# Patient Record
Sex: Male | Born: 1975
Health system: Southern US, Community
[De-identification: ages and names within clinical notes are randomized; demographics above are authoritative.]

## PROBLEM LIST (undated history)

## (undated) ENCOUNTER — Emergency Department (HOSPITAL_COMMUNITY): Payer: BC Managed Care – PPO

## (undated) DIAGNOSIS — G8929 Other chronic pain: Secondary | ICD-10-CM

## (undated) DIAGNOSIS — F419 Anxiety disorder, unspecified: Secondary | ICD-10-CM

## (undated) DIAGNOSIS — F329 Major depressive disorder, single episode, unspecified: Secondary | ICD-10-CM

## (undated) DIAGNOSIS — R519 Headache, unspecified: Secondary | ICD-10-CM

## (undated) DIAGNOSIS — E559 Vitamin D deficiency, unspecified: Secondary | ICD-10-CM

## (undated) DIAGNOSIS — M419 Scoliosis, unspecified: Secondary | ICD-10-CM

## (undated) DIAGNOSIS — M549 Dorsalgia, unspecified: Secondary | ICD-10-CM

## (undated) DIAGNOSIS — G4700 Insomnia, unspecified: Secondary | ICD-10-CM

## (undated) DIAGNOSIS — F32A Depression, unspecified: Secondary | ICD-10-CM

## (undated) DIAGNOSIS — F1910 Other psychoactive substance abuse, uncomplicated: Secondary | ICD-10-CM

## (undated) DIAGNOSIS — R51 Headache: Secondary | ICD-10-CM

## (undated) DIAGNOSIS — U071 COVID-19: Secondary | ICD-10-CM

## (undated) HISTORY — PX: OTHER SURGICAL HISTORY: SHX169

## (undated) HISTORY — DX: Other chronic pain: G89.29

## (undated) HISTORY — DX: Major depressive disorder, single episode, unspecified: F32.9

## (undated) HISTORY — DX: Vitamin D deficiency, unspecified: E55.9

## (undated) HISTORY — DX: Dorsalgia, unspecified: M54.9

## (undated) HISTORY — DX: Insomnia, unspecified: G47.00

## (undated) HISTORY — DX: Scoliosis, unspecified: M41.9

## (undated) HISTORY — DX: COVID-19: U07.1

## (undated) HISTORY — DX: Depression, unspecified: F32.A

## (undated) HISTORY — DX: Anxiety disorder, unspecified: F41.9

## (undated) HISTORY — DX: Headache, unspecified: R51.9

## (undated) HISTORY — DX: Other psychoactive substance abuse, uncomplicated: F19.10

## (undated) HISTORY — DX: Headache: R51

---

## 1998-10-05 ENCOUNTER — Encounter: Admission: RE | Admit: 1998-10-05 | Discharge: 1998-10-28 | Payer: Self-pay | Admitting: Family Medicine

## 2003-06-29 ENCOUNTER — Emergency Department (HOSPITAL_COMMUNITY): Admission: EM | Admit: 2003-06-29 | Discharge: 2003-06-29 | Payer: Self-pay | Admitting: Emergency Medicine

## 2003-06-30 ENCOUNTER — Ambulatory Visit (HOSPITAL_BASED_OUTPATIENT_CLINIC_OR_DEPARTMENT_OTHER): Admission: RE | Admit: 2003-06-30 | Discharge: 2003-06-30 | Payer: Self-pay | Admitting: Orthopedic Surgery

## 2005-07-28 ENCOUNTER — Emergency Department: Payer: Self-pay | Admitting: Emergency Medicine

## 2005-07-29 ENCOUNTER — Emergency Department: Payer: Self-pay | Admitting: Emergency Medicine

## 2006-02-10 ENCOUNTER — Emergency Department: Payer: Self-pay | Admitting: Emergency Medicine

## 2006-12-01 ENCOUNTER — Emergency Department: Payer: Self-pay | Admitting: Emergency Medicine

## 2007-02-22 ENCOUNTER — Emergency Department: Payer: Self-pay | Admitting: Emergency Medicine

## 2007-02-22 ENCOUNTER — Other Ambulatory Visit: Payer: Self-pay

## 2007-06-09 ENCOUNTER — Emergency Department: Payer: Self-pay | Admitting: Emergency Medicine

## 2007-06-14 ENCOUNTER — Emergency Department: Payer: Self-pay | Admitting: Emergency Medicine

## 2007-06-15 ENCOUNTER — Emergency Department (HOSPITAL_COMMUNITY): Admission: EM | Admit: 2007-06-15 | Discharge: 2007-06-15 | Payer: Self-pay | Admitting: Emergency Medicine

## 2007-06-16 ENCOUNTER — Inpatient Hospital Stay (HOSPITAL_COMMUNITY): Admission: AD | Admit: 2007-06-16 | Discharge: 2007-06-19 | Payer: Self-pay | Admitting: Psychiatry

## 2007-06-16 ENCOUNTER — Ambulatory Visit: Payer: Self-pay | Admitting: *Deleted

## 2010-07-18 NOTE — H&P (Signed)
Parker Wilson, Parker Wilson                  ACCOUNT NO.:  [id number]   MEDICAL RECORD NO.:  [id number]          PATIENT TYPE:  IPS   LOCATION:  0405                          FACILITY:  [facility]   PHYSICIAN:  Geoffery Lyons, M.D.      DATE OF BIRTH:  [birth year]   DATE OF ADMISSION:  06/16/2007  DATE OF DISCHARGE:                       PSYCHIATRIC ADMISSION ASSESSMENT   IDENTIFYING INFORMATION:  This is a 35 year old single white male.  This  is an involuntary admission.   HISTORY OF PRESENT ILLNESS:  This patient was referred by [facility] after police found him wandering in the streets.  He is currently sedated and his history is taken from the record.  This  is the first [facility] admission for this 35 year old  with a history of heavy benzodiazepine use.  He was apparently taking  Xanax in amounts that are unclear and then stopped his medications cold  [place] about a week ago, then became unable to sleep, was confused,  having a visual hallucinations and then was found wandering the streets.  He reported to the emergency room physician that he had stopped taking  Xanax 6 mg three times daily and his BuSpar 15 mg b.i.d. on the prior  Sunday, June 08, 2007.  He taper himself down but also reported that he  had been unable to sleep for at least three days and having both visual  and auditory hallucinations, had reported a history of seizures with  withdrawal in the past and apparently two seizures within the last  couple of weeks.  Urine drug screen was positive for  tetrahydrocannabinol, negative for all other substances.  Alcohol level  less than 5.  Other substance abuse is not clear.  Medical history  relatively benign.  Petitioned by his stepfather who said that he had  become paranoid with outbursts of violent behavior.  Here on the unit,  he demonstrated some confusion, inability to maintain boundaries,  wandering into other patients rooms.  Speech  irrelevant, talking about  wearing a bookbag around the unit, thought medications was a rope that  was trying to stab him, but has not been aggressive with staff.   PAST PSYCHIATRIC HISTORY:  No prior [facility] admissions.  No other history is known at this time.   SOCIAL HISTORY:  Unemployed, apparently lost his job two weeks ago.   FAMILY HISTORY:  Not available.   MEDICAL HISTORY:  Primary care Elisea Khader is not known.  Medical problems  are history of seizure.   PREVIOUS MEDICATIONS:  He was known to be on BuSpar and Xanax.  Other  medications not known.   DRUG ALLERGIES:  No known drug allergies.   Physical exam was done in the emergency room and is noted in the record.  This is a slight built, thin white male who currently is sleeping,  arousable and goes immediately back to sleep.  He is 5 feet 7 inches  tall, 110 pounds, temperature 97.4, pulse 75, respirations 20, blood  pressure 109/58.   Diagnostic studies were done in the emergency room.  CBC:  A wbc of 7.6,  hemoglobin 14, hematocrit 40.5 and platelets 195,000.  Chemistry:  Sodium 140, potassium 3.9, chloride 104, carbon dioxide 25, BUN 17,  creatinine 1, random glucose 106.  Hepatic function:  SGOT 58, SGPT 27,  alkaline phosphatase is 56 and total bilirubin 0.9.  Magnesium level  normal at 2.2.  Acetaminophen level less than 10.  Alcohol level less  than 5.  Urine drug screen positive for tetrahydrocannabinol, negative  for all other substances.  TSH 1.885.   MENTAL STATUS EXAM:  This is a sedated gentleman who did require Zyprexa  last evening to settle down.  He is currently on a Librium protocol,  rouses with tactile stimulation and to voice, immediately falls back to  sleep unable to do full mental status exam at this time.   AXIS I:  1. Rule out delirium secondary to a benzodiazepine withdrawal.  2. Rule out benzodiazepine dependence.  3. Polysubstance abuse.  AXIS II:  Deferred.  AXIS III:   Bradycardia not otherwise specified.  AXIS IV:  Deferred.  AXIS V:  Current 35, past year not known.   PLAN:  Voluntarily admit the patient with a goal of a safe detox and to  improve his contact with reality and orientation.  We are going to  recheck a CBC and a CMET on June 20, 2007.  We are forcing fluids at  800 mL each shift and he has been a bit bradycardic of the unit, will do  his vital signs t.i.d.  We were also checking a CIWA score on him every  six hours.  He is on a Librium protocol and received 25 mg q.i.d. today  in tapering dose fashion, along with thiamine 100 mg daily.  We are  going to contact his family and see if we can get some additional input  from them as to their concerns.  He is currently on our intensive care  unit on one-to-one observation.   ESTIMATED LENGTH OF STAY:  Five days.      Margaret A. Scott, N.P.      Geoffery Lyons, M.D.  Electronically Signed    MAS/MEDQ  D:  06/17/2007  T:  06/17/2007  Job:  [id number]

## 2010-07-21 NOTE — Discharge Summary (Signed)
Parker Wilson, Parker Wilson                  ACCOUNT NO.:  [id number]   MEDICAL RECORD NO.:  [id number]          PATIENT TYPE:  IPS   LOCATION:  0505                          FACILITY:  [facility]   PHYSICIAN:  Geoffery Lyons, M.D.      DATE OF BIRTH:  Jan 10, 1976   DATE OF ADMISSION:  06/16/2007  DATE OF DISCHARGE:  06/19/2007                               DISCHARGE SUMMARY   CHIEF COMPLAINT/PRESENT ILLNESS:  This was the first admission to [facility] for this 35 year old single white male, referred by  [facility] after the police found him wandering the streets.  He  has a history of heavy benzodiazepine use.  He was taking Xanax in  amounts that are unclear, then stopped his medication cold [place] about  a week prior to this admission, became unable to sleep, confused, having  visual hallucinations and was found wandering the streets.  He had  stopped taking Xanax 6 mg three times a day and BuSpar 15 twice a day.  He tapered himself down, but has not been able to sleep at least 3 days,  having both visual and auditory hallucinations.  History of seizures  with withdrawal.   ALCOHOL AND DRUG HISTORY:  As already stated had been on Xanax and using  increased amounts.  UDS positive for marijuana.   MEDICAL HISTORY:  History of seizures, possibly withdrawal.  Physical  exam failed to show any acute findings.   LABORATORY WORK:  CBC - white blood cells 7.6, hemoglobin 14, sodium  140, potassium 3.9, BUN 17, creatinine 1, glucose 106, SGOT 58, SGPT 27,  bilirubin 0.9, TSH 1.85.   MENTAL STATUS EXAM:  Reveals a male that initially was quite sedated,  required Zyprexa the evening before to settle down, placed on a Librium  protocol.  Able to wake up, but falls back to sleep.   ADMISSION DIAGNOSES:  Axis I:  Benzodiazepine withdrawal, benzodiazepine  dependence, marijuana abuse.  Axis II: No diagnosis.  Axis III:  Seizure.  Axis IV: Moderate.  Axis V:  Global Assessment of  Functioning on admission 35, highest  Global Assessment of Functioning in the last year 60.   COURSE IN THE HOSPITAL:  He was admitted.  He was started in individual  and group psychotherapy.  He was initially detoxified with Librium.  He  required some Zyprexa, and he was placed on Tegretol.  As already  stated, a 35 year old male who endorsed that he has been dependent on  benzodiazepine.  He went off, felt he could handle it.  He started  hallucinating, got confused, with the feeling that he was going to be  hurt.  He was being seen by physician whose practice was closed.  He was  on benzodiazepine, Xanax 2 mg up to four a day.  He was using more, as  he claimed trying to get his physician to help him with the anxiety and  felt the Xanax was not effective.  He was kept on it and increased the  dose.  In the unit, he was placed on Librium  detox.  He started cleaning  up.  At the time on this evaluation, he is alert, cooperative,  spontaneous, feeling much better.  There was a family session with the  with girlfriend and mother.  He endorsed he was feeling better, clear-  headed.  He has been laid off of work.  He was looking forward to going  back to school.  They were supportive.  On June 19, 2007, he was in  full contact with reality.  There were no active suicidal or homicidal  ideas, no hallucinations, no delusions.  Willing and motivated to pursue  further outpatient treatment.  He was wanting to go home and finish the  detox there as he does not need to stay in the hospital.   DISCHARGE DIAGNOSES:  Axis I:  Benzodiazepine dependence, benzodiazepine  withdrawal, alcohol, marijuana abuse.  Anxiety disorder, not otherwise  specified.  Axis II:  No diagnosis.  Axis III:  Seizure, possibly withdrawal.  Axis IV: Moderate.  Axis V:  Upon discharge, Global Assessment of Functioning 50-55.   Discharged on Librium 25 one three times a day for 4 days, then 1 twice  a day for 4 days,  then 1 daily for 4 days, then discontinue.  Trazodone  100 at bedtime for sleep.  Followup in [facility], M.D.  Electronically Signed     IL/MEDQ  D:  07/17/2007  T:  07/17/2007  Job:  [id number]

## 2010-07-21 NOTE — Op Note (Signed)
[name], Parker Wilson                              ACCOUNT NO.:  [id number]   MEDICAL RECORD NO.:  [id number]                   PATIENT TYPE:  AMB   LOCATION:  DSC                                  FACILITY:  [facility]   PHYSICIAN:  Cindee Salt, M.D.                    DATE OF BIRTH:  06-28-1975   DATE OF PROCEDURE:  06/30/2003  DATE OF DISCHARGE:                                 OPERATIVE REPORT   PREOPERATIVE DIAGNOSIS:  Laceration dorsoradial aspect, left index finger.   POSTOPERATIVE DIAGNOSIS:  Laceration dorsoradial aspect, left index finger.   OPERATION PERFORMED:  Exploration and repair of extensor tendon, left index  finger.   SURGEON:  Cindee Salt, M.D.   ASSISTANTCarolyne Fiscal.   ANESTHESIA:  General.   INDICATIONS FOR PROCEDURE:  The patient is a 35 year old male who suffered a  laceration over the index finger.  He was seen at Urgent Care where they  felt that he had a digital nerve laceration.  The wound was closed.  He was  referred.   DESCRIPTION OF PROCEDURE:  The patient was brought to the operating room  where a general anesthetic was carried out without difficulty.  He was  prepped using DuraPrep in supine position, left arm free.  The sutures were  removed.  The laceration to the extensor tendon identified.  The laceration  was dorsal to the vermilion border.  The digital artery and nerve were  intact.  The extensor tendon was then repaired with figure-of-eight 4-0  Mersilene sutures.  The wound was irrigated and closed with interrupted 5-0  nylon suture.  Sterile compressive dressing and splint were applied.  The  patient tolerated the procedure well and was taken to the recovery room for  observation in satisfactory condition.  He is discharged to home to return  to the [facility] in one week on Vicodin.  He was given Ancef  in the operating room.                                               Cindee Salt, M.D.    Angelique Blonder  D:  06/30/2003  T:   06/30/2003  Job:  [id number]

## 2010-11-28 LAB — DIFFERENTIAL
Basophils Absolute: 0
Basophils Absolute: 0.1
Basophils Relative: 0
Basophils Relative: 1
Eosinophils Absolute: 0
Eosinophils Absolute: 0.4
Eosinophils Relative: 0
Eosinophils Relative: 6 — ABNORMAL HIGH
Lymphocytes Relative: 28
Lymphocytes Relative: 52 — ABNORMAL HIGH
Lymphs Abs: 2.1
Lymphs Abs: 3.2
Monocytes Absolute: 0.5
Monocytes Absolute: 0.4
Monocytes Relative: 7
Monocytes Relative: 7
Neutro Abs: 4.9
Neutro Abs: 2.1
Neutrophils Relative %: 65
Neutrophils Relative %: 34 — ABNORMAL LOW

## 2010-11-28 LAB — POCT I-STAT, CHEM 8
BUN: 17
Calcium, Ion: 1.14
Chloride: 104
Creatinine, Ser: 1
Glucose, Bld: 106 — ABNORMAL HIGH
HCT: 44
Hemoglobin: 15
Potassium: 3.9
Sodium: 140
TCO2: 25

## 2010-11-28 LAB — COMPREHENSIVE METABOLIC PANEL
ALT: 23
AST: 28
Albumin: 3.8
Alkaline Phosphatase: 51
BUN: 18
CO2: 30
Calcium: 9
Chloride: 104
Creatinine, Ser: 1.02
GFR calc Af Amer: >60
GFR calc non Af Amer: >60
Glucose, Bld: 90
Potassium: 4.5
Sodium: 139
Total Bilirubin: 0.5
Total Protein: 6.1

## 2010-11-28 LAB — HEPATIC FUNCTION PANEL
ALT: 27
AST: 58 — ABNORMAL HIGH
Albumin: 4.5
Alkaline Phosphatase: 56
Bilirubin, Direct: 0.2
Indirect Bilirubin: 0.7
Total Bilirubin: 0.9
Total Protein: 7.2

## 2010-11-28 LAB — MAGNESIUM: Magnesium: 2.2

## 2010-11-28 LAB — CBC
HCT: 40.5
HCT: 38.8 — ABNORMAL LOW
Hemoglobin: 14
Hemoglobin: 13
MCHC: 34.5
MCHC: 33.5
MCV: 94.3
MCV: 94.7
Platelets: 195
Platelets: 171
RBC: 4.3
RBC: 4.1 — ABNORMAL LOW
RDW: 12.9
RDW: 12.9
WBC: 7.6
WBC: 6.2

## 2010-11-28 LAB — RAPID URINE DRUG SCREEN, HOSP PERFORMED
Amphetamines: NOT DETECTED
Barbiturates: NOT DETECTED
Benzodiazepines: NOT DETECTED
Cocaine: NOT DETECTED
Opiates: NOT DETECTED
Tetrahydrocannabinol: POSITIVE — AB

## 2010-11-28 LAB — TSH: TSH: 1.885

## 2010-11-28 LAB — ACETAMINOPHEN LEVEL: Acetaminophen (Tylenol), Serum: <10 — ABNORMAL LOW

## 2010-11-28 LAB — ETHANOL: Alcohol, Ethyl (B): <5

## 2012-02-14 ENCOUNTER — Emergency Department: Payer: Self-pay | Admitting: Emergency Medicine

## 2012-02-26 ENCOUNTER — Ambulatory Visit (INDEPENDENT_AMBULATORY_CARE_PROVIDER_SITE_OTHER): Payer: 59 | Admitting: Adult Health

## 2012-02-26 ENCOUNTER — Encounter: Payer: Self-pay | Admitting: Adult Health

## 2012-02-26 VITALS — BP 112/70 | HR 83 | Temp 98.2°F | Resp 14 | Ht 69.0 in | Wt 128.0 lb

## 2012-02-26 DIAGNOSIS — Z0000 Encounter for general adult medical examination without abnormal findings: Secondary | ICD-10-CM

## 2012-02-26 DIAGNOSIS — F411 Generalized anxiety disorder: Secondary | ICD-10-CM

## 2012-02-26 DIAGNOSIS — R5383 Other fatigue: Secondary | ICD-10-CM | POA: Insufficient documentation

## 2012-02-26 DIAGNOSIS — R519 Headache, unspecified: Secondary | ICD-10-CM | POA: Insufficient documentation

## 2012-02-26 DIAGNOSIS — Z720 Tobacco use: Secondary | ICD-10-CM | POA: Insufficient documentation

## 2012-02-26 DIAGNOSIS — F419 Anxiety disorder, unspecified: Secondary | ICD-10-CM | POA: Insufficient documentation

## 2012-02-26 DIAGNOSIS — F17200 Nicotine dependence, unspecified, uncomplicated: Secondary | ICD-10-CM

## 2012-02-26 DIAGNOSIS — R51 Headache: Secondary | ICD-10-CM

## 2012-02-26 DIAGNOSIS — R5381 Other malaise: Secondary | ICD-10-CM

## 2012-02-26 MED ORDER — VARENICLINE TARTRATE 0.5 MG X 11 & 1 MG X 42 PO MISC: ORAL | Status: DC

## 2012-02-26 MED ORDER — ALPRAZOLAM 0.25 MG PO TABS: ORAL_TABLET | ORAL | Status: DC

## 2012-02-26 NOTE — Assessment & Plan Note (Signed)
Requesting assistance to quit smoking. His father and brother have quit smoking with the help of Chantix. Patient is requesting to try this. Ordered started pack of Chantix. Provided information pamphlet and coupon.

## 2012-02-26 NOTE — Assessment & Plan Note (Addendum)
Sleeping 8-12 hours and still feeling tired. Check TSH, B12, Vit D level, CBC, met C.

## 2012-02-26 NOTE — Progress Notes (Signed)
Subjective:    Patient ID: Parker Wilson, male    DOB: 1975-11-30, 36 y.o.   MRN: [id number]  HPI  Parker Wilson is a very pleasant 36 y/o male who is here today to establish care. He has not seen a doctor in > 5 years. Reports relatively good health with some problems with anxiety.   1)  He is having some "anxiety/panic attacks" again and these are occuring without a specific pattern - "I could be sitting down watching a football game or driving down the road". He reports the episodes make his heart race, palms sweaty and chest tight. These episodes subside on their own.  Patient reports having initial problems with anxiety back in 2001 while he was going through a divorce and working extra long hours. He was given xanax at that time but felt it was not really helping him. He has not taken any xanax in approximately 6-7 years. He reports that he feels his outlook is good.  2)  Patient reports having a HA almost every day. He reports that he takes ibuprofen up to 10 tablets daily.  3)  Patient also reports increased fatigue. He sometimes sleeps 9-12 hours and still feels he did not get enough sleep.  4)  Tobacco Abuse - requesting help to quit.   Patient currently does not take any medications except Ibuprofen as noted above.   Review of Systems  Constitutional: Positive for fatigue. Negative for chills, diaphoresis, activity change and appetite change.  HENT: Negative for hearing loss, congestion, rhinorrhea and postnasal drip.   Eyes: Negative for photophobia, discharge and visual disturbance.  Respiratory: Positive for chest tightness. Negative for apnea, cough, shortness of breath and wheezing.   Cardiovascular: Negative for chest pain and leg swelling.       Episodes of tachycardia with anxiety.  Gastrointestinal: Negative for nausea, vomiting, abdominal pain, diarrhea and constipation.  Genitourinary: Negative for dysuria, urgency, frequency and hematuria.  Musculoskeletal: Negative.   Negative for myalgias, back pain and joint swelling.  Neurological: Positive for headaches. Negative for dizziness, weakness, light-headedness and numbness.  Psychiatric/Behavioral: Negative for suicidal ideas, hallucinations, behavioral problems, confusion, sleep disturbance, self-injury and agitation. The patient is nervous/anxious. The patient is not hyperactive.      BP 112/70  Pulse 83  Temp 98.2 F (36.8 C) (Oral)  Resp 14  Ht 5\' 9"  (1.753 m)  Wt 128 lb (58.06 kg)  BMI 18.90 kg/m2  SpO2 99%     Objective:   Physical Exam  Constitutional: He is oriented to person, place, and time. He appears well-developed and well-nourished. No distress.       Pleasant, cooperative, well spoken  HENT:  Head: Normocephalic and atraumatic.  Right Ear: External ear normal.  Left Ear: External ear normal.  Nose: Nose normal.  Mouth/Throat: Oropharynx is clear and moist.  Eyes: Conjunctivae normal and EOM are normal. Pupils are equal, round, and reactive to light. No scleral icterus.  Neck: Normal range of motion. Neck supple. No JVD present. No tracheal deviation present. No thyromegaly present.  Cardiovascular: Normal rate, regular rhythm, normal heart sounds and intact distal pulses.  Exam reveals no gallop.   No murmur heard. Pulmonary/Chest: Effort normal and breath sounds normal. No respiratory distress. He has no wheezes. He has no rales.  Abdominal: Soft. Bowel sounds are normal. He exhibits no mass. There is no tenderness.  Musculoskeletal: Normal range of motion. He exhibits no edema and no tenderness.  Lymphadenopathy:    He has  no cervical adenopathy.  Neurological: He is alert and oriented to person, place, and time. No cranial nerve deficit. He exhibits normal muscle tone. Coordination normal.  Skin: Skin is warm and dry. No rash noted. No erythema.          Multiple tattoos   Psychiatric: He has a normal mood and affect. His behavior is normal. Judgment and thought content  normal.            Assessment & Plan:

## 2012-02-26 NOTE — Patient Instructions (Addendum)
  I have sent a prescription for Chantix to your pharmacy.  Please return for your labs at your earliest convenience.

## 2012-02-26 NOTE — Assessment & Plan Note (Signed)
Suspect rebound HA from excessive use of ibuprofen. Patient taking up to 10 tablets daily. Instructed patient to cut back on the ibuprofen. He can take acetaminophen also for HA. If HA persist, will need f/u to evaluate further.

## 2012-02-26 NOTE — Assessment & Plan Note (Addendum)
Recent onset new anxiety episodes without pattern. Patient has a hx of depression/anxiety (2001) during difficult time in his life (divorce). No current life events that may contribute to anxiety. R/O underlying clinical reason for these new episodes. Check TSH, CBC, Met C. If labs normal, consider starting paxil. Xanax 0.25 mg ordered prn short term use. Patient will return for labs. He reports not doing well with needles and does not want to have it done on Christmas Eve.

## 2012-03-21 ENCOUNTER — Telehealth: Payer: Self-pay | Admitting: Adult Health

## 2012-03-21 NOTE — Telephone Encounter (Signed)
Raquel patient was terminated from his employer and he no longer has any insurance. He said he will contact us if things change.

## 2012-03-21 NOTE — Telephone Encounter (Signed)
Parker Wilson, Please call patient and let him know that he still has labs that need to be drawn. I saw him before Christmas and he did not want to do labs that day and told me he would return. Just find out if he could come in for fasting labs within the next week. Thanks.

## 2012-12-11 ENCOUNTER — Emergency Department: Payer: Self-pay | Admitting: Emergency Medicine

## 2012-12-11 LAB — BASIC METABOLIC PANEL
Anion Gap: 3 — ABNORMAL LOW (ref 7–16)
BUN: 18 mg/dL (ref 7–18)
Calcium, Total: 8.5 mg/dL (ref 8.5–10.1)
Chloride: 105 mmol/L (ref 98–107)
Co2: 27 mmol/L (ref 21–32)
Creatinine: 1.02 mg/dL (ref 0.60–1.30)
EGFR (African American): >60
EGFR (Non-African Amer.): >60
Glucose: 95 mg/dL (ref 65–99)
Osmolality: 272 (ref 275–301)
Potassium: 4.3 mmol/L (ref 3.5–5.1)
Sodium: 135 mmol/L — ABNORMAL LOW (ref 136–145)

## 2012-12-11 LAB — CBC
HCT: 38.5 % — ABNORMAL LOW (ref 40.0–52.0)
HGB: 13.5 g/dL (ref 13.0–18.0)
MCH: 32.7 pg (ref 26.0–34.0)
MCHC: 35 g/dL (ref 32.0–36.0)
MCV: 94 fL (ref 80–100)
Platelet: 256 10*3/uL (ref 150–440)
RBC: 4.11 10*6/uL — ABNORMAL LOW (ref 4.40–5.90)
RDW: 13.1 % (ref 11.5–14.5)
WBC: 13.4 10*3/uL — ABNORMAL HIGH (ref 3.8–10.6)

## 2012-12-11 LAB — TROPONIN I: Troponin-I: <0.02 ng/mL

## 2014-09-13 ENCOUNTER — Encounter: Payer: Self-pay | Admitting: Emergency Medicine

## 2014-09-13 ENCOUNTER — Emergency Department
Admission: EM | Admit: 2014-09-13 | Discharge: 2014-09-13 | Disposition: A | Discharge status: Home / Self Care | Payer: Self-pay | Attending: Emergency Medicine | Admitting: Emergency Medicine

## 2014-09-13 DIAGNOSIS — Z79899 Other long term (current) drug therapy: Secondary | ICD-10-CM | POA: Insufficient documentation

## 2014-09-13 DIAGNOSIS — R5383 Other fatigue: Secondary | ICD-10-CM | POA: Insufficient documentation

## 2014-09-13 DIAGNOSIS — Z720 Tobacco use: Secondary | ICD-10-CM | POA: Insufficient documentation

## 2014-09-13 DIAGNOSIS — R42 Dizziness and giddiness: Secondary | ICD-10-CM | POA: Insufficient documentation

## 2014-09-13 LAB — URINALYSIS COMPLETE WITH MICROSCOPIC (ARMC ONLY)
Bacteria, UA: NONE SEEN
Bilirubin Urine: NEGATIVE
Glucose, UA: NEGATIVE mg/dL
Hgb urine dipstick: NEGATIVE
Leukocytes, UA: NEGATIVE
Nitrite: NEGATIVE
Protein, ur: NEGATIVE mg/dL
Specific Gravity, Urine: 1.029 (ref 1.005–1.030)
pH: 5 (ref 5.0–8.0)

## 2014-09-13 LAB — BASIC METABOLIC PANEL
Anion gap: 8 (ref 5–15)
BUN: 15 mg/dL (ref 6–20)
CO2: 28 mmol/L (ref 22–32)
Calcium: 9.1 mg/dL (ref 8.9–10.3)
Chloride: 102 mmol/L (ref 101–111)
Creatinine, Ser: 1.06 mg/dL (ref 0.61–1.24)
GFR calc Af Amer: >60 mL/min (ref >60)
GFR calc non Af Amer: >60 mL/min (ref >60)
Glucose, Bld: 104 mg/dL — ABNORMAL HIGH (ref 65–99)
Potassium: 4.1 mmol/L (ref 3.5–5.1)
Sodium: 138 mmol/L (ref 135–145)

## 2014-09-13 LAB — HEPATIC FUNCTION PANEL
ALT: 20 U/L (ref 17–63)
AST: 27 U/L (ref 15–41)
Albumin: 4.6 g/dL (ref 3.5–5.0)
Alkaline Phosphatase: 55 U/L (ref 38–126)
Bilirubin, Direct: <0.1 mg/dL — ABNORMAL LOW (ref 0.1–0.5)
Total Bilirubin: 0.5 mg/dL (ref 0.3–1.2)
Total Protein: 7.4 g/dL (ref 6.5–8.1)

## 2014-09-13 LAB — CBC
HCT: 41.7 % (ref 40.0–52.0)
Hemoglobin: 14 g/dL (ref 13.0–18.0)
MCH: 31.8 pg (ref 26.0–34.0)
MCHC: 33.5 g/dL (ref 32.0–36.0)
MCV: 95.1 fL (ref 80.0–100.0)
Platelets: 258 10*3/uL (ref 150–440)
RBC: 4.39 MIL/uL — ABNORMAL LOW (ref 4.40–5.90)
RDW: 13.4 % (ref 11.5–14.5)
WBC: 8.6 10*3/uL (ref 3.8–10.6)

## 2014-09-13 LAB — CK: Total CK: 178 U/L (ref 49–397)

## 2014-09-13 NOTE — ED Provider Notes (Signed)
[facility] Emergency Department Provider Note  ____________________________________________  Time seen: On arrival  I have reviewed the triage vital signs and the nursing notes.   HISTORY  Chief Complaint Fatigue    HPI Myrtis SerCraig E Viscardi is a 39 y.o. male who complains of fatigue which he blames on becoming overheated about 3 weeks agoat work. He notes that occasionally he gets dizzy and he is unsure what causes this. He denies chest pain no shortness of breath. He denies drug use. He does smoke. Occasional alcohol use but not every day. He notes that usually he gets fatigued and dizzy if he is out in the heat. He feels well currently     Past Medical History  Diagnosis Date  . Depression   . Chronic headaches   . Anxiety     Patient Active Problem List   Diagnosis Date Noted  . Tobacco abuse 02/26/2012  . Headache, chronic daily 02/26/2012  . Fatigue 02/26/2012  . Anxiety 02/26/2012    History reviewed. No pertinent past surgical history.  Current Outpatient Rx  Name  Route  Sig  Dispense  Refill  . ALPRAZolam (XANAX) 0.25 MG tablet      Take 1 tablet every 6 hours as needed for anxiety   15 tablet   0   . varenicline (CHANTIX STARTING MONTH PAK) 0.5 MG X 11 & 1 MG X 42 tablet      Take one 0.5 mg tablet by mouth once daily for 3 days, then increase to one 0.5 mg tablet twice daily for 4 days, then increase to one 1 mg tablet twice daily.   53 tablet   0     Allergies Review of patient's allergies indicates no known allergies.  Family History  Problem Relation Age of Onset  . Heart disease Father   . Hypertension Father   . Hypertension Brother   . Cancer Paternal Aunt     breast cancer  . Cancer Paternal Grandfather     Lung cancer    Social History History  Substance Use Topics  . Smoking status: Current Every Day Smoker -- 1.00 packs/day for 15 years  . Smokeless tobacco: Not on file  . Alcohol Use: 7.2 oz/week    12  Cans of beer per week    Review of Systems  Constitutional: Negative for fever. Eyes: Negative for visual changes. ENT: Negative for sore throat Cardiovascular: Negative for chest pain. Respiratory: Negative for shortness of breath. Gastrointestinal: Negative for abdominal pain, vomiting Genitourinary: Negative for dysuria. Musculoskeletal: Negative for back pain. Skin: Negative for rash. Neurological: Negative for headaches or focal weakness. occasional dizziness Psychiatric: No depression. Positive for anxiety  10-point ROS otherwise negative.  ____________________________________________   PHYSICAL EXAM:  VITAL SIGNS: ED Triage Vitals  Enc Vitals Group     BP 09/13/14 1538 135/91 mmHg     Pulse Rate 09/13/14 1538 86     Resp 09/13/14 1538 18     Temp 09/13/14 1538 98.6 F (37 C)     Temp Source 09/13/14 1538 Oral     SpO2 09/13/14 1538 99 %     Weight 09/13/14 1538 130 lb (58.968 kg)     Height 09/13/14 1538 5\' 9"  (1.753 m)     Head Cir --      Peak Flow --      Pain Score --      Pain Loc --      Pain Edu? --  Excl. in GC? --      Constitutional: Alert and oriented. Well appearing and in no distress. Eyes: Conjunctivae are normal.  ENT   Head: Normocephalic and atraumatic.   Mouth/Throat: Mucous membranes are moist. Cardiovascular: Normal rate, regular rhythm. Normal and symmetric distal pulses are present in all extremities. No murmurs, rubs, or gallops. Respiratory: Normal respiratory effort without tachypnea nor retractions. Breath sounds are clear and equal bilaterally.  Gastrointestinal: Soft and non-tender in all quadrants. No distention. There is no CVA tenderness. Genitourinary: deferred Musculoskeletal: Nontender with normal range of motion in all extremities. No lower extremity tenderness nor edema. Neurologic:  Normal speech and language. No gross focal neurologic deficits are appreciated. Skin:  Skin is warm, dry and intact. No rash  noted. Psychiatric: Mood and affect are normal. Patient exhibits appropriate insight and judgment.  ____________________________________________    LABS (pertinent positives/negatives)  Labs Reviewed  BASIC METABOLIC PANEL - Abnormal; Notable for the following:    Glucose, Bld 104 (*)    All other components within normal limits  CBC - Abnormal; Notable for the following:    RBC 4.39 (*)    All other components within normal limits  HEPATIC FUNCTION PANEL - Abnormal; Notable for the following:    Bilirubin, Direct <0.1 (*)    All other components within normal limits  CK  URINALYSIS COMPLETEWITH MICROSCOPIC ([facility] ONLY)    ____________________________________________   EKG  None  ____________________________________________    RADIOLOGY I have personally reviewed any xrays that were ordered on this patient: None  ____________________________________________   PROCEDURES  Procedure(s) performed: none  Critical Care performed: none  ____________________________________________   INITIAL IMPRESSION / ASSESSMENT AND PLAN / ED COURSE  Pertinent labs & imaging results that were available during my care of the patient were reviewed by me and considered in my medical decision making (see chart for details).  Patient well-appearing, benign exam and normal vital signs. Labs are within normal range. Unclear cause of patient's fatigue. I have requested that he follow up with his primary care physician for further workup  ____________________________________________   FINAL CLINICAL IMPRESSION(S) / ED DIAGNOSES  Final diagnoses:  Other fatigue  Dizziness     Jene Every, MD 09/13/14 1714

## 2014-09-13 NOTE — ED Notes (Signed)
States he works outside. And developed weakness ..fatique for about 3-4 weeks   Diarrhea for the past 4 days

## 2014-09-13 NOTE — ED Notes (Signed)
Pt states that he "became overheated" at work X 3 weeks ago, has been feeling fatigued ever since. Pt reports that he gets tired on the way home. Pt alert and oriented X4, active, cooperative, pt in NAD. RR even and unlabored, color WNL.

## 2014-09-13 NOTE — Discharge Instructions (Signed)

## 2014-09-13 NOTE — ED Notes (Signed)
Thinks he became over heated ..light headed

## 2014-11-05 ENCOUNTER — Emergency Department: Payer: Self-pay

## 2014-11-05 ENCOUNTER — Emergency Department
Admission: EM | Admit: 2014-11-05 | Discharge: 2014-11-05 | Disposition: A | Discharge status: Home / Self Care | Payer: Self-pay | Attending: Student | Admitting: Student

## 2014-11-05 DIAGNOSIS — Z79899 Other long term (current) drug therapy: Secondary | ICD-10-CM | POA: Insufficient documentation

## 2014-11-05 DIAGNOSIS — F410 Panic disorder [episodic paroxysmal anxiety] without agoraphobia: Secondary | ICD-10-CM | POA: Insufficient documentation

## 2014-11-05 DIAGNOSIS — F419 Anxiety disorder, unspecified: Secondary | ICD-10-CM

## 2014-11-05 DIAGNOSIS — F329 Major depressive disorder, single episode, unspecified: Secondary | ICD-10-CM | POA: Insufficient documentation

## 2014-11-05 DIAGNOSIS — Z720 Tobacco use: Secondary | ICD-10-CM | POA: Insufficient documentation

## 2014-11-05 LAB — BASIC METABOLIC PANEL
Anion gap: 8 (ref 5–15)
BUN: 16 mg/dL (ref 6–20)
CO2: 28 mmol/L (ref 22–32)
Calcium: 9 mg/dL (ref 8.9–10.3)
Chloride: 100 mmol/L — ABNORMAL LOW (ref 101–111)
Creatinine, Ser: 1.05 mg/dL (ref 0.61–1.24)
GFR calc Af Amer: >60 mL/min (ref >60)
GFR calc non Af Amer: >60 mL/min (ref >60)
Glucose, Bld: 105 mg/dL — ABNORMAL HIGH (ref 65–99)
Potassium: 3.7 mmol/L (ref 3.5–5.1)
Sodium: 136 mmol/L (ref 135–145)

## 2014-11-05 LAB — CBC WITH DIFFERENTIAL/PLATELET
Basophils Absolute: 0.1 10*3/uL (ref 0–0.1)
Basophils Relative: 1 %
Eosinophils Absolute: 0.2 10*3/uL (ref 0–0.7)
Eosinophils Relative: 2 %
HCT: 42.6 % (ref 40.0–52.0)
Hemoglobin: 14.2 g/dL (ref 13.0–18.0)
Lymphocytes Relative: 30 %
Lymphs Abs: 2.6 10*3/uL (ref 1.0–3.6)
MCH: 31.3 pg (ref 26.0–34.0)
MCHC: 33.4 g/dL (ref 32.0–36.0)
MCV: 93.6 fL (ref 80.0–100.0)
Monocytes Absolute: 0.6 10*3/uL (ref 0.2–1.0)
Monocytes Relative: 7 %
Neutro Abs: 5.1 10*3/uL (ref 1.4–6.5)
Neutrophils Relative %: 60 %
Platelets: 263 10*3/uL (ref 150–440)
RBC: 4.55 MIL/uL (ref 4.40–5.90)
RDW: 13.5 % (ref 11.5–14.5)
WBC: 8.6 10*3/uL (ref 3.8–10.6)

## 2014-11-05 LAB — TROPONIN I: Troponin I: <0.03 ng/mL (ref <0.031)

## 2014-11-05 MED ORDER — SODIUM CHLORIDE 0.9 % IV BOLUS (SEPSIS)
1000.0000 mL | Freq: Once | INTRAVENOUS | Status: AC
  Administered 2014-11-05: 1000 mL via INTRAVENOUS

## 2014-11-05 MED ORDER — LORAZEPAM 2 MG/ML IJ SOLN
INTRAMUSCULAR | Status: AC
  Administered 2014-11-05: 1 mg via INTRAVENOUS
  Filled 2014-11-05: qty 1

## 2014-11-05 MED ORDER — LORAZEPAM 2 MG/ML IJ SOLN
1.0000 mg | Freq: Once | INTRAMUSCULAR | Status: AC
  Administered 2014-11-05: 1 mg via INTRAVENOUS

## 2014-11-05 MED ORDER — NICOTINE 10 MG IN INHA: 1.0000 | RESPIRATORY_TRACT | Status: DC | PRN

## 2014-11-05 NOTE — ED Notes (Signed)
Pt states that medication is not helping and he still feels tightness in chest, shakiness and his mind is racing. MD aware. VS stable at this time

## 2014-11-05 NOTE — ED Notes (Signed)
Pt c/o chest tightness since last night, states he has a hx of panic attacks and this feels the same.the patient states he does have new stressors at present.the patient is tearful in triage.Parker Wilson

## 2014-11-05 NOTE — ED Provider Notes (Addendum)
[facility] Emergency Department Provider Note  ____________________________________________  Time seen: Approximately 10:26 AM  I have reviewed the triage vital signs and the nursing notes.   HISTORY  Chief Complaint Panic Attack    HPI Parker Wilson is a 39 y.o. male with history of anxiety and depression who presents for evaluation of panic attacks and severe anxiety. Patient reports that he has been under a lot of stress recently, specifically over the past 2 weeks. He has had panic attacks in the past but not severe as the one that began last night. He reports his panic attacks are associated with intermittent chest tightness, tingling in his hands and legs/feet. No SI, HI or audiovisual hallucinations. Took his Xanax without improvement of symptoms. Current severity of symptoms is 10 out of 10. No modifying factors other than his multiple life stressors. No recent illness including no cough, sneezing, runny nose, fevers. 3 weeks ago he had vomiting and diarrhea but none since.   Past Medical History  Diagnosis Date  . Depression   . Chronic headaches   . Anxiety     Patient Active Problem List   Diagnosis Date Noted  . Tobacco abuse 02/26/2012  . Headache, chronic daily 02/26/2012  . Fatigue 02/26/2012  . Anxiety 02/26/2012    History reviewed. No pertinent past surgical history.  Current Outpatient Rx  Name  Route  Sig  Dispense  Refill  . ibuprofen (ADVIL,MOTRIN) 200 MG tablet   Oral   Take 200 mg by mouth every 6 (six) hours as needed.         . ALPRAZolam (XANAX) 0.25 MG tablet      Take 1 tablet every 6 hours as needed for anxiety   15 tablet   0   . varenicline (CHANTIX STARTING MONTH PAK) 0.5 MG X 11 & 1 MG X 42 tablet      Take one 0.5 mg tablet by mouth once daily for 3 days, then increase to one 0.5 mg tablet twice daily for 4 days, then increase to one 1 mg tablet twice daily.   53 tablet   0     Allergies Review of  patient's allergies indicates no known allergies.  Family History  Problem Relation Age of Onset  . Heart disease Father   . Hypertension Father   . Hypertension Brother   . Cancer Paternal Aunt     breast cancer  . Cancer Paternal Grandfather     Lung cancer    Social History Social History  Substance Use Topics  . Smoking status: Current Every Day Smoker -- 1.00 packs/day for 15 years    Types: Cigarettes  . Smokeless tobacco: None  . Alcohol Use: 7.2 oz/week    12 Cans of beer per week    Review of Systems Constitutional: No fever/chills Eyes: No visual changes. ENT: No sore throat. Cardiovascular: +chest pain. Respiratory: +shortness of breath. Gastrointestinal: No abdominal pain.  No nausea, no vomiting.  No diarrhea.  No constipation. Genitourinary: Negative for dysuria. Musculoskeletal: Negative for back pain. Skin: Negative for rash. Neurological: Negative for headaches, focal weakness or numbness.  10-point ROS otherwise negative.  ____________________________________________   PHYSICAL EXAM:  VITAL SIGNS: ED Triage Vitals  Enc Vitals Group     BP 11/05/14 1021 151/124 mmHg     Pulse Rate 11/05/14 1021 85     Resp 11/05/14 1021 20     Temp 11/05/14 1021 98.2 F (36.8 C)  Temp Source 11/05/14 1021 Oral     SpO2 11/05/14 1021 100 %     Weight 11/05/14 1021 135 lb (61.236 kg)     Height 11/05/14 1021  (1.753 m)     Head Cir --      Peak Flow --      Pain Score 11/05/14 1021 0     Pain Loc --      Pain Edu? --      Excl. in GC? --     Constitutional: Alert and oriented. Crying silently, hesitant to speak, trembling. Eyes: Conjunctivae are normal. PERRL. EOMI. Head: Atraumatic. Nose: No congestion/rhinnorhea. Mouth/Throat: Mucous membranes are moist.  Oropharynx non-erythematous. Neck: No stridor.   Cardiovascular: Normal rate, regular rhythm. Grossly normal heart sounds.  Good peripheral circulation. Respiratory: Normal respiratory  effort.  No retractions. Lungs CTAB. Gastrointestinal: Soft and nontender. No distention. No abdominal bruits. No CVA tenderness. Genitourinary: deferred Musculoskeletal: No lower extremity tenderness nor edema.  No joint effusions. Neurologic:  Normal speech and language. No gross focal neurologic deficits are appreciated. No gait instability. Skin:  Skin is warm, dry and intact. No rash noted. Psychiatric: Mood is depressed and affect is constricted.  ____________________________________________   LABS (all labs ordered are listed, but only abnormal results are displayed)  Labs Reviewed  BASIC METABOLIC PANEL - Abnormal; Notable for the following:    Chloride 100 (*)    Glucose, Bld 105 (*)    All other components within normal limits  CBC WITH DIFFERENTIAL/PLATELET  TROPONIN I   ____________________________________________  EKG  ED ECG REPORT I, Gayla Doss, the attending physician, personally viewed and interpreted this ECG.   Date: 11/05/2014  EKG Time: 10:23  Rate: 79  Rhythm: normal sinus rhythm  Axis: Normal  Intervals:right bundle branch block, incomplete  ST&T Change: No acute ST segment elevation. Unchanged from 12/11/2012. ____________________________________________  RADIOLOGY  CXR FINDINGS: The lungs are clear. No pneumothorax or pleural effusion. Heart size is normal. No focal bony abnormality.  IMPRESSION: No acute disease.  ____________________________________________   PROCEDURES  Procedure(s) performed: None  Critical Care performed: No  ____________________________________________   INITIAL IMPRESSION / ASSESSMENT AND PLAN / ED COURSE  Pertinent labs & imaging results that were available during my care of the patient were reviewed by me and considered in my medical decision making (see chart for details).  Parker Wilson is a 39 y.o. male with history of anxiety and depression who presents for evaluation of panic attacks and severe  anxiety. No SI, HI, AV hallucinations. On exam he is tremulous, tearful, anxious-appearing but also with constricted affect and appears depressed. Vital stable, he is afebrile, he has a benign examination. EKG is reassuring, troponin negative, heart score is 2, doubt ACS as the cause of his chest pain. Chest pain not consistent with acute aortic dissection or PE and I suspect this is all related to his anxiety attacks. Does not meet criteria for involuntary commitment. Will give Ativan, consult behavioral health and psychiatry.  ----------------------------------------- 1:10 PM on 11/05/2014 -----------------------------------------  Patient reports that he no longer wants to stay in the emergency department to wait for psychiatry evaluation. Still no criteria for commitment and he is free to leave. We'll discharge with instructions to follow up with [facility], return precautions. He and mother at bedside are comfortable with discharge plan. ____________________________________________   FINAL CLINICAL IMPRESSION(S) / ED DIAGNOSES  Final diagnoses:  Anxiety  Panic attacks      Gayla Doss,  MD 11/05/14 1311  Gayla Doss, MD 11/05/14 [id number]

## 2016-02-22 DIAGNOSIS — J0140 Acute pansinusitis, unspecified: Secondary | ICD-10-CM | POA: Diagnosis not present

## 2016-02-22 DIAGNOSIS — J00 Acute nasopharyngitis [common cold]: Secondary | ICD-10-CM | POA: Diagnosis not present

## 2016-02-22 DIAGNOSIS — R05 Cough: Secondary | ICD-10-CM | POA: Diagnosis not present

## 2016-10-14 ENCOUNTER — Emergency Department
Admission: EM | Admit: 2016-10-14 | Discharge: 2016-10-14 | Disposition: A | Discharge status: Home / Self Care | Payer: BLUE CROSS/BLUE SHIELD | Attending: Emergency Medicine | Admitting: Emergency Medicine

## 2016-10-14 ENCOUNTER — Encounter: Payer: Self-pay | Admitting: Emergency Medicine

## 2016-10-14 DIAGNOSIS — F17210 Nicotine dependence, cigarettes, uncomplicated: Secondary | ICD-10-CM | POA: Diagnosis not present

## 2016-10-14 DIAGNOSIS — Y990 Civilian activity done for income or pay: Secondary | ICD-10-CM | POA: Insufficient documentation

## 2016-10-14 DIAGNOSIS — X509XXA Other and unspecified overexertion or strenuous movements or postures, initial encounter: Secondary | ICD-10-CM | POA: Insufficient documentation

## 2016-10-14 DIAGNOSIS — M5432 Sciatica, left side: Secondary | ICD-10-CM | POA: Diagnosis not present

## 2016-10-14 DIAGNOSIS — Y939 Activity, unspecified: Secondary | ICD-10-CM | POA: Diagnosis not present

## 2016-10-14 DIAGNOSIS — Y929 Unspecified place or not applicable: Secondary | ICD-10-CM | POA: Insufficient documentation

## 2016-10-14 DIAGNOSIS — Z79899 Other long term (current) drug therapy: Secondary | ICD-10-CM | POA: Insufficient documentation

## 2016-10-14 DIAGNOSIS — M5442 Lumbago with sciatica, left side: Secondary | ICD-10-CM | POA: Diagnosis not present

## 2016-10-14 DIAGNOSIS — M549 Dorsalgia, unspecified: Secondary | ICD-10-CM | POA: Diagnosis present

## 2016-10-14 MED ORDER — TRAMADOL HCL 50 MG PO TABS: 50.0000 mg | ORAL_TABLET | Freq: Four times a day (QID) | ORAL | 0 refills | Status: DC | PRN

## 2016-10-14 MED ORDER — PREDNISONE 10 MG (21) PO TBPK: ORAL_TABLET | ORAL | 0 refills | Status: DC

## 2016-10-14 MED ORDER — CYCLOBENZAPRINE HCL 10 MG PO TABS: 10.0000 mg | ORAL_TABLET | Freq: Three times a day (TID) | ORAL | 0 refills | Status: DC | PRN

## 2016-10-14 NOTE — ED Notes (Signed)
See triage note  States he developed lower back pain which is radiating into left leg  Ambulates with slight limp to room

## 2016-10-14 NOTE — ED Triage Notes (Signed)
Pt c/o chronic lower back pain that has exacerbated this week at work.  Pain worse when bending over even to put socks on.  ambulatory to check in desk.  No loss bowel or bladder.

## 2016-10-14 NOTE — ED Provider Notes (Signed)
[facility] Emergency Department Provider Note ____________________________________________  Time seen: Approximately 9:27 AM  I have reviewed the triage vital signs and the nursing notes.   HISTORY  Chief Complaint Back Pain    HPI Parker Wilson is a 41 y.o. male who presents to the emergency department for evaluation of back pain. Patient states he has a history of lower back pain. 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 Last week while at work, he noticed a twinge of pain but continued to work. Pain was mild the following 2 days, but on Friday the pain was much worse when he awakened and he was unable to get out of bed or go to work. He states that the pain is consistent with previous with the exception of the intensity. He has taken Tylenol and ibuprofen without any relief.  Past Medical History:  Diagnosis Date  . Anxiety   . Chronic headaches   . Depression     Patient Active Problem List   Diagnosis Date Noted  . Tobacco abuse 02/26/2012  . Headache, chronic daily 02/26/2012  . Fatigue 02/26/2012  . Anxiety 02/26/2012    History reviewed. No pertinent surgical history.  Prior to Admission medications   Medication Sig Start Date End Date Taking? Authorizing Provider  ALPRAZolam Prudy Feeler) 0.25 MG tablet Take 1 tablet every 6 hours as needed for anxiety 02/26/12   Rey, Richarda Overlie, NP  cyclobenzaprine (FLEXERIL) 10 MG tablet Take 1 tablet (10 mg total) by mouth 3 (three) times daily as needed for muscle spasms. 10/14/16   Mystic Labo B, FNP  ibuprofen (ADVIL,MOTRIN) 200 MG tablet Take 200 mg by mouth every 6 (six) hours as needed.    [provider]  predniSONE (STERAPRED UNI-PAK 21 TAB) 10 MG (21) TBPK tablet Take 6 tablets on day 1 Take 5 tablets on day 2 Take 4 tablets on day 3 Take 3 tablets on day 4 Take 2 tablets on day 5 Take 1 tablet on day 6 10/14/16   Viriginia Amendola B, FNP  traMADol (ULTRAM)  50 MG tablet Take 1 tablet (50 mg total) by mouth every 6 (six) hours as needed. 10/14/16   Ayinde Swim, Rulon Eisenmenger B, FNP  varenicline (CHANTIX STARTING MONTH PAK) 0.5 MG X 11 & 1 MG X 42 tablet Take one 0.5 mg tablet by mouth once daily for 3 days, then increase to one 0.5 mg tablet twice daily for 4 days, then increase to one 1 mg tablet twice daily. 02/26/12   Novella Olive, NP    Allergies Patient has no known allergies.  Family History  Problem Relation Age of Onset  . Heart disease Father   . Hypertension Father   . Hypertension Brother   . Cancer Paternal Aunt        breast cancer  . Cancer Paternal Grandfather        Lung cancer    Social History Social History  Substance Use Topics  . Smoking status: Current Every Day Smoker    Packs/day: 1.00    Years: 15.00    Types: Cigarettes  . Smokeless tobacco: Not on file  . Alcohol use 7.2 oz/week    12 Cans of beer per week    Review of Systems Constitutional: Well appearing. Cardiovascular: Negative for change in skin temperature or color. Respiratory: Negative for dyspnea. Musculoskeletal:   Negative for fecal incontinence,  Saddle anesthesia, or urinary retention  Negative for immunosuppression, IV  drug use, or fever  Negative for chronic steroid use   Negative for trauma in the presence of osteoporosis  Negative for age over 21 and trauma.  Negative for constitutional symptoms, or history of cancer.  Negative for pain worse at night.  Negative for focal neurologic deficit, progressive, or disabling symptoms Skin: Negative for rash, lesion, or wound.  Neurological: Positive for burning, tingling, numb, electric, radiating pain in the left lateral by toward the knee.  ____________________________________________   PHYSICAL EXAM:  VITAL SIGNS: ED Triage Vitals  Enc Vitals Group     BP 10/14/16 0910 128/77     Pulse Rate 10/14/16 0910 70     Resp 10/14/16 0910 16     Temp 10/14/16 0910 98.2 F (36.8 C)     Temp  Source 10/14/16 0910 Oral     SpO2 10/14/16 0910 100 %     Weight 10/14/16 0906 135 lb (61.2 kg)     Height 10/14/16 0906 5\' 9"  (1.753 m)     Head Circumference --      Peak Flow --      Pain Score 10/14/16 0905 5     Pain Loc --      Pain Edu? --      Excl. in GC? --     Constitutional: Alert and oriented. Well appearing and in no acute distress. Eyes: Conjunctivae are clear without discharge or drainage.  Head: Atraumatic. Neck: Full, active range of motion. Respiratory: Respirations even and unlabored. Musculoskeletal: Decreased ROM, specifically flexion of the lower back. Strength 5/5 of the lower extremities as tested. Neurologic: Reflexes of the lower extremities are 2+. Positive straight leg raise on the left side. Skin: Atraumatic.  Psychiatric: Behavior and affect are normal.  ____________________________________________   LABS (all labs ordered are listed, but only abnormal results are displayed)  Labs Reviewed - No data to display ____________________________________________  RADIOLOGY  Not indicated ____________________________________________   PROCEDURES  Procedure(s) performed: None  ____________________________________________   INITIAL IMPRESSION / ASSESSMENT AND PLAN / ED COURSE  Parker Wilson is a 41 y.o. male presenting to the emergency department for evaluation and treatment of nontraumatic back pain that started about 5 days ago. This is an acute on chronic pain condition. He will be treated with prednisone, Flexeril, and tramadol.  Patient was advised to follow up with orthopedics in one week if not improving or return to the ER for symptoms that change or worsen if unable to schedule an appointment.  Pertinent labs & imaging results that were available during my care of the patient were reviewed by me and considered in my medical decision making (see chart for details).  _________________________________________   FINAL CLINICAL  IMPRESSION(S) / ED DIAGNOSES  Final diagnoses:  Sciatica of left side    New Prescriptions   CYCLOBENZAPRINE (FLEXERIL) 10 MG TABLET    Take 1 tablet (10 mg total) by mouth 3 (three) times daily as needed for muscle spasms.   PREDNISONE (STERAPRED UNI-PAK 21 TAB) 10 MG (21) TBPK TABLET    Take 6 tablets on day 1 Take 5 tablets on day 2 Take 4 tablets on day 3 Take 3 tablets on day 4 Take 2 tablets on day 5 Take 1 tablet on day 6   TRAMADOL (ULTRAM) 50 MG TABLET    Take 1 tablet (50 mg total) by mouth every 6 (six) hours as needed.    If controlled substance prescribed during this visit, 12 month history viewed on the  [facility] prior to issuing an initial prescription for Schedule II or III opiod.    Chinita Pesterriplett, Shadiamond Koska B, FNP 10/14/16 1015    Minna AntisPaduchowski, Kevin, MD 10/14/16 1442

## 2016-10-14 NOTE — Discharge Instructions (Signed)
Follow-up with the orthopedic doctor for symptoms that are not improving over the week. Return to the emergency department for symptoms that change or worsen if you are unable to schedule an appointment.

## 2017-08-12 DIAGNOSIS — S60561A Insect bite (nonvenomous) of right hand, initial encounter: Secondary | ICD-10-CM | POA: Diagnosis not present

## 2017-08-12 DIAGNOSIS — W57XXXA Bitten or stung by nonvenomous insect and other nonvenomous arthropods, initial encounter: Secondary | ICD-10-CM | POA: Diagnosis not present

## 2017-08-12 DIAGNOSIS — L509 Urticaria, unspecified: Secondary | ICD-10-CM | POA: Diagnosis not present

## 2017-08-12 DIAGNOSIS — L089 Local infection of the skin and subcutaneous tissue, unspecified: Secondary | ICD-10-CM | POA: Diagnosis not present

## 2017-10-08 ENCOUNTER — Encounter: Payer: Self-pay | Admitting: Internal Medicine

## 2017-10-08 ENCOUNTER — Ambulatory Visit: Payer: BLUE CROSS/BLUE SHIELD | Admitting: Internal Medicine

## 2017-10-08 VITALS — BP 136/70 | HR 67 | Temp 98.4°F | Ht 70.0 in | Wt 123.8 lb

## 2017-10-08 DIAGNOSIS — Z13220 Encounter for screening for lipoid disorders: Secondary | ICD-10-CM

## 2017-10-08 DIAGNOSIS — M545 Low back pain, unspecified: Secondary | ICD-10-CM | POA: Insufficient documentation

## 2017-10-08 DIAGNOSIS — F419 Anxiety disorder, unspecified: Secondary | ICD-10-CM | POA: Diagnosis not present

## 2017-10-08 DIAGNOSIS — F5104 Psychophysiologic insomnia: Secondary | ICD-10-CM | POA: Insufficient documentation

## 2017-10-08 DIAGNOSIS — E559 Vitamin D deficiency, unspecified: Secondary | ICD-10-CM

## 2017-10-08 DIAGNOSIS — F339 Major depressive disorder, recurrent, unspecified: Secondary | ICD-10-CM | POA: Insufficient documentation

## 2017-10-08 DIAGNOSIS — Z113 Encounter for screening for infections with a predominantly sexual mode of transmission: Secondary | ICD-10-CM

## 2017-10-08 DIAGNOSIS — G4700 Insomnia, unspecified: Secondary | ICD-10-CM

## 2017-10-08 DIAGNOSIS — M2550 Pain in unspecified joint: Secondary | ICD-10-CM

## 2017-10-08 DIAGNOSIS — Z1329 Encounter for screening for other suspected endocrine disorder: Secondary | ICD-10-CM

## 2017-10-08 DIAGNOSIS — G8929 Other chronic pain: Secondary | ICD-10-CM

## 2017-10-08 DIAGNOSIS — Z720 Tobacco use: Secondary | ICD-10-CM

## 2017-10-08 DIAGNOSIS — Z0184 Encounter for antibody response examination: Secondary | ICD-10-CM

## 2017-10-08 DIAGNOSIS — F329 Major depressive disorder, single episode, unspecified: Secondary | ICD-10-CM

## 2017-10-08 DIAGNOSIS — Z1389 Encounter for screening for other disorder: Secondary | ICD-10-CM

## 2017-10-08 DIAGNOSIS — F32A Depression, unspecified: Secondary | ICD-10-CM

## 2017-10-08 MED ORDER — CLONAZEPAM 0.5 MG PO TABS: 0.5000 mg | ORAL_TABLET | Freq: Every evening | ORAL | 1 refills | Status: DC | PRN

## 2017-10-08 MED ORDER — CYCLOBENZAPRINE HCL 10 MG PO TABS: 5.0000 mg | ORAL_TABLET | Freq: Every evening | ORAL | 1 refills | Status: DC | PRN

## 2017-10-08 NOTE — Progress Notes (Signed)
Chief Complaint  Patient presents with  . Establish Care   NP  1. C/o chronic low back pain since injured at 42 y.o pain after lifting heavy concrete 1/10 tried steroids, flexeril helped in the past last flare was 8-9 month ago  2. Insomnia/anxiety/depression. Sleep is big problem only sleeping 4 hours at time and tried xanax, trazadone, restoril, ambien, melatonin in the past with former PCP Dr. Bernita Buffy   Review of Systems  Constitutional: Negative for weight loss.  HENT: Negative for hearing loss.   Eyes: Negative for blurred vision.  Respiratory: Negative for shortness of breath.   Cardiovascular: Negative for chest pain.  Gastrointestinal: Negative for abdominal pain.  Musculoskeletal: Positive for back pain.  Skin: Positive for rash.       Left neck   Neurological: Positive for headaches.  Psychiatric/Behavioral: Positive for depression. The patient is nervous/anxious and has insomnia.    Past Medical History:  Diagnosis Date  . Anxiety   . Chronic headaches   . Depression    No past surgical history on file. Family History  Problem Relation Age of Onset  . Heart disease Father   . Hypertension Father   . Hypertension Brother   . Cancer Paternal Aunt        breast cancer  . Cancer Paternal Grandfather        Lung cancer   Social History   Socioeconomic History  . Marital status: Divorced    Spouse name: Not on file  . Number of children: Not on file  . Years of education: Not on file  . Highest education level: Not on file  Occupational History  . Not on file  Social Needs  . Financial resource strain: Not on file  . Food insecurity:    Worry: Not on file    Inability: Not on file  . Transportation needs:    Medical: Not on file    Non-medical: Not on file  Tobacco Use  . Smoking status: Current Every Day Smoker    Packs/day: 1.00    Years: 15.00    Pack years: 15.00    Types: Cigarettes  Substance and Sexual Activity  . Alcohol use: Yes   Alcohol/week: 7.2 oz    Types: 12 Cans of beer per week  . Drug use: Yes    Types: Marijuana    Comment: Occasional use approximately every 2-3 weeks  . Sexual activity: Never  Lifestyle  . Physical activity:    Days per week: Not on file    Minutes per session: Not on file  . Stress: Not on file  Relationships  . Social connections:    Talks on phone: Not on file    Gets together: Not on file    Attends religious service: Not on file    Active member of club or organization: Not on file    Attends meetings of clubs or organizations: Not on file    Relationship status: Not on file  . Intimate partner violence:    Fear of current or ex partner: Not on file    Emotionally abused: Not on file    Physically abused: Not on file    Forced sexual activity: Not on file  Other Topics Concern  . Not on file  Social History Narrative  . Not on file   Current Meds  Medication Sig  . ibuprofen (ADVIL,MOTRIN) 200 MG tablet Take 200 mg by mouth every 6 (six) hours as needed.   No Known  Allergies No results found for this or any previous visit (from the past 2160 hour(s)). Objective  Body mass index is 17.76 kg/m. Wt Readings from Last 3 Encounters:  10/08/17 123 lb 12.8 oz (56.2 kg)  10/14/16 135 lb (61.2 kg)  11/05/14 135 lb (61.2 kg)   Temp Readings from Last 3 Encounters:  10/08/17 98.4 F (36.9 C) (Oral)  10/14/16 98.2 F (36.8 C) (Oral)  11/05/14 98.2 F (36.8 C) (Oral)   BP Readings from Last 3 Encounters:  10/08/17 136/70  10/14/16 128/77  11/05/14 (!) 142/78   Pulse Readings from Last 3 Encounters:  10/08/17 67  10/14/16 70  11/05/14 65    Physical Exam  Constitutional: He is oriented to person, place, and time. Vital signs are normal. He appears well-developed and well-nourished. He is cooperative.  HENT:  Head: Normocephalic and atraumatic.  Mouth/Throat: Oropharynx is clear and moist.  Eyes: Pupils are equal, round, and reactive to light. Conjunctivae  are normal.  Cardiovascular: Normal rate, regular rhythm and normal heart sounds.  Pulmonary/Chest: Effort normal and breath sounds normal.  Neurological: He is alert and oriented to person, place, and time. Gait normal.  Skin: Skin is warm, dry and intact.  ?petechiae left neck   Psychiatric: He has a normal mood and affect. His speech is normal and behavior is normal. Judgment and thought content normal. Cognition and memory are normal.  Nursing note and vitals reviewed.   Assessment   1. Chronic low back pain and other joint pain with FH RA mom 2. Insomnia/anxiety/depression  3. HM Plan   1. Prn flexeril and consider Xray low back with flares in future  2. Prn klonopin 0.5 mg qhs prn see hpi Other consider lunesta if this does not work  3. Sometimes get flu shot  sch Tdap  Check MMR and hep B status  Std and fasting lab check  rec smoking cessation   Provider: Dr. Olivia Mackie McLean-Scocuzza-Internal Medicine

## 2017-10-08 NOTE — Progress Notes (Signed)
Pre visit review using our clinic review tool, if applicable. No additional management support is needed unless otherwise documented below in the visit note. 

## 2017-10-08 NOTE — Patient Instructions (Signed)
sch fasting labs tomorrow please, and RN visit for Tdap, f/u in 3-4 months  Tdap/DTaP Vaccine (Diphtheria, Tetanus, and Pertussis): What You Need to Know 1. Why get vaccinated? Diphtheria, tetanus, and pertussis are serious diseases caused by bacteria. Diphtheria and pertussis are spread from person to person. Tetanus enters the body through cuts or wounds. DIPHTHERIA causes a thick covering in the back of the throat.  It can lead to breathing problems, paralysis, heart failure, and even death.  TETANUS (Lockjaw) causes painful tightening of the muscles, usually all over the body.  It can lead to "locking" of the jaw so the victim cannot open his mouth or swallow. Tetanus leads to death in up to 2 out of 10 cases.  PERTUSSIS (Whooping Cough) causes coughing spells so bad that it is hard for infants to eat, drink, or breathe. These spells can last for weeks.  It can lead to pneumonia, seizures (jerking and staring spells), brain damage, and death.  Diphtheria, tetanus, and pertussis vaccine (DTaP) can help prevent these diseases. Most children who are vaccinated with DTaP will be protected throughout childhood. Many more children would get these diseases if we stopped vaccinating. DTaP is a safer version of an older vaccine called DTP. DTP is no longer used in the [place]. 2. Who should get DTaP vaccine and when? Children should get 5 doses of DTaP vaccine, one dose at each of the following ages:  2 months  4 months  6 months  15-18 months  4-6 years  DTaP may be given at the same time as other vaccines. 3. Some children should not get DTaP vaccine or should wait  Children with minor illnesses, such as a cold, may be vaccinated. But children who are moderately or severely ill should usually wait until they recover before getting DTaP vaccine.  Any child who had a life-threatening allergic reaction after a dose of DTaP should not get another dose.  Any child who suffered a  brain or nervous system disease within 7 days after a dose of DTaP should not get another dose.  Talk with your doctor if your child: ? had a seizure or collapsed after a dose of DTaP, ? cried non-stop for 3 hours or more after a dose of DTaP, ? had a fever over 105F after a dose of DTaP. Ask your doctor for more information. Some of these children should not get another dose of pertussis vaccine, but may get a vaccine without pertussis, called DT. 4. Older children and adults DTaP is not licensed for adolescents, adults, or children 37 years of age and older. But older people still need protection. A vaccine called Tdap is similar to DTaP. A single dose of Tdap is recommended for people 11 through 40 years of age. Another vaccine, called Td, protects against tetanus and diphtheria, but not pertussis. It is recommended every 10 years. There are separate Vaccine Information Statements for these vaccines. 5. What are the risks from DTaP vaccine? Getting diphtheria, tetanus, or pertussis disease is much riskier than getting DTaP vaccine. However, a vaccine, like any medicine, is capable of causing serious problems, such as severe allergic reactions. The risk of DTaP vaccine causing serious harm, or death, is extremely small. Mild problems (common)  Fever (up to about 1 child in 4)  Redness or swelling where the shot was given (up to about 1 child in 4)  Soreness or tenderness where the shot was given (up to about 1 child in 4) These problems  occur more often after the 4th and 5th doses of the DTaP series than after earlier doses. Sometimes the 4th or 5th dose of DTaP vaccine is followed by swelling of the entire arm or leg in which the shot was given, lasting 1-7 days (up to about 1 child in 30). Other mild problems include:  Fussiness (up to about 1 child in 3)  Tiredness or poor appetite (up to about 1 child in 10)  Vomiting (up to about 1 child in 50) These problems generally occur 1-3  days after the shot. Moderate problems (uncommon)  Seizure (jerking or staring) (about 1 child out of 14,000)  Non-stop crying, for 3 hours or more (up to about 1 child out of 1,000)  High fever, over 105F (about 1 child out of 16,000) Severe problems (very rare)  Serious allergic reaction (less than 1 out of a million doses)  Several other severe problems have been reported after DTaP vaccine. These include: ? Long-term seizures, coma, or lowered consciousness ? Permanent brain damage. These are so rare it is hard to tell if they are caused by the vaccine. Controlling fever is especially important for children who have had seizures, for any reason. It is also important if another family member has had seizures. You can reduce fever and pain by giving your child an aspirin-free pain reliever when the shot is given, and for the next 24 hours, following the package instructions. 6. What if there is a serious reaction? What should I look for? Look for anything that concerns you, such as signs of a severe allergic reaction, very high fever, or behavior changes. Signs of a severe allergic reaction can include hives, swelling of the face and throat, difficulty breathing, a fast heartbeat, dizziness, and weakness. These would start a few minutes to a few hours after the vaccination. What should I do?  If you think it is a severe allergic reaction or other emergency that can't wait, call 9-1-1 or get the person to the nearest hospital. Otherwise, call your doctor.  Afterward, the reaction should be reported to the Vaccine Adverse Event Reporting System (VAERS). Your doctor might file this report, or you can do it yourself through the VAERS web site at www.vaers.LAgents.no, or by calling 1-719 309 5041. ? VAERS is only for reporting reactions. They do not give medical advice. 7. The National Vaccine Injury Compensation Program The [employer] Vaccine Injury Compensation Program (VICP) is a federal  program that was created to compensate people who may have been injured by certain vaccines. Persons who believe they may have been injured by a vaccine can learn about the program and about filing a claim by calling 1-6475246390 or visiting the VICP website at SpiritualWord.at. 8. How can I learn more?  Ask your doctor.  Call your local or state health department.  Contact the Centers for Disease Control and Prevention (CDC): ? Call 250-087-3478 (1-800-CDC-INFO) or ? Visit CDC's website at PicCapture.uy CDC DTaP Vaccine (Diphtheria, Tetanus, and Pertussis) VIS (07/19/05) This information is not intended to replace advice given to you by your health care provider. Make sure you discuss any questions you have with your health care provider. Document Released: 12/17/2005 Document Revised: 11/10/2015 Document Reviewed: 11/10/2015 Elsevier Interactive Patient Education  2017 [employer].

## 2017-10-09 ENCOUNTER — Other Ambulatory Visit (HOSPITAL_COMMUNITY)
Admission: RE | Admit: 2017-10-09 | Discharge: 2017-10-09 | Disposition: A | Discharge status: Home / Self Care | Payer: BLUE CROSS/BLUE SHIELD | Source: Ambulatory Visit | Attending: Internal Medicine | Admitting: Internal Medicine

## 2017-10-09 ENCOUNTER — Other Ambulatory Visit (INDEPENDENT_AMBULATORY_CARE_PROVIDER_SITE_OTHER): Payer: BLUE CROSS/BLUE SHIELD

## 2017-10-09 ENCOUNTER — Ambulatory Visit (INDEPENDENT_AMBULATORY_CARE_PROVIDER_SITE_OTHER): Payer: BLUE CROSS/BLUE SHIELD | Admitting: *Deleted

## 2017-10-09 DIAGNOSIS — Z13220 Encounter for screening for lipoid disorders: Secondary | ICD-10-CM | POA: Diagnosis not present

## 2017-10-09 DIAGNOSIS — Z23 Encounter for immunization: Secondary | ICD-10-CM

## 2017-10-09 DIAGNOSIS — Z1329 Encounter for screening for other suspected endocrine disorder: Secondary | ICD-10-CM

## 2017-10-09 DIAGNOSIS — Z113 Encounter for screening for infections with a predominantly sexual mode of transmission: Secondary | ICD-10-CM | POA: Diagnosis not present

## 2017-10-09 DIAGNOSIS — Z0184 Encounter for antibody response examination: Secondary | ICD-10-CM

## 2017-10-09 DIAGNOSIS — E559 Vitamin D deficiency, unspecified: Secondary | ICD-10-CM

## 2017-10-09 DIAGNOSIS — M2550 Pain in unspecified joint: Secondary | ICD-10-CM

## 2017-10-09 LAB — TSH: TSH: 0.75 u[IU]/mL (ref 0.35–4.50)

## 2017-10-09 LAB — CBC WITH DIFFERENTIAL/PLATELET
Basophils Absolute: 0.3 10*3/uL — ABNORMAL HIGH (ref 0.0–0.1)
Basophils Relative: 4.2 % — ABNORMAL HIGH (ref 0.0–3.0)
Eosinophils Absolute: 0.4 10*3/uL (ref 0.0–0.7)
Eosinophils Relative: 5.6 % — ABNORMAL HIGH (ref 0.0–5.0)
HCT: 39.2 % (ref 39.0–52.0)
Hemoglobin: 13.4 g/dL (ref 13.0–17.0)
Lymphocytes Relative: 37.2 % (ref 12.0–46.0)
Lymphs Abs: 2.8 10*3/uL (ref 0.7–4.0)
MCHC: 34.3 g/dL (ref 30.0–36.0)
MCV: 97.5 fl (ref 78.0–100.0)
Monocytes Absolute: 0.6 10*3/uL (ref 0.1–1.0)
Monocytes Relative: 8.3 % (ref 3.0–12.0)
Neutro Abs: 3.4 10*3/uL (ref 1.4–7.7)
Neutrophils Relative %: 44.7 % (ref 43.0–77.0)
Platelets: 265 10*3/uL (ref 150.0–400.0)
RBC: 4.02 Mil/uL — ABNORMAL LOW (ref 4.22–5.81)
RDW: 14.3 % (ref 11.5–15.5)
WBC: 7.6 10*3/uL (ref 4.0–10.5)

## 2017-10-09 LAB — LIPID PANEL
Cholesterol: 151 mg/dL (ref 0–200)
HDL: 61.7 mg/dL (ref >39.00)
LDL Cholesterol: 67 mg/dL (ref 0–99)
NonHDL: 89.09
Total CHOL/HDL Ratio: 2
Triglycerides: 108 mg/dL (ref 0.0–149.0)
VLDL: 21.6 mg/dL (ref 0.0–40.0)

## 2017-10-09 LAB — COMPREHENSIVE METABOLIC PANEL
ALT: 11 U/L (ref 0–53)
AST: 18 U/L (ref 0–37)
Albumin: 4.6 g/dL (ref 3.5–5.2)
Alkaline Phosphatase: 53 U/L (ref 39–117)
BUN: 12 mg/dL (ref 6–23)
CO2: 31 mEq/L (ref 19–32)
Calcium: 9.5 mg/dL (ref 8.4–10.5)
Chloride: 101 mEq/L (ref 96–112)
Creatinine, Ser: 0.93 mg/dL (ref 0.40–1.50)
GFR: 95.73 mL/min (ref >60.00)
Glucose, Bld: 101 mg/dL — ABNORMAL HIGH (ref 70–99)
Potassium: 4.3 mEq/L (ref 3.5–5.1)
Sodium: 138 mEq/L (ref 135–145)
Total Bilirubin: 0.5 mg/dL (ref 0.2–1.2)
Total Protein: 7 g/dL (ref 6.0–8.3)

## 2017-10-09 LAB — SEDIMENTATION RATE: Sed Rate: 6 mm/hr (ref 0–15)

## 2017-10-09 LAB — C-REACTIVE PROTEIN: CRP: <0.1 mg/dL — ABNORMAL LOW (ref 0.5–20.0)

## 2017-10-09 LAB — VITAMIN D 25 HYDROXY (VIT D DEFICIENCY, FRACTURES): VITD: 22.99 ng/mL — ABNORMAL LOW (ref 30.00–100.00)

## 2017-10-09 LAB — T4, FREE: Free T4: 0.94 ng/dL (ref 0.60–1.60)

## 2017-10-10 LAB — URINALYSIS, ROUTINE W REFLEX MICROSCOPIC
Bilirubin, UA: NEGATIVE
Glucose, UA: NEGATIVE
Ketones, UA: NEGATIVE
Leukocytes, UA: NEGATIVE
Nitrite, UA: NEGATIVE
Protein, UA: NEGATIVE
RBC, UA: NEGATIVE
Specific Gravity, UA: <=1.005 — AB (ref 1.005–1.030)
Urobilinogen, Ur: 0.2 mg/dL (ref 0.2–1.0)
pH, UA: 6.5 (ref 5.0–7.5)

## 2017-10-10 LAB — URINE CYTOLOGY ANCILLARY ONLY
Chlamydia: NEGATIVE
Neisseria Gonorrhea: NEGATIVE
Trichomonas: NEGATIVE

## 2017-10-13 LAB — HEPATITIS B SURFACE ANTIBODY, QUANTITATIVE: Hepatitis B-Post: <5 m[IU]/mL — ABNORMAL LOW (ref >=10)

## 2017-10-13 LAB — MEASLES/MUMPS/RUBELLA IMMUNITY
Mumps IgG: 56.9 AU/mL
Rubella: 1.33 index
Rubeola IgG: 70.2 AU/mL

## 2017-10-13 LAB — CYCLIC CITRUL PEPTIDE ANTIBODY, IGG: Cyclic Citrullin Peptide Ab: <16 UNITS

## 2017-10-13 LAB — HEPATITIS C ANTIBODY
Hepatitis C Ab: NONREACTIVE
SIGNAL TO CUT-OFF: 0.01 (ref <1.00)

## 2017-10-13 LAB — RHEUMATOID FACTOR: Rhuematoid fact SerPl-aCnc: <14 IU/mL (ref <14)

## 2017-10-13 LAB — HSV 2 ANTIBODY, IGG: HSV 2 Glycoprotein G Ab, IgG: <0.9 index

## 2017-10-13 LAB — ANA: Anti Nuclear Antibody(ANA): NEGATIVE

## 2017-10-13 LAB — HIV ANTIBODY (ROUTINE TESTING W REFLEX): HIV 1&2 Ab, 4th Generation: NONREACTIVE

## 2017-10-13 LAB — HEPATITIS B SURFACE ANTIGEN: Hepatitis B Surface Ag: NONREACTIVE

## 2017-10-13 LAB — RPR: RPR Ser Ql: NONREACTIVE

## 2017-10-13 LAB — HSV 1 ANTIBODY, IGG: HSV 1 Glycoprotein G Ab, IgG: 43 index — ABNORMAL HIGH

## 2017-10-29 ENCOUNTER — Telehealth: Payer: Self-pay | Admitting: Internal Medicine

## 2017-10-29 NOTE — Telephone Encounter (Signed)
Pt given lab results per notes of Dr. Judie GrieveMcLean-Scocuzza on 10/13/17. Pt verbalized understanding. Attempted to give pt further explanation of lab results of being positive for oral herpes but pt not comprehending explanation this time.Pt asking how he obtained the virus, if he can take medication to cure it and also has additional questions. Pt states he does not currently get cold sores.Pt would like to ask Dr. Judie GrieveMcLean Scocuzza more questions regarding being positive for oral herpes and would like a return call.  Pt can be reached at [phone]. Unable to document this information in result note.

## 2017-10-30 NOTE — Telephone Encounter (Signed)
Answered all patients questions  

## 2017-10-30 NOTE — Telephone Encounter (Signed)
Not sure when could have contracted it but been exposed in past  If he does have outbreak no cure but treatment  Millions of people have herpes   TMS

## 2017-12-12 ENCOUNTER — Telehealth: Payer: Self-pay

## 2017-12-12 NOTE — Telephone Encounter (Signed)
Copied from CRM [id number]. Topic: Quick Communication - Rx Refill/Question >> Dec 12, 2017  4:11 PM Percival Spanish wrote: Medication   clonazePAM Scarlette Calico) 0.5 MG tablet    pt has an appt in November   Has the patient contacted their pharmacy  yes    (Preferred Pharmacy    [facility]    Agent: Please be advised that RX refills may take up to 3 business days. We ask that you follow-up with your pharmacy.

## 2017-12-13 ENCOUNTER — Other Ambulatory Visit: Payer: Self-pay | Admitting: Internal Medicine

## 2017-12-13 ENCOUNTER — Telehealth: Payer: Self-pay | Admitting: Internal Medicine

## 2017-12-13 DIAGNOSIS — G4700 Insomnia, unspecified: Secondary | ICD-10-CM

## 2017-12-13 DIAGNOSIS — F419 Anxiety disorder, unspecified: Secondary | ICD-10-CM

## 2017-12-13 MED ORDER — CLONAZEPAM 0.5 MG PO TABS: 0.5000 mg | ORAL_TABLET | Freq: Every evening | ORAL | 2 refills | Status: DC | PRN

## 2017-12-13 NOTE — Telephone Encounter (Signed)
Requesting:Klonopin Contract:none, needs csc UDS:no recent uds Last Visit:8/619 Next Visit:01/08/18 Last Refill:10/08/17 1 refill  Please Advise

## 2017-12-13 NOTE — Telephone Encounter (Signed)
Copied from CRM [id number]. Topic: General - Other >> Dec 13, 2017  2:17 PM Ronney Lion A wrote: Medication: cyclobenzaprine (FLEXERIL) 10 MG tablet  (patient want's to know if can receive refills until his appt in November)  Has the patient contacted their pharmacy? Yes   Preferred Pharmacy (with phone number or street name):   [phone] [place], [place] - [address]  [phone] (Phone) [phone] (Fax)    Agent: Please be advised that RX refills may take up to 3 business days. We ask that you follow-up with your pharmacy.

## 2017-12-17 ENCOUNTER — Other Ambulatory Visit: Payer: Self-pay | Admitting: Internal Medicine

## 2017-12-17 DIAGNOSIS — M545 Low back pain, unspecified: Secondary | ICD-10-CM

## 2017-12-17 DIAGNOSIS — G8929 Other chronic pain: Secondary | ICD-10-CM

## 2017-12-17 MED ORDER — CYCLOBENZAPRINE HCL 10 MG PO TABS: 5.0000 mg | ORAL_TABLET | Freq: Every evening | ORAL | 5 refills | Status: DC | PRN

## 2017-12-18 ENCOUNTER — Telehealth: Payer: Self-pay | Admitting: Internal Medicine

## 2017-12-18 NOTE — Telephone Encounter (Signed)
Patient  Says he usually gets a prednisone script with the flexeril , to help with the back pain.

## 2017-12-18 NOTE — Telephone Encounter (Signed)
Copied from CRM [id number]. Topic: Quick Communication - Rx Refill/Question >> Dec 18, 2017 11:10 AM Parker Wilson B wrote: Medication: prednisone   Pt is unsure of what steroid he uses but he is in tremendous pain and cannot miss out on work; pt is inquiring if a medication could be called in to help w/ back so that he can get up and get moving;  contact pt advise   [facility]  Agent: Please be advised that RX refills may take up to 3 business days. We ask that you follow-up with your pharmacy.

## 2017-12-19 ENCOUNTER — Other Ambulatory Visit: Payer: Self-pay | Admitting: Internal Medicine

## 2017-12-19 DIAGNOSIS — M545 Low back pain, unspecified: Secondary | ICD-10-CM

## 2017-12-19 MED ORDER — PREDNISONE 20 MG PO TABS
20.0000 mg | ORAL_TABLET | Freq: Every day | ORAL | 0 refills | Status: DC
Start: 2017-12-19 — End: 2018-01-28

## 2017-12-19 NOTE — Telephone Encounter (Signed)
If back is flaring typically this needs to be an appt  -does he want me to refer him to PT? -sent prednisone but typically for back flares he must have an office visit  -have pt come in for low back Xray   Flexeril is already sent to pharmacy

## 2017-12-20 NOTE — Telephone Encounter (Signed)
Left detailed message per DPR., PEC may schedule and advise.

## 2017-12-20 NOTE — Telephone Encounter (Signed)
Patient advised of below . Appointment scheduled for 12/26/17 .

## 2017-12-26 ENCOUNTER — Ambulatory Visit: Payer: BLUE CROSS/BLUE SHIELD | Admitting: Internal Medicine

## 2017-12-26 ENCOUNTER — Ambulatory Visit (INDEPENDENT_AMBULATORY_CARE_PROVIDER_SITE_OTHER): Payer: BLUE CROSS/BLUE SHIELD

## 2017-12-26 ENCOUNTER — Encounter: Payer: Self-pay | Admitting: Internal Medicine

## 2017-12-26 VITALS — BP 122/70 | HR 81 | Temp 98.4°F | Resp 14 | Ht 70.0 in | Wt 129.5 lb

## 2017-12-26 DIAGNOSIS — F419 Anxiety disorder, unspecified: Secondary | ICD-10-CM

## 2017-12-26 DIAGNOSIS — M545 Low back pain, unspecified: Secondary | ICD-10-CM

## 2017-12-26 DIAGNOSIS — G8929 Other chronic pain: Secondary | ICD-10-CM

## 2017-12-26 DIAGNOSIS — Z720 Tobacco use: Secondary | ICD-10-CM

## 2017-12-26 DIAGNOSIS — G4700 Insomnia, unspecified: Secondary | ICD-10-CM

## 2017-12-26 DIAGNOSIS — M5442 Lumbago with sciatica, left side: Secondary | ICD-10-CM

## 2017-12-26 DIAGNOSIS — M5416 Radiculopathy, lumbar region: Secondary | ICD-10-CM

## 2017-12-26 DIAGNOSIS — M5441 Lumbago with sciatica, right side: Secondary | ICD-10-CM

## 2017-12-26 DIAGNOSIS — M4186 Other forms of scoliosis, lumbar region: Secondary | ICD-10-CM | POA: Diagnosis not present

## 2017-12-26 NOTE — Patient Instructions (Addendum)
rec take D3 2000-5000 IU daily  Call 1800 QUIT NOW for smoking cessation    Lumbosacral Radiculopathy Lumbosacral radiculopathy is a condition that involves the spinal nerves and nerve roots in the low back and bottom of the spine. The condition develops when these nerves and nerve roots move out of place or become inflamed and cause symptoms. What are the causes? This condition may be caused by:  Pressure from a disk that bulges out of place (herniated disk). A disk is a plate of cartilage that separates bones in the spine.  Disk degeneration.  A narrowing of the bones of the lower back (spinal stenosis).  A tumor.  An infection.  An injury that places sudden pressure on the disks that cushion the bones of your lower spine.  What increases the risk? This condition is more likely to develop in:  Males aged 30-50 years.  Females aged 50-60 years.  People who lift improperly.  People who are overweight or live a sedentary lifestyle.  People who smoke.  People who perform repetitive activities that strain the spine.  What are the signs or symptoms? Symptoms of this condition include:  Pain that goes down from the back into the legs (sciatica). This is the most common symptom. The pain may be worse with sitting, coughing, or sneezing.  Pain and numbness in the arms and legs.  Muscle weakness.  Tingling.  Loss of bladder control or bowel control.  How is this diagnosed? This condition is diagnosed with a physical exam and medical history. If the pain is lasting, you may have tests, such as:  MRI scan.  X-ray.  CT scan.  Myelogram.  Nerve conduction study.  How is this treated? This condition is often treated with:  Hot packs and ice applied to affected areas.  Stretches to improve flexibility.  Exercises to strengthen back muscles.  Physical therapy.  Pain medicine.  A steroid injection in the spine.  In some cases, no treatment is needed. If  the condition is long-lasting (chronic), or if symptoms are severe, treatment may involve surgery or lifestyle changes, such as following a weight loss plan. Follow these instructions at home: Medicines  Take medicines only as directed by your health care provider.  Do not drive or operate heavy machinery while taking pain medicine. Injury care  Apply a heat pack to the injured area as directed by your health care provider.  Apply ice to the affected area: ? Put ice in a plastic bag. ? Place a towel between your skin and the bag. ? Leave the ice on for 20-30 minutes, every 2 hours while you are awake or as needed. Or, leave the ice on for as long as directed by your health care provider. Other Instructions  If you were shown how to do any exercises or stretches, do them as directed by your health care provider.  If your health care provider prescribed a diet or exercise program, follow it as directed.  Keep all follow-up visits as directed by your health care provider. This is important. Contact a health care provider if:  Your pain does not improve over time even when taking pain medicines. Get help right away if:  Your develop severe pain.  Your pain suddenly gets worse.  You develop increasing weakness in your legs.  You lose the ability to control your bladder or bowel.  You have difficulty walking or balancing.  You have a fever. This information is not intended to replace advice given  to you by your health care provider. Make sure you discuss any questions you have with your health care provider. Document Released: 02/19/2005 Document Revised: 07/28/2015 Document Reviewed: 02/15/2014 Elsevier Interactive Patient Education  2018 [employer].   Vitamin D Deficiency Vitamin D deficiency is when your body does not have enough vitamin D. Vitamin D is important to your body for many reasons:  It helps the body to absorb two important minerals, called calcium and  phosphorus.  It plays a role in bone health.  It may help to prevent some diseases, such as diabetes and multiple sclerosis.  It plays a role in muscle function, including heart function.  You can get vitamin D by:  Eating foods that naturally contain vitamin D.  Eating or drinking milk or other dairy products that have vitamin D added to them.  Taking a vitamin D supplement or a multivitamin supplement that contains vitamin D.  Being in the sun. Your body naturally makes vitamin D when your skin is exposed to sunlight. Your body changes the sunlight into a form of the vitamin that the body can use.  If vitamin D deficiency is severe, it can cause a condition in which your bones become soft. In adults, this condition is called osteomalacia. In children, this condition is called rickets. What are the causes? Vitamin D deficiency may be caused by:  Not eating enough foods that contain vitamin D.  Not getting enough sun exposure.  Having certain digestive system diseases that make it difficult for your body to absorb vitamin D. These diseases include Crohn disease, chronic pancreatitis, and cystic fibrosis.  Having a surgery in which a part of the stomach or a part of the small intestine is removed.  Being obese.  Having chronic kidney disease or liver disease.  What increases the risk? This condition is more likely to develop in:  Older people.  People who do not spend much time outdoors.  People who live in a long-term care facility.  People who have had broken bones.  People with weak or thin bones (osteoporosis).  People who have a disease or condition that changes how the body absorbs vitamin D.  People who have dark skin.  People who take certain medicines, such as steroid medicines or certain seizure medicines.  People who are overweight or obese.  What are the signs or symptoms? In mild cases of vitamin D deficiency, there may not be any symptoms. If the  condition is severe, symptoms may include:  Bone pain.  Muscle pain.  Falling often.  Broken bones caused by a minor injury.  How is this diagnosed? This condition is usually diagnosed with a blood test. How is this treated? Treatment for this condition may depend on what caused the condition. Treatment options include:  Taking vitamin D supplements.  Taking a calcium supplement. Your health care provider will suggest what dose is best for you.  Follow these instructions at home:  Take medicines and supplements only as told by your health care provider.  Eat foods that contain vitamin D. Choices include: ? Fortified dairy products, cereals, or juices. Fortified means that vitamin D has been added to the food. Check the label on the package to be sure. ? Fatty fish, such as salmon or trout. ? Eggs. ? Oysters.  Do not use a tanning bed.  Maintain a healthy weight. Lose weight, if needed.  Keep all follow-up visits as told by your health care provider. This is important. Contact a  health care provider if:  Your symptoms do not go away.  You feel like throwing up (nausea) or you throw up (vomit).  You have fewer bowel movements than usual or it is difficult for you to have a bowel movement (constipation). This information is not intended to replace advice given to you by your health care provider. Make sure you discuss any questions you have with your health care provider. Document Released: 05/14/2011 Document Revised: 08/03/2015 Document Reviewed: 07/07/2014 Elsevier Interactive Patient Education  2018 [employer].

## 2017-12-26 NOTE — Progress Notes (Addendum)
Chief Complaint  Patient presents with  . Back Pain   F/u  1. 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 Since flexeril, nsaids and prednisone back pain is 2-3/10 which helped. Back pain so bad caused h/a which is also gone. He currently does want PT 2. Insomnia improved with klonopin 0.5 mg qhs and anxiety improved   Review of Systems  Constitutional: Negative for weight loss.  HENT: Negative for hearing loss.   Musculoskeletal: Positive for joint pain.  Skin: Negative for rash.  Neurological: Negative for headaches.  Psychiatric/Behavioral: Negative for depression.   Past Medical History:  Diagnosis Date  . Anxiety   . Chronic back pain   . Chronic headaches   . Depression   . Insomnia    Past Surgical History:  Procedure Laterality Date  . tubes in ears     age 27 or 42 y.o    Family History  Problem Relation Age of Onset  . Heart disease Father        cabg x 3   . Hypertension Father   . Arthritis Father   . Depression Father   . Hyperlipidemia Father   . Crohn's disease Father   . Hypertension Brother   . Alcohol abuse Brother   . Depression Brother   . Hyperlipidemia Brother   . Cancer Paternal Aunt        breast cancer  . Cancer Paternal Grandfather        Lung cancer  . Alcohol abuse Paternal Grandfather   . Arthritis Paternal Grandfather   . COPD Paternal Grandfather   . Early death Paternal Grandfather   . Heart disease Paternal Grandfather        cabg x 4   . Hyperlipidemia Paternal Grandfather   . Hypertension Paternal Grandfather   . Arthritis Mother        RA  . Arthritis Paternal Grandmother   . Depression Paternal Grandmother    Social History   Socioeconomic History  . Marital status: Divorced    Spouse name: Not on file  . Number of children: Not on file  . Years of education:  Not on file  . Highest education level: Not on file  Occupational History  . Not on file  Social Needs  . Financial resource strain: Not on file  . Food insecurity:    Worry: Not on file    Inability: Not on file  . Transportation needs:    Medical: Not on file    Non-medical: Not on file  Tobacco Use  . Smoking status: Current Every Day Smoker    Packs/day: 1.50    Years: 15.00    Pack years: 22.50    Types: Cigarettes  . Smokeless tobacco: Never Used  . Tobacco comment: x 12-13 years as of 10/08/17   Substance and Sexual Activity  . Alcohol use: Yes    Alcohol/week: 6.0 standard drinks    Types: 6 Cans of beer per week  . Drug use: Yes    Types: Marijuana    Comment: Occasional use approximately every 2-3 weeks  . Sexual activity: Yes  Lifestyle  . Physical activity:    Days per week: Not on file    Minutes per session: Not on file  . Stress: Not on file  Relationships  . Social connections:    Talks on phone: Not on file    Gets together: Not on file    Attends religious service: Not on file    Active member of club or organization: Not on file    Attends meetings of clubs or organizations: Not on file    Relationship status: Not on file  . Intimate partner violence:    Fear of current or ex partner: Not on file    Emotionally abused: Not on file    Physically abused: Not on file    Forced sexual activity: Not on file  Other Topics Concern  . Not on file  Social History Narrative   No guns    Wears seat belt    Safe in relationship    Current Meds  Medication Sig  . clonazePAM (KLONOPIN) 0.5 MG tablet Take 1 tablet (0.5 mg total) by mouth at bedtime as needed for anxiety. And sleep  . cyclobenzaprine (FLEXERIL) 10 MG tablet Take 0.5-1 tablets (5-10 mg total) by mouth at bedtime as needed for muscle spasms.  Marland Kitchen ibuprofen (ADVIL,MOTRIN) 200 MG tablet Take 200 mg by mouth every 6 (six) hours as needed.  . predniSONE (DELTASONE) 20 MG tablet Take 1 tablet (20 mg  total) by mouth daily with breakfast.   No Known Allergies Recent Results (from the past 2160 hour(s))  Urine cytology ancillary only     Status: None   Collection Time: 10/09/17 12:00 AM  Result Value Ref Range   Chlamydia Negative     Comment: Normal Reference Range - Negative   Neisseria gonorrhea Negative     Comment: Normal Reference Range - Negative   Trichomonas Negative     Comment: Normal Reference Range - Negative  HSV 2 antibody, IgG     Status: None   Collection Time: 10/09/17  8:43 AM  Result Value Ref Range   HSV 2 Glycoprotein G Ab, IgG <0.90 index    Comment:                           Index          Interpretation                           -----          --------------                           <0.90          Negative                           0.90-1.09      Equivocal                           >1.09          Positive . This assay utilizes recombinant type-specific antigens to differentiate HSV-1 from HSV-2 infections. A positive result cannot distinguish between recent and past infection. If recent HSV infection is suspected but the results are negative or equivocal, the assay should be repeated in 4-6 weeks. The performance characteristics of the assay have not been established for pediatric populations, immunocompromised patients, or neonatal screening.   Comprehensive metabolic panel     Status: Abnormal   Collection Time: 10/09/17  8:43 AM  Result Value Ref Range   Sodium 138 135 - 145 mEq/L   Potassium 4.3 3.5 - 5.1 mEq/L   Chloride 101 96 - 112 mEq/L   CO2 31 19 - 32 mEq/L   Glucose, Bld 101 (H) 70 - 99 mg/dL   BUN 12 6 - 23 mg/dL   Creatinine, Ser 0.93 0.40 - 1.50 mg/dL   Total Bilirubin 0.5 0.2 - 1.2 mg/dL   Alkaline Phosphatase 53 39 - 117 U/L   AST 18 0 - 37 U/L   ALT 11 0 - 53 U/L   Total Protein 7.0 6.0 - 8.3 g/dL   Albumin 4.6 3.5 - 5.2 g/dL   Calcium 9.5 8.4 - 10.5 mg/dL   GFR 95.73 >60.00 mL/min  CBC with Differential/Platelet      Status: Abnormal   Collection Time: 10/09/17  8:43 AM  Result Value Ref Range   WBC 7.6 4.0 - 10.5 K/uL   RBC 4.02 (L) 4.22 - 5.81 Mil/uL   Hemoglobin 13.4 13.0 - 17.0 g/dL   HCT 39.2 39.0 - 52.0 %   MCV 97.5 78.0 - 100.0 fl   MCHC 34.3 30.0 - 36.0 g/dL   RDW 14.3 11.5 - 15.5 %   Platelets 265.0 150.0 - 400.0 K/uL   Neutrophils Relative % 44.7 43.0 - 77.0 %   Lymphocytes Relative 37.2 12.0 - 46.0 %   Monocytes Relative 8.3 3.0 - 12.0 %   Eosinophils Relative 5.6 (H) 0.0 - 5.0 %   Basophils Relative 4.2 (H) 0.0 - 3.0 %   Neutro Abs 3.4 1.4 - 7.7 K/uL   Lymphs Abs 2.8 0.7 - 4.0 K/uL   Monocytes Absolute 0.6 0.1 - 1.0 K/uL   Eosinophils Absolute 0.4 0.0 - 0.7 K/uL   Basophils Absolute 0.3 (H) 0.0 - 0.1 K/uL  Lipid panel     Status: None   Collection Time: 10/09/17  8:43 AM  Result Value Ref Range   Cholesterol 151 0 - 200 mg/dL    Comment: ATP III Classification       Desirable:  < 200 mg/dL               Borderline High:  200 - 239 mg/dL          High:  > = 240 mg/dL   Triglycerides 108.0 0.0 - 149.0 mg/dL    Comment: Normal:  <150 mg/dLBorderline High:  150 - 199 mg/dL   HDL 61.70 >39.00 mg/dL   VLDL 21.6 0.0 - 40.0 mg/dL   LDL Cholesterol 67 0 - 99 mg/dL   Total CHOL/HDL Ratio 2     Comment:                Men          Women1/2 Average Risk     3.4          3.3Average Risk          5.0          4.42X Average Risk          9.6          7.13X Average Risk          15.0          11.0                       NonHDL 89.09     Comment: NOTE:  Non-HDL goal should be 30 mg/dL higher  than patient's LDL goal (i.e. LDL goal of < 70 mg/dL, would have non-HDL goal of < 100 mg/dL)  TSH     Status: None   Collection Time: 10/09/17  8:43 AM  Result Value Ref Range   TSH 0.75 0.35 - 4.50 uIU/mL  T4, free     Status: None   Collection Time: 10/09/17  8:43 AM  Result Value Ref Range   Free T4 0.94 0.60 - 1.60 ng/dL    Comment: Specimens from patients who are undergoing biotin therapy and /or  ingesting biotin supplements may contain high levels of biotin.  The higher biotin concentration in these specimens interferes with this Free T4 assay.  Specimens that contain high levels  of biotin may cause false high results for this Free T4 assay.  Please interpret results in light of the total clinical presentation of the patient.    Vitamin D (25 hydroxy)     Status: Abnormal   Collection Time: 10/09/17  8:43 AM  Result Value Ref Range   VITD 22.99 (L) 30.00 - 100.00 ng/mL  C-reactive protein     Status: Abnormal   Collection Time: 10/09/17  8:43 AM  Result Value Ref Range   CRP <0.1 (L) 0.5 - 20.0 mg/dL  HSV 1 antibody, IgG     Status: Abnormal   Collection Time: 10/09/17  8:43 AM  Result Value Ref Range   HSV 1 Glycoprotein G Ab, IgG 43.00 (H) index    Comment:                           Index          Interpretation                           -----          --------------                           <0.90          Negative                           0.90-1.09      Equivocal                           >1.09          Positive . This assay utilizes recombinant type-specific antigens to differentiate HSV-1 from HSV-2 infections. A positive result cannot distinguish between recent and past infection. If recent HSV infection is suspected but the results are negative or equivocal, the assay should be repeated in 4-6 weeks. The performance characteristics of the assay have not been established for pediatric populations, immunocompromised patients, or neonatal screening.   Hepatitis B surface antigen     Status: None   Collection Time: 10/09/17  8:43 AM  Result Value Ref Range   Hepatitis B Surface Ag NON-REACTIVE NON-REACTI  HIV antibody (with reflex)     Status: None   Collection Time: 10/09/17  8:43 AM  Result Value Ref Range   HIV 1&2 Ab, 4th Generation NON-REACTIVE NON-REACTI    Comment: HIV-1 antigen and HIV-1/HIV-2 antibodies were not detected. There is no laboratory evidence  of HIV infection. Marland Kitchen PLEASE NOTE: This information has been disclosed to you from records whose confidentiality may  be protected by state law.  If your state requires such protection, then the state law prohibits you from making any further disclosure of the information without the specific written consent of the person to whom it pertains, or as otherwise permitted by law. A general authorization for the release of medical or other information is NOT sufficient for this purpose. . For additional information please refer to http://education.[facility].com/faq/FAQ106 (This link is being provided for informational/ educational purposes only.) . Marland Kitchen The performance of this assay has not been clinically validated in patients less than 43 years old. .   RPR     Status: None   Collection Time: 10/09/17  8:43 AM  Result Value Ref Range   RPR Ser Ql NON-REACTIVE NON-REACTI  Cyclic citrul peptide antibody, IgG     Status: None   Collection Time: 10/09/17  8:43 AM  Result Value Ref Range   Cyclic Citrullin Peptide Ab <16 UNITS    Comment: Reference Range Negative:            <20 Weak Positive:       20-39 Moderate Positive:   40-59 Strong Positive:     >59 .   Rheumatoid Factor     Status: None   Collection Time: 10/09/17  8:43 AM  Result Value Ref Range   Rhuematoid fact SerPl-aCnc <14 <14 IU/mL  Sedimentation rate     Status: None   Collection Time: 10/09/17  8:43 AM  Result Value Ref Range   Sed Rate 6 0 - 15 mm/hr  Antinuclear Antib (ANA)     Status: None   Collection Time: 10/09/17  8:43 AM  Result Value Ref Range   Anti Nuclear Antibody(ANA) NEGATIVE NEGATIVE    Comment: ANA IFA is a first line screen for detecting the presence of up to approximately 150 autoantibodies in various autoimmune diseases. A negative ANA IFA result suggests ANA-associated autoimmune diseases are not present at this time. . Visit Physician FAQs for interpretation of all antibodies in  the [place], prevalence, and association with diseases at http://education.[facility].com/ JSE/GBT517 .   Hepatitis B surface antibody,quantitative     Status: Abnormal   Collection Time: 10/09/17  8:43 AM  Result Value Ref Range   Hepatitis B-Post <5 (L) > OR = 10 mIU/mL    Comment: . Patient does not have immunity to hepatitis B virus. . For additional information, please refer to http://education.[facility].com/faq/FAQ105 (This link is being provided for informational/ educational purposes only).   Hepatitis C antibody     Status: None   Collection Time: 10/09/17  8:43 AM  Result Value Ref Range   Hepatitis C Ab NON-REACTIVE NON-REACTI   SIGNAL TO CUT-OFF 0.01 <1.00    Comment: . HCV antibody was non-reactive. There is no laboratory  evidence of HCV infection. . In most cases, no further action is required. However, if recent HCV exposure is suspected, a test for HCV RNA (test code [phone]) is suggested. . For additional information please refer to http://education.[facility].com/faq/FAQ22v1 (This link is being provided for informational/ educational purposes only.) .   Measles/Mumps/Rubella Immunity     Status: None   Collection Time: 10/09/17  8:43 AM  Result Value Ref Range   Rubeola IgG 70.20 AU/mL    Comment: AU/mL            Interpretation -----            -------------- <25.00           Negative 25.00-29.99  Equivocal >29.99           Positive . A positive result indicates that the patient has antibody to measles virus. It does not differentiate  between an active or past infection. The clinical  diagnosis must be interpreted in conjunction with  clinical signs and symptoms of the patient.    Mumps IgG 56.90 AU/mL    Comment:  AU/mL           Interpretation -------         ---------------- <9.00             Negative 9.00-10.99        Equivocal >10.99            Positive A positive result indicates that the patient has  antibody  to mumps virus. It does not differentiate between an  active or past infection. The clinical diagnosis must be interpreted in conjunction with clinical signs and symptoms of the patient. .    Rubella 1.33 index    Comment:     Index            Interpretation     -----            --------------       <0.90            Not consistent with Immunity     0.90-0.99        Equivocal     > or = 1.00      Consistent with Immunity  . The presence of rubella IgG antibody suggests  immunization or past or current infection with rubella virus.   Urinalysis, Routine w reflex microscopic     Status: Abnormal   Collection Time: 10/09/17  8:43 AM  Result Value Ref Range   Specific Gravity, UA      <=1.005 (A) 1.005 - 1.030   pH, UA 6.5 5.0 - 7.5   Color, UA Yellow Yellow   Appearance Ur Clear Clear   Leukocytes, UA Negative Negative   Protein, UA Negative Negative/Trace   Glucose, UA Negative Negative   Ketones, UA Negative Negative   RBC, UA Negative Negative   Bilirubin, UA Negative Negative   Urobilinogen, Ur 0.2 0.2 - 1.0 mg/dL   Nitrite, UA Negative Negative   Microscopic Examination Comment     Comment: Microscopic not indicated and not performed.   Objective  Body mass index is 18.58 kg/m. Wt Readings from Last 3 Encounters:  12/26/17 129 lb 8 oz (58.7 kg)  10/08/17 123 lb 12.8 oz (56.2 kg)  10/14/16 135 lb (61.2 kg)   Temp Readings from Last 3 Encounters:  12/26/17 98.4 F (36.9 C) (Oral)  10/08/17 98.4 F (36.9 C) (Oral)  10/14/16 98.2 F (36.8 C) (Oral)   BP Readings from Last 3 Encounters:  12/26/17 122/70  10/08/17 136/70  10/14/16 128/77   Pulse Readings from Last 3 Encounters:  12/26/17 81  10/08/17 67  10/14/16 70    Physical Exam  Constitutional: He is oriented to person, place, and time. Vital signs are normal. He appears well-developed. He is cooperative.  HENT:  Head: Normocephalic and atraumatic.  Mouth/Throat: Oropharynx is clear and moist and  mucous membranes are normal.  Cardiovascular: Normal rate, regular rhythm and normal heart sounds.  Pulmonary/Chest: Effort normal and breath sounds normal.  Musculoskeletal:       Lumbar back: He exhibits tenderness.  +str8 leg test b/l legs    Neurological: He is alert and  oriented to person, place, and time. Gait normal.  Skin: Skin is warm, dry and intact.  Psychiatric: He has a normal mood and affect. His speech is normal and behavior is normal. Judgment and thought content normal. Cognition and memory are normal.  Nursing note and vitals reviewed.   Assessment   1. Low back pain  2. Insomnia improved, anxiety improved  3. HM Plan   1. Xray low back today Cont meds  Consider MRI  Declines PT for now  2. Prn klonopin no change in dose  3.  Declines flu shot sch Tdap had 10/09/17 MMR immune, rec hep B vaccine  HSV 1 + rec smoking cessation  rec D3 5000 IU qd   Provider: Dr. Olivia Mackie McLean-Scocuzza-Internal Medicine

## 2017-12-30 ENCOUNTER — Other Ambulatory Visit: Payer: Self-pay | Admitting: Internal Medicine

## 2017-12-30 DIAGNOSIS — M5416 Radiculopathy, lumbar region: Secondary | ICD-10-CM

## 2017-12-30 DIAGNOSIS — M5136 Other intervertebral disc degeneration, lumbar region: Secondary | ICD-10-CM

## 2018-01-08 ENCOUNTER — Ambulatory Visit: Payer: BLUE CROSS/BLUE SHIELD | Admitting: Internal Medicine

## 2018-01-08 ENCOUNTER — Encounter: Payer: Self-pay | Admitting: Internal Medicine

## 2018-01-08 VITALS — BP 128/78 | HR 82 | Temp 98.2°F | Resp 16 | Ht 70.0 in | Wt 127.2 lb

## 2018-01-08 DIAGNOSIS — Z720 Tobacco use: Secondary | ICD-10-CM | POA: Diagnosis not present

## 2018-01-08 DIAGNOSIS — M542 Cervicalgia: Secondary | ICD-10-CM

## 2018-01-08 DIAGNOSIS — G8929 Other chronic pain: Secondary | ICD-10-CM

## 2018-01-08 DIAGNOSIS — M5441 Lumbago with sciatica, right side: Secondary | ICD-10-CM

## 2018-01-08 DIAGNOSIS — M5442 Lumbago with sciatica, left side: Secondary | ICD-10-CM | POA: Diagnosis not present

## 2018-01-08 NOTE — Progress Notes (Signed)
Chief Complaint  Patient presents with  . Follow-up    X-rays for back injury   F/u  1. Chronic low back pain midline and left and right with radiation down legs flare is resolved for now but issue is chronic MRI sch 11/15 3:45 pm Xray mild scoliosis left   Neck pain intermittently mild nothing tried he thinks he may have slept wrong declines Xray today   2. Tobacco abuse x years tried Chantix made him mean and wellbutrin caused GI upset he is not ready to quit    Review of Systems  Musculoskeletal: Positive for back pain and neck pain.   Past Medical History:  Diagnosis Date  . Anxiety   . Chronic back pain   . Chronic headaches   . Depression   . Insomnia    Past Surgical History:  Procedure Laterality Date  . tubes in ears     age 42 or 42 y.o    Family History  Problem Relation Age of Onset  . Heart disease Father        cabg x 3   . Hypertension Father   . Arthritis Father   . Depression Father   . Hyperlipidemia Father   . Crohn's disease Father   . Hypertension Brother   . Alcohol abuse Brother   . Depression Brother   . Hyperlipidemia Brother   . Cancer Paternal Aunt        breast cancer  . Cancer Paternal Grandfather        Lung cancer  . Alcohol abuse Paternal Grandfather   . Arthritis Paternal Grandfather   . COPD Paternal Grandfather   . Early death Paternal Grandfather   . Heart disease Paternal Grandfather        cabg x 4   . Hyperlipidemia Paternal Grandfather   . Hypertension Paternal Grandfather   . Arthritis Mother        RA  . Arthritis Paternal Grandmother   . Depression Paternal Grandmother    Social History   Socioeconomic History  . Marital status: Divorced    Spouse name: Not on file  . Number of children: Not on file  . Years of education: Not on file  . Highest education level: Not on file  Occupational History  . Not on file  Social Needs  . Financial resource strain: Not on file  . Food insecurity:    Worry: Not on  file    Inability: Not on file  . Transportation needs:    Medical: Not on file    Non-medical: Not on file  Tobacco Use  . Smoking status: Current Every Day Smoker    Packs/day: 1.50    Years: 15.00    Pack years: 22.50    Types: Cigarettes  . Smokeless tobacco: Never Used  . Tobacco comment: x 12-13 years as of 10/08/17   Substance and Sexual Activity  . Alcohol use: Yes    Alcohol/week: 6.0 standard drinks    Types: 6 Cans of beer per week  . Drug use: Yes    Types: Marijuana    Comment: Occasional use approximately every 2-3 weeks  . Sexual activity: Yes  Lifestyle  . Physical activity:    Days per week: Not on file    Minutes per session: Not on file  . Stress: Not on file  Relationships  . Social connections:    Talks on phone: Not on file    Gets together: Not on file  Attends religious service: Not on file    Active member of club or organization: Not on file    Attends meetings of clubs or organizations: Not on file    Relationship status: Not on file  . Intimate partner violence:    Fear of current or ex partner: Not on file    Emotionally abused: Not on file    Physically abused: Not on file    Forced sexual activity: Not on file  Other Topics Concern  . Not on file  Social History Narrative   No guns    Wears seat belt    Safe in relationship    Current Meds  Medication Sig  . clonazePAM (KLONOPIN) 0.5 MG tablet Take 1 tablet (0.5 mg total) by mouth at bedtime as needed for anxiety. And sleep  . cyclobenzaprine (FLEXERIL) 10 MG tablet Take 0.5-1 tablets (5-10 mg total) by mouth at bedtime as needed for muscle spasms.  Marland Kitchen ibuprofen (ADVIL,MOTRIN) 200 MG tablet Take 200 mg by mouth every 6 (six) hours as needed.   No Known Allergies No results found for this or any previous visit (from the past 2160 hour(s)). Objective  There is no height or weight on file to calculate BMI. Wt Readings from Last 3 Encounters:  12/26/17 129 lb 8 oz (58.7 kg)  10/08/17  123 lb 12.8 oz (56.2 kg)  10/14/16 135 lb (61.2 kg)   Temp Readings from Last 3 Encounters:  12/26/17 98.4 F (36.9 C) (Oral)  10/08/17 98.4 F (36.9 C) (Oral)  10/14/16 98.2 F (36.8 C) (Oral)   BP Readings from Last 3 Encounters:  12/26/17 122/70  10/08/17 136/70  10/14/16 128/77   Pulse Readings from Last 3 Encounters:  12/26/17 81  10/08/17 67  10/14/16 70    Physical Exam  Constitutional: He is oriented to person, place, and time. Vital signs are normal. He appears well-developed and well-nourished. He is cooperative.  HENT:  Head: Normocephalic and atraumatic.  Mouth/Throat: Oropharynx is clear and moist and mucous membranes are normal.  Eyes: Pupils are equal, round, and reactive to light. Conjunctivae are normal.  Cardiovascular: Normal rate, regular rhythm and normal heart sounds.  Pulmonary/Chest: Effort normal and breath sounds normal.  Musculoskeletal:       Lumbar back: He exhibits no tenderness.  Neurological: He is alert and oriented to person, place, and time. Gait normal.  Skin: Skin is warm, dry and intact.  Psychiatric: He has a normal mood and affect. His speech is normal and behavior is normal. Judgment and thought content normal. Cognition and memory are normal.  Nursing note and vitals reviewed.   Assessment   1. Lumbar radiculopathy mild scoliosis left on Xray, cervicalgia   2. HM Plan   1. MRI 01/17/18 low back  Consider Xray neck hold for now  2.  Declines flu shot Tdap had 10/09/17 MMR immune, rec hep B vaccine  HSV 1 + rec smoking cessationdisc with pt today see HPI chantix worked but side effects and side effects with wellbutrin disc patch and gum toc  rec D3 5000 IU qd   Provider: Dr. Olivia Mackie McLean-Scocuzza-Internal Medicine

## 2018-01-08 NOTE — Patient Instructions (Signed)
Scoliosis Scoliosis is the name given to a spine that curves sideways.Scoliosis can cause twisting of your shoulders, hips, chest, back, and rib cage. What are the causes? The cause of scoliosis is not always known. It may be caused by a birth defect or by a disease that can cause muscular dysfunction and imbalance, such as cerebral palsy and muscular dystrophy. What increases the risk? Having a disease that causes muscle disease or dysfunction. What are the signs or symptoms? Scoliosis often has no signs or symptoms.If they are present, they may include:  Unequal size of one body side compared to the other (asymmetry).  Visible curvature of the spine.  Pain. The pain may limit physical activity.  Shortness of breath.  Bowel or bladder issues.  How is this diagnosed? A skilled health care provider will perform an evaluation. This will involve:  Taking your history.  Performing a physical examination.  Performing a neurological exam to detect nerve or muscle function loss.  Range of motion studies on the spine.  X-rays.  An MRI may also be obtained. How is this treated? Treatment varies depending on the nature, extent, and severity of the disease. If the curvature is not great, you may need only observation. A brace may be used to prevent scoliosis from progressing. A brace may also be needed during growth spurts. Physical therapy may be of benefit. Surgery may be required. Follow these instructions at home:  Your health care provider may suggest exercises to strengthen your muscles. Perform them as directed.  Ask your health care provider before participating in any sports.  If you have been prescribed an orthopedic brace, wear it as instructed by your health care provider. Contact a health care provider if: Your brace causes the skin to become sore (chafe) or is uncomfortable. Get help right away if:  You have back pain that is not relieved by the medicines prescribed  by your health care provider.  Your legs feel weak or you lose function in your legs.  You lose some bowel or bladder control. This information is not intended to replace advice given to you by your health care provider. Make sure you discuss any questions you have with your health care provider. Document Released: 02/17/2000 Document Revised: 07/28/2015 Document Reviewed: 08/25/2015 Elsevier Interactive Patient Education  2018 Elsevier Inc.  

## 2018-01-17 ENCOUNTER — Ambulatory Visit: Payer: BLUE CROSS/BLUE SHIELD

## 2018-01-17 ENCOUNTER — Telehealth: Payer: Self-pay

## 2018-01-17 NOTE — Telephone Encounter (Signed)
Copied from CRM [phone]. Topic: General - Inquiry >> Jan 17, 2018  1:31 PM Mickel BaasMcGee, Demi B, [place] wrote: Reason for CRM: Patient calling and would like to know if there is a place that would do an MRI of his back cheaper than where he was originally sent? States that he was supposed to have an MRI done today and they informed him yesterday that he had to put down a deposit and would be charged $2,379. Patient would like to go somewhere cheaper, if possible. Please advise.

## 2018-01-20 ENCOUNTER — Emergency Department
Admission: EM | Admit: 2018-01-20 | Discharge: 2018-01-20 | Disposition: A | Discharge status: Home / Self Care | Payer: BLUE CROSS/BLUE SHIELD | Attending: Emergency Medicine | Admitting: Emergency Medicine

## 2018-01-20 ENCOUNTER — Other Ambulatory Visit: Payer: Self-pay

## 2018-01-20 ENCOUNTER — Ambulatory Visit: Payer: Self-pay

## 2018-01-20 ENCOUNTER — Telehealth: Payer: Self-pay | Admitting: Internal Medicine

## 2018-01-20 ENCOUNTER — Emergency Department: Payer: BLUE CROSS/BLUE SHIELD

## 2018-01-20 DIAGNOSIS — W19XXXA Unspecified fall, initial encounter: Secondary | ICD-10-CM

## 2018-01-20 DIAGNOSIS — M5441 Lumbago with sciatica, right side: Secondary | ICD-10-CM | POA: Diagnosis not present

## 2018-01-20 DIAGNOSIS — M5442 Lumbago with sciatica, left side: Secondary | ICD-10-CM | POA: Insufficient documentation

## 2018-01-20 DIAGNOSIS — W182XXA Fall in (into) shower or empty bathtub, initial encounter: Secondary | ICD-10-CM | POA: Diagnosis not present

## 2018-01-20 DIAGNOSIS — Y998 Other external cause status: Secondary | ICD-10-CM | POA: Diagnosis not present

## 2018-01-20 DIAGNOSIS — G8929 Other chronic pain: Secondary | ICD-10-CM

## 2018-01-20 DIAGNOSIS — F17210 Nicotine dependence, cigarettes, uncomplicated: Secondary | ICD-10-CM | POA: Diagnosis not present

## 2018-01-20 DIAGNOSIS — M5126 Other intervertebral disc displacement, lumbar region: Secondary | ICD-10-CM | POA: Insufficient documentation

## 2018-01-20 DIAGNOSIS — M545 Low back pain: Secondary | ICD-10-CM | POA: Diagnosis not present

## 2018-01-20 DIAGNOSIS — Y92002 Bathroom of unspecified non-institutional (private) residence single-family (private) house as the place of occurrence of the external cause: Secondary | ICD-10-CM | POA: Diagnosis not present

## 2018-01-20 DIAGNOSIS — M5137 Other intervertebral disc degeneration, lumbosacral region: Secondary | ICD-10-CM

## 2018-01-20 DIAGNOSIS — Y93E1 Activity, personal bathing and showering: Secondary | ICD-10-CM | POA: Diagnosis not present

## 2018-01-20 DIAGNOSIS — S199XXA Unspecified injury of neck, initial encounter: Secondary | ICD-10-CM | POA: Diagnosis not present

## 2018-01-20 MED ORDER — CYCLOBENZAPRINE HCL 5 MG PO TABS: ORAL_TABLET | ORAL | 0 refills | Status: DC

## 2018-01-20 MED ORDER — IBUPROFEN 600 MG PO TABS: 600.0000 mg | ORAL_TABLET | Freq: Four times a day (QID) | ORAL | 0 refills | Status: DC | PRN

## 2018-01-20 MED ORDER — OXYCODONE-ACETAMINOPHEN 5-325 MG PO TABS
1.0000 | ORAL_TABLET | Freq: Once | ORAL | Status: AC
  Administered 2018-01-20: 1 via ORAL
  Filled 2018-01-20: qty 1

## 2018-01-20 MED ORDER — LIDOCAINE 5 % EX PTCH: 1.0000 | MEDICATED_PATCH | CUTANEOUS | 0 refills | Status: DC

## 2018-01-20 MED ORDER — TRAMADOL HCL 50 MG PO TABS: 50.0000 mg | ORAL_TABLET | Freq: Four times a day (QID) | ORAL | 0 refills | Status: DC | PRN

## 2018-01-20 NOTE — ED Notes (Signed)
Pt ambulatory to POV with slow steady gait. VSS. NAD. Discharge instructions, RX and follow up reviewed. All questions and concerns addressed.

## 2018-01-20 NOTE — ED Provider Notes (Signed)
[facility] Emergency Department Provider Note  ____________________________________________  Time seen: Approximately 2:42 PM  I have reviewed the triage vital signs and the nursing notes.   HISTORY  Chief Complaint Back Pain    HPI Parker Wilson is a 42 y.o. male that presents to the emergency department for evaluation of low back pain after slipping in the shower last night.  He states that the water and shampoo on the ground was slippery, which caused him to slip and hit his back on the side of the tub.  This morning he was not able to get up out of bed due to pain and had to role out of bed.  Certain movements call his pain to shoot down the back of both of his legs.  He is not having any difficulty moving his hips, knees, ankle, toes.  He states that it is painful to walk due to pain, not weakness.  He called his son to bring him to the emergency department. Patient states that he called PCP this morning and was told to come to the ER for an MRI. He did not hit his head or lose consciousness.   Patient has had back pain for 20+ years. Patient had an xray 3 weeks ago, which showed mild scoliosis.  He is supposed to have an MRI scheduled but could not afford it.  No bowel or bladder dysfunction or saddle anesthesias.  He denies any sensation changes, weakness.   Past Medical History:  Diagnosis Date  . Anxiety   . Chronic back pain   . Chronic headaches   . Depression   . Insomnia   . Scoliosis     Patient Active Problem List   Diagnosis Date Noted  . Cervicalgia 01/08/2018  . Depression 10/08/2017  . Insomnia 10/08/2017  . Chronic low back pain 10/08/2017  . Tobacco abuse 02/26/2012  . Headache, chronic daily 02/26/2012  . Fatigue 02/26/2012  . Anxiety 02/26/2012    Past Surgical History:  Procedure Laterality Date  . tubes in ears     age 38 or 42 y.o     Prior to Admission medications   Medication Sig Start Date End Date Taking? Authorizing  Provider  clonazePAM (KLONOPIN) 0.5 MG tablet Take 1 tablet (0.5 mg total) by mouth at bedtime as needed for anxiety. And sleep 12/13/17   McLean-Scocuzza, Pasty Spillers, MD  cyclobenzaprine (FLEXERIL) 5 MG tablet Take 1-2 tablets 3 times daily as needed 01/20/18   Enid Derry, PA-C  ibuprofen (ADVIL,MOTRIN) 600 MG tablet Take 1 tablet (600 mg total) by mouth every 6 (six) hours as needed. 01/20/18   Enid Derry, PA-C  lidocaine (LIDODERM) 5 % Place 1 patch onto the skin daily. Remove & Discard patch within 12 hours or as directed by MD 01/20/18   Enid Derry, PA-C  predniSONE (DELTASONE) 20 MG tablet Take 1 tablet (20 mg total) by mouth daily with breakfast. Patient not taking: Reported on 01/08/2018 12/19/17   McLean-Scocuzza, Pasty Spillers, MD  traMADol (ULTRAM) 50 MG tablet Take 1 tablet (50 mg total) by mouth every 6 (six) hours as needed. 01/20/18 01/20/19  Enid Derry, PA-C    Allergies Patient has no known allergies.  Family History  Problem Relation Age of Onset  . Heart disease Father        cabg x 3   . Hypertension Father   . Arthritis Father   . Depression Father   . Hyperlipidemia Father   . Crohn's disease  Father   . Hypertension Brother   . Alcohol abuse Brother   . Depression Brother   . Hyperlipidemia Brother   . Cancer Paternal Aunt        breast cancer  . Cancer Paternal Grandfather        Lung cancer  . Alcohol abuse Paternal Grandfather   . Arthritis Paternal Grandfather   . COPD Paternal Grandfather   . Early death Paternal Grandfather   . Heart disease Paternal Grandfather        cabg x 4   . Hyperlipidemia Paternal Grandfather   . Hypertension Paternal Grandfather   . Arthritis Mother        RA  . Arthritis Paternal Grandmother   . Depression Paternal Grandmother     Social History Social History   Tobacco Use  . Smoking status: Current Every Day Smoker    Packs/day: 1.50    Years: 15.00    Pack years: 22.50    Types: Cigarettes  .  Smokeless tobacco: Never Used  . Tobacco comment: x 12-13 years as of 10/08/17   Substance Use Topics  . Alcohol use: Yes    Alcohol/week: 6.0 standard drinks    Types: 6 Cans of beer per week  . Drug use: Yes    Types: Marijuana    Comment: Occasional use approximately every 2-3 weeks     Review of Systems  Cardiovascular: No chest pain. Respiratory: No SOB. Gastrointestinal: No abdominal pain.  No nausea, no vomiting.  Musculoskeletal: Positive for back pain. Skin: Negative for rash, abrasions, lacerations, ecchymosis. Neurological: Negative for numbness or tingling   ____________________________________________   PHYSICAL EXAM:  VITAL SIGNS: ED Triage Vitals [01/20/18 1349]  Enc Vitals Group     BP 114/86     Pulse Rate 92     Resp 18     Temp 98.1 F (36.7 C)     Temp Source Oral     SpO2 97 %     Weight 135 lb (61.2 kg)     Height 5\' 10"  (1.778 m)     Head Circumference      Peak Flow      Pain Score 10     Pain Loc      Pain Edu?      Excl. in GC?      Constitutional: Alert and oriented. Well appearing and in no acute distress. Eyes: Conjunctivae are normal. PERRL. EOMI. Head: Atraumatic. ENT:      Ears:      Nose: No congestion/rhinnorhea.      Mouth/Throat: Mucous membranes are moist.  Neck: No stridor.  Cardiovascular: Normal rate, regular rhythm.  Good peripheral circulation. Respiratory: Normal respiratory effort without tachypnea or retractions. Lungs CTAB. Good air entry to the bases with no decreased or absent breath sounds. Gastrointestinal: Bowel sounds 4 quadrants. Soft and nontender to palpation. No guarding or rigidity. No palpable masses. No distention. Musculoskeletal: Full range of motion to all extremities. No gross deformities appreciated.  Tenderness to palpation diffusely to lumbar spine and lumbar paraspinal muscles.  Strength equal in lower extremities bilaterally.  Patient is able to perform resisted dorsi and plantarflexion of  both feet.  He is able to move all toes normally.  Sensation intact to lower extremities with light touch bilaterally.  Normal gait.  Patient is up walking around emergency department without assistance. Neurologic:  Normal speech and language. No gross focal neurologic deficits are appreciated.  Skin:  Skin is warm, dry  and intact. No rash noted. Psychiatric: Mood and affect are normal. Speech and behavior are normal. Patient exhibits appropriate insight and judgement.   ____________________________________________   LABS (all labs ordered are listed, but only abnormal results are displayed)  Labs Reviewed - No data to display ____________________________________________  EKG   ____________________________________________  RADIOLOGY Lexine Baton, personally viewed and evaluated these images (plain radiographs) as part of my medical decision making, as well as reviewing the written report by the radiologist.  Mr Lumbar Spine Wo Contrast  Result Date: 01/20/2018 CLINICAL DATA:  42 year old male with progressed chronic low back pain after slip and fall in the shower last night. EXAM: MRI LUMBAR SPINE WITHOUT CONTRAST TECHNIQUE: Multiplanar, multisequence MR imaging of the lumbar spine was performed. No intravenous contrast was administered. COMPARISON:  Lumbar radiographs 12/26/2017. Chest radiographs 12/11/2012. FINDINGS: Segmentation: There are 12 full size ribs on the comparison chest images resulting in vestigial ribs at L1 and lumbarized S1 level. Correlation with radiographs is recommended prior to any operative intervention. Alignment: Preserved lumbar lordosis. There is mild retrolisthesis of L5 on S1. Vertebrae: No marrow edema or evidence of acute osseous abnormality. Visualized bone marrow signal is within normal limits. Intact visible sacrum and SI joints. Conus medullaris and cauda equina: Conus extends to the L1-L2 level. No lower spinal cord or conus signal abnormality.  Paraspinal and other soft tissues: Negative. Disc levels: T12-L1 through L4-L5: Negative discs. Intermittent mild facet hypertrophy. L5-S1: Disc desiccation and disc space loss. Broad-based small right paracentral disc protrusion with annular fissure (series 5, image 9 and series 8, image 30. Disc material in proximity to the descending S1 nerve roots in the lateral recesses, more so the right. No spinal or foraminal stenosis. S1-S2: Mostly lumbarized.  Right side assimilation joint. IMPRESSION: 1. Transitional lumbosacral anatomy. Correlation with radiographs is recommended prior to any operative intervention. 2. Isolated lumbar disc degeneration at L5-S1. Small disc protrusion which could be a source for right greater than left S1 radiculitis. 3. Mild lumbar facet degeneration.  No other neural impingement. Electronically Signed   By: Odessa Fleming M.D.   On: 01/20/2018 16:02    ____________________________________________    PROCEDURES  Procedure(s) performed:    Procedures    Medications  oxyCODONE-acetaminophen (PERCOCET/ROXICET) 5-325 MG per tablet 1 tablet (1 tablet Oral Given 01/20/18 1509)     ____________________________________________   INITIAL IMPRESSION / ASSESSMENT AND PLAN / ED COURSE  Pertinent labs & imaging results that were available during my care of the patient were reviewed by me and considered in my medical decision making (see chart for details).  Review of the [place] CSRS was performed in accordance of the [facility] prior to dispensing any controlled drugs.     Patient presents emergency department for evaluation of chronic back pain that worsened after falling last night.  Exam is overall reassuring.  Patient denies any weakness or sensation changes to the lower extremities bilaterally.  No bowel or bladder dysfunction or saddle anesthesias.  He is walking normally in the emergency department and is standing at the nurses station asking for updates and discharge.    Patient and family are upset when I recommended an x-ray to evaluate for fracture from fall last night. Patient and his family are requesting an MRI.  MRI was ordered per family request.  MRI shows chronic changes, and all results of MRI were discussed with patient and he was given a copy of his results.  He is agreeable with plan to follow-up  with primary care and orthopedics.  He is requesting to go home.  Patient will be discharged home with prescriptions for Flexeril, ibuprofen, Lidoderm, short course of tramadol for acute severe pain. Patient is to follow up with orthopedics and PCP as directed. Patient is given ED precautions to return to the ED for any worsening or new symptoms.     ____________________________________________  FINAL CLINICAL IMPRESSION(S) / ED DIAGNOSES  Final diagnoses:  Disc degeneration, lumbosacral  Protrusion of lumbar intervertebral disc  Chronic bilateral low back pain with bilateral sciatica  Fall, initial encounter      NEW MEDICATIONS STARTED DURING THIS VISIT:  ED Discharge Orders         Ordered    cyclobenzaprine (FLEXERIL) 5 MG tablet     01/20/18 1724    ibuprofen (ADVIL,MOTRIN) 600 MG tablet  Every 6 hours PRN     01/20/18 1724    traMADol (ULTRAM) 50 MG tablet  Every 6 hours PRN     01/20/18 1724    lidocaine (LIDODERM) 5 %  Every 24 hours     01/20/18 1724              This chart was dictated using voice recognition software/Dragon. Despite best efforts to proofread, errors can occur which can change the meaning. Any change was purely unintentional.    Enid DerryWagner, Serina Nichter, PA-C 01/20/18 Elige [id number]    Siadecki, Sebastian, MD 01/28/18 [id number]

## 2018-01-20 NOTE — Telephone Encounter (Signed)
Rec ER or Urgent care   TMS

## 2018-01-20 NOTE — Discharge Instructions (Signed)
Begin tramadol as needed for acute pain.  Please continue Flexeril and ibuprofen.  Follow-up with primary care this week and call orthopedics for an appointment.  Return to the emergency department for worsening symptoms.

## 2018-01-20 NOTE — Telephone Encounter (Signed)
Pt. called to report severe pain in neck, low back and intermittent numbness in both legs.  Stated he slipped and fell in the shower last night.  Reported he can't get up from the floor at this time; said he fell out of bed about 10:30 AM, and has been laying on a heating pad on the floor, since that time.  Denied any cuts/ bruises.  Rating pain at 10/10.   Reported he is able to move his legs, but has severe pain in low back when he does.  Denied any numbness or weakness of upper extremities.  Advised pt. That Triage nurse would recommend calling 911 to get assistance and further evaluation of his symptoms.  The pt. requested that nurse speak to his father.  Advised father of recommendation to call EMS.  Father stated "I don't think you understand; this has been going on for a long while."  The father stated "he can get up; he can move from the floor to the couch."   Father said "he just needs something for pain, so he can get up; maybe he can go to the ER tomorrow".  Father requested to have 8 pain pill ordered.  Called [facility] ; spoke with Huntley DecSara.  Advised of above situation and pt's c/o severe pain.  Per Huntley DecSara, she discussed with Leanora CoverLauren Guse, and it was recommended pt. go to the ER.   Spoke with father.  Advised of recommendation to go to ER.  He stated "you don't know my son's situation, but he doesn't have a lot of money."  Stated "he just needs about 8 pain pills to get this pain controlled." Advised he would need to be evaluated to be prescribed narcotic pain medication.  Offered to give an appt. At the office today to see the Nurse Practitioner.  The father raised voice and stated "how is he supposed to go to the office or the ER if he can't get up!  Are you people heartless!" Advised father that is the reason for recommending to call EMS; explained that the ER can evaluate, order appropriate diagnostic testing, and treat his pain.  The father stated he is going to talk to his son, and he will call back to the  office.                   Reason for Disposition . Numbness of a leg or foot (i.e.., loss of sensation)  Answer Assessment - Initial Assessment Questions 1. MECHANISM: "How did the injury happen?" (Consider the possibility of domestic violence or elder abuse)     Slipped in shower last night  2. ONSET: "When did the injury happen?" (Minutes or hours ago)     Pain in neck, lower back, and legs 3. LOCATION: "What part of the back is injured?"     Neck, lower back 4. SEVERITY: "Can you move the back normally?"     Can't turn or get up from floor; laying on a heating pad; reported he fell out of bed about 1 hr. Ago.  5. PAIN: "Is there any pain?" If so, ask: "How bad is the pain?"   (Scale 1-10; or mild, moderate, severe)     10/10  6. CORD SYMPTOMS: Any weakness or numbness of the arms or legs?"     C/o shooting pains down into bilat. Legs and weak in the legs 7. SIZE: For cuts, bruises, or swelling, ask: "How large is it?" (e.g., inches or centimeters)    Denied cuts,  bruises 8. TETANUS: For any breaks in the skin, ask: "When was the last tetanus booster?"     N/a  9. OTHER SYMPTOMS: "Do you have any other symptoms?" (e.g., abdominal pain, blood in urine)     Denied numbness in upper extremities; c/o numbness and weakness in legs  Protocols used: BACK INJURY-A-AH

## 2018-01-20 NOTE — Telephone Encounter (Signed)
Just a FYI

## 2018-01-20 NOTE — Telephone Encounter (Signed)
Have him go to ED or urgent care  I cant keep giving him narcotic pain for back pain if continues will need to establish with pain clinic  We need MRI as well done asap   TMS

## 2018-01-20 NOTE — Telephone Encounter (Signed)
Patient states that he is in a lot of pain and mentioned that he did something else to his back over the weekend. He can not get out of his bed b/c he is in so much pain. Wants to know what he needs to do about him missing work. States that he can not work today and has missed 3 days prior. Is there any medications he can have to help ease his back and can he get a work note to cover his missed days? I sent his order to [facility] as urgent for them to try to get his MRI soon. I informed him that [facility] will call him today to get this scheduled for him and no later by tomorrow.

## 2018-01-20 NOTE — ED Notes (Signed)
Back from MRI  Awaiting results 

## 2018-01-20 NOTE — ED Notes (Signed)
Pt taken to MRI   Family at bedside..Marland Kitchen

## 2018-01-20 NOTE — Telephone Encounter (Signed)
Patient went to ED today.

## 2018-01-20 NOTE — ED Triage Notes (Signed)
Pt c/o having chronic lower back pain , states he was suppose to have an MRI but was not able to afford it. States he slipped and fell in the shower last night and is having increased lower back pain today.

## 2018-01-20 NOTE — Telephone Encounter (Signed)
Pt states he slipped and fell in the shower last night and cannot move.  Pt is in much pain.  Wanting pain med asap. Pt states he cannot move.  I am going to have him speak with a nurse.

## 2018-01-20 NOTE — ED Notes (Signed)
See triage note  Presents with lower back pain  States slipped in shower last pm  Hx of back pain  Was suppose to have a MRI but not able to afford it  States pain is worse today

## 2018-01-22 DIAGNOSIS — M545 Low back pain: Secondary | ICD-10-CM | POA: Diagnosis not present

## 2018-01-22 DIAGNOSIS — M5416 Radiculopathy, lumbar region: Secondary | ICD-10-CM | POA: Diagnosis not present

## 2018-01-23 ENCOUNTER — Ambulatory Visit: Payer: Self-pay

## 2018-01-23 ENCOUNTER — Telehealth: Payer: Self-pay | Admitting: Internal Medicine

## 2018-01-23 NOTE — Telephone Encounter (Signed)
Pt call very upset and unable to talk stating he was having anxiety attack. He states that he has a lot of stressor's. He has health issues, money issues and is fearful of losing his job because of his health issues. Pt denies suicidal thoughts He does not have a plan. He has no weapons. He drinks alcohol last was a couple days ago. Pt just want to speak with his doctor. He states tha a lot has happened since they last talked. He says she has already prescribed medication for his anxiety. Appointment scheduled next available which is 5 day from now. Care advice to include [facility]  Number. Pt was encouraged to call now. Pt refused to be see in the ED.   Reason for Disposition . Depression symptoms (sadness, hopelessness, decreased energy) interfere with work or school  Answer Assessment - Initial Assessment Questions 1. CONCERN: "What happened that made you call today?"  Panic attack 2. SUICIDE ATTEMPT: "Have you tried to harm yourself recently?"  "Have you ever tried to harm yourself before?"     no 3. RISK OF HARM - SUICIDAL IDEATION:  "Do you ever have thoughts of hurting or killing yourself?"  (e.g., yes, no, no but preoccupation with thoughts about death)   - INTENT:  "Do you have thoughts of hurting or killing yourself right NOW?" (e.g., yes, no, N/A)   - PLAN: "Do you have a specific plan for how you would do this?" (e.g., gun, knife, overdose, no plan, N/A)     no 4. RISK OF HARM - HOMICIDAL IDEATION:  "Do you ever have thoughts of hurting or killing someone else?"  (e.g., yes, no, no but preoccupation with thoughts about death)   - INTENT:  "Do you have thoughts of hurting or killing someone right NOW?" (e.g., yes, no, N/A)   - PLAN: "Do you have a specific plan for how you would do this?" (e.g., gun, knife, no plan, N/A)      no 5. ACCESS: If yes to PLAN, "Do you have access to No?" (e.g., pills stored in bathroom, firearm in house, knife in kitchen)   no 6. SUPPORT: "Who is  with you now?" "Who do you live with?" "Do you have family or friends nearby who you can talk to?"   alone 7. THERAPIST: "Do you have a counselor or therapist? Name?"     Yes  A long time ago 8. STRESSORS: "Has there been any new stress or recent changes in your life?"     Health, money, work. 9. DRUG ABUSE/ALCOHOL: "Do you drink alcohol or use any illegal drugs?"      alcohol 10. OTHER: "Do you have any other health or medical symptoms right now?" (e.g., fever)      no 11. PREGNANCY: "Is there any chance you are pregnant?" "When was your last menstrual period?"       N/A  Protocols used: SUICIDE CONCERNS-A-AH

## 2018-01-23 NOTE — Telephone Encounter (Signed)
Copied from CRM [phone]. Topic: Quick Communication - See Telephone Encounter >> Jan 23, 2018  6:32 PM Jens SomMedley, Jennifer A wrote: CRM for notification. See Telephone encounter for: 01/23/18.  Patient is calling to speak to Dr. French Anaracy McLean-Scocuzza. Patient was having an anxiety attack. He was scheduled an appt on 01/29/18. Patient is not satisfied with this date and time.  Patient was advised that Dr. Shauna Hughracy's medical assistant can call him back. Please advise [phone]

## 2018-01-24 ENCOUNTER — Other Ambulatory Visit: Payer: Self-pay | Admitting: Internal Medicine

## 2018-01-24 DIAGNOSIS — M5416 Radiculopathy, lumbar region: Secondary | ICD-10-CM

## 2018-01-24 DIAGNOSIS — G4700 Insomnia, unspecified: Secondary | ICD-10-CM

## 2018-01-24 DIAGNOSIS — R937 Abnormal findings on diagnostic imaging of other parts of musculoskeletal system: Secondary | ICD-10-CM

## 2018-01-24 DIAGNOSIS — F419 Anxiety disorder, unspecified: Secondary | ICD-10-CM

## 2018-01-24 MED ORDER — CLONAZEPAM 0.5 MG PO TABS: 0.5000 mg | ORAL_TABLET | Freq: Two times a day (BID) | ORAL | 2 refills | Status: DC | PRN

## 2018-01-24 NOTE — Telephone Encounter (Signed)
Spoke with patient he is more anxious due to acute on chronic back pain 2/2 fall 01/20/18 in the shower and abnormal MRI from 01/20/18. He reports his boss is asking him when he is coming back to work has been on 1-2 weeks due to back pain, he is having car trouble and afraid he will be losing his job due to all of this. Due to all of this his anxiety is up and appetite is low and he has been having panic attacks and taking klonopin 0.5 more than the Rx qd prn. He has about 2 days left.   He states someone referred him to a doctor [address] for back but he felt like he was getting the run around with his back issues which is also increasing anxiety.    Anxiety having to take klonopin 0.5 qd more will increase prn to bid prn for now long term need to find strategies to manage anxiety other than medications I.e therapy  For now will try to control back pain and refer urgently to Dr. Yves Dillhasnis will try to sch asap and pt to f/u Monday with our office about scheduling   Disc FMLA paperwork from job if needed ask if out so he is not at risk of losing job   MTS

## 2018-01-24 NOTE — Telephone Encounter (Signed)
Please advise 

## 2018-01-24 NOTE — Telephone Encounter (Signed)
I would like to speak with patient.

## 2018-01-24 NOTE — Telephone Encounter (Signed)
Left message for patient to return call back.  

## 2018-01-27 NOTE — Telephone Encounter (Signed)
He went to ED 01/20/18 and had an MRI lumbar so no need to do that.   Thanks for your help  TMS

## 2018-01-27 NOTE — Telephone Encounter (Signed)
His [facility] was [facility] [address] for which he cancelled this appointment d/t cost. He called very angry about having to cancel his appointment. I told him that I would send his [facility] and would ask for them to schedule as soon as possible, however it was not ordered as urgent or stat so I could not promise that it would be asap. He then started asking what he needed to do regarding his work and his pain. He refused to come in because he could not get up off the floor and was requesting pain meds, Lauren Guse refused to call in pain meds without him being evaluated. His father then called and said that he could not believe how heartless we was because he could not get up but we wanted him to come in to be seen or go to the ER.  He went to the ER and had his MRI done that day. His referral has been sent to Dr. Yves Dillhasnis as urgent, but again, I can not promise that it will be scheduled asap since Dr. Yves Dillhasnis will make that call.

## 2018-01-28 ENCOUNTER — Encounter: Payer: Self-pay | Admitting: Internal Medicine

## 2018-01-28 ENCOUNTER — Ambulatory Visit: Payer: BLUE CROSS/BLUE SHIELD | Admitting: Internal Medicine

## 2018-01-28 VITALS — BP 136/92 | HR 93 | Temp 98.0°F | Ht 70.0 in | Wt 127.0 lb

## 2018-01-28 DIAGNOSIS — M5416 Radiculopathy, lumbar region: Secondary | ICD-10-CM

## 2018-01-28 DIAGNOSIS — F419 Anxiety disorder, unspecified: Secondary | ICD-10-CM

## 2018-01-28 DIAGNOSIS — G4700 Insomnia, unspecified: Secondary | ICD-10-CM

## 2018-01-28 DIAGNOSIS — M5442 Lumbago with sciatica, left side: Secondary | ICD-10-CM

## 2018-01-28 DIAGNOSIS — M5126 Other intervertebral disc displacement, lumbar region: Secondary | ICD-10-CM | POA: Insufficient documentation

## 2018-01-28 DIAGNOSIS — G8929 Other chronic pain: Secondary | ICD-10-CM

## 2018-01-28 DIAGNOSIS — R937 Abnormal findings on diagnostic imaging of other parts of musculoskeletal system: Secondary | ICD-10-CM | POA: Insufficient documentation

## 2018-01-28 DIAGNOSIS — F32A Depression, unspecified: Secondary | ICD-10-CM

## 2018-01-28 DIAGNOSIS — M5441 Lumbago with sciatica, right side: Secondary | ICD-10-CM

## 2018-01-28 DIAGNOSIS — F329 Major depressive disorder, single episode, unspecified: Secondary | ICD-10-CM

## 2018-01-28 MED ORDER — KETOROLAC TROMETHAMINE 60 MG/2ML IM SOLN
60.0000 mg | Freq: Once | INTRAMUSCULAR | Status: AC
  Administered 2018-01-28: 60 mg via INTRAMUSCULAR

## 2018-01-28 MED ORDER — METHYLPREDNISOLONE ACETATE 40 MG/ML IJ SUSP
40.0000 mg | Freq: Once | INTRAMUSCULAR | Status: AC
  Administered 2018-01-28: 40 mg via INTRAMUSCULAR

## 2018-01-28 NOTE — Patient Instructions (Addendum)
We have referred you to Dr. Yves Dillhasnis and given you a shot of Toradol 60 mg x 1 and Depromedrol 40 mg x1 today    Herniated Disk A herniated disk, also called a ruptured disk or slipped disk, occurs when a disk in the spine bulges out too far. Between the bones in the spine (vertebrae), there are oval disks that are made of a soft, spongy center that is surrounded by a tough outer ring. The disks connect your vertebrae, help your spine move, and absorb shocks from your movement. When you have a herniated disk, the spongy center of the disk bulges out or breaks through the outer ring. It can press on a nerve between the vertebrae and cause pain. This can occur anywhere in the back or neck area, but the lower back is most commonly affected. What are the causes? This condition may be caused by:  Age-related wear and tear. The spongy centers of spinal disks tend to shrink and dry out with age, which makes them more likely to herniate.  Sudden injury, such as a strain or sprain.  What increases the risk? Aging is the main risk factor for a herniated disk. Other risk factors include:  Being a man who is 1430-40 years old.  Frequently doing activities that involve heavy lifting, bending, or twisting.  Frequently driving for long hours at a time.  Not getting enough exercise.  Being overweight.  Smoking.  Having a family history of back problems or herniated disks.  Being pregnant or giving birth.  Having poor nutrition.  Being tall.  What are the signs or symptoms? Symptoms may vary depending on where your herniated disk is located.  A herniated disk in the lower back may cause sharp pain in: ? Part of the arm, leg, hip, or buttocks. ? The back of the lower leg (calf). ? The lower back, spreading down through the leg into the foot (sciatica).  A herniated disk in the neck may cause dizziness and vertigo. It may also cause pain or weakness in: ? The neck. ? The shoulder  blades. ? Upper arm, forearm, or fingers.  You may also have muscle weakness. It may be difficult to: ? Lift your leg or arm. ? Stand on your toes. ? Squeeze tightly with one of your hands.  Other symptoms may include: ? Numbness or tingling in the affected areas of the hands, arms, feet, or legs. ? Inability to control when you urinate or when you have bowel movements. This is a rare but serious sign of a severe herniated disk in the lower back.  How is this diagnosed? This condition may be diagnosed based on:  Your symptoms.  Your medical history.  A physical exam. The exam may include: ? Straight-leg test. You will lie on your back while your health care provider lifts your leg, keeping your knee straight. If you feel pain, you likely have a herniated disk. ? Neurological tests. This includes checking for numbness, reflexes, muscle strength, and posture.  Imaging tests, such as: ? X-rays. ? MRI. ? CT scan. ? Electromyogram (EMG) to check the nerves that control muscles. This test may be used to determine which nerves are affected by your herniated disk.  How is this treated? Treatment for this condition may include:  A short period of rest. This is usually the first treatment. ? You may be on bed rest for up to 2 days, or you may be instructed to stay home and avoid physical activity. ?  If you have a herniated disk in your lower back, avoid sitting as much as possible. Sitting increases pressure on the disk.  Medicines. These may include: ? NSAIDs to help reduce pain and swelling. ? Muscle relaxants to prevent sudden tightening of the back muscles (back spasms). ? Prescription pain medicines, if you have severe pain.  Steroid injections in the area of the herniated disk. This can help reduce pain and swelling.  Physical therapy to strengthen your back muscles.  In many cases, symptoms go away with treatment over a period of days or weeks. You will most likely be free  of symptoms after 3-4 months. If other treatments do not help to relieve your symptoms, you may need surgery. Follow these instructions at home: Medicines  Take over-the-counter and prescription medicines only as told by your health care provider.  Do not drive or use heavy machinery while taking prescription pain medicine. Activity  Rest as directed.  After your rest period: ? Return to your normal activities and gradually begin exercising as told by your health care provider. Ask your health care provider what activities and exercises are safe for you. ? Use good posture. ? Avoid movements that cause pain. ? Do not lift anything that is heavier than 10 lb (4.5 kg) until your health care provider says this is safe. ? Do not sit or stand for long periods of time without changing positions. ? Do not sit for long periods of time without getting up and moving around.  If physical therapy was prescribed, do exercises as instructed.  Aim to strengthen muscles in your back and abdomen with exercises like crunches, swimming, or walking. General instructions  Do not use any products that contain nicotine or tobacco, such as cigarettes and e-cigarettes. These products can delay healing. If you need help quitting, ask your health care provider.  Do not wear high-heeled shoes.  Do not sleep on your belly.  If you are overweight, work with your health care provider to lose weight safely.  To prevent or treat constipation while you are taking prescription pain medicine, your health care provider may recommend that you: ? Drink enough fluid to keep your urine clear or pale yellow. ? Take over-the-counter or prescription medicines. ? Eat foods that are high in fiber, such as fresh fruits and vegetables, whole grains, and beans. ? Limit foods that are high in fat and processed sugars, such as fried and sweet foods.  Keep all follow-up visits as told by your health care provider. This is  important. How is this prevented?  Maintain a healthy weight.  Try to avoid stressful situations.  Maintain physical fitness. Do at least 150 minutes of moderate-intensity exercise each week, such as brisk walking or water aerobics.  When lifting objects: ? Keep your feet at least shoulder-width apart and tighten your abdominal muscles. ? Keep your spine neutral as you bend your knees and hips. It is important to lift using the strength of your legs, not your back. Do not lock your knees straight out. ? Always ask for help to lift heavy or awkward objects. Contact a health care provider if:  You have back pain or neck pain that does not get better after 6 weeks.  You have severe pain in your back, neck, legs, or arms.  You develop numbness, tingling, or weakness in any part of your body. Get help right away if:  You cannot move your arms or legs.  You cannot control when you urinate  or have bowel movements.  You feel dizzy or you faint.  You have shortness of breath. This information is not intended to replace advice given to you by your health care provider. Make sure you discuss any questions you have with your health care provider. Document Released: 02/17/2000 Document Revised: 10/17/2015 Document Reviewed: 10/17/2015 Elsevier Interactive Patient Education  2017 [employer].

## 2018-01-28 NOTE — Progress Notes (Signed)
Pre visit review using our clinic review tool, if applicable. No additional management support is needed unless otherwise documented below in the visit note. 

## 2018-01-28 NOTE — Progress Notes (Signed)
Chief Complaint  Patient presents with  . Back Pain  . Depression  . Anxiety   F/u  1. Chronic back pain abnormal MRI with bulging disc pain is 7/10 today MRI abnormal 01/20/18. He is having anxiety/depression and difficulty working due to pain PHQ 9 acore 19 today and GAD 7 score 10 day    Review of Systems  Constitutional: Negative for weight loss.  HENT: Negative for hearing loss.   Eyes: Negative for blurred vision.  Respiratory: Negative for shortness of breath.   Cardiovascular: Negative for chest pain.  Musculoskeletal: Positive for back pain.  Skin: Negative for rash.  Psychiatric/Behavioral: Positive for depression. The patient is nervous/anxious.    Past Medical History:  Diagnosis Date  . Anxiety   . Chronic back pain   . Chronic headaches   . Depression   . Insomnia   . Scoliosis    Past Surgical History:  Procedure Laterality Date  . tubes in ears     age 42 or 42 y.o    Family History  Problem Relation Age of Onset  . Heart disease Father        cabg x 3   . Hypertension Father   . Arthritis Father   . Depression Father   . Hyperlipidemia Father   . Crohn's disease Father   . Hypertension Brother   . Alcohol abuse Brother   . Depression Brother   . Hyperlipidemia Brother   . Cancer Paternal Aunt        breast cancer  . Cancer Paternal Grandfather        Lung cancer  . Alcohol abuse Paternal Grandfather   . Arthritis Paternal Grandfather   . COPD Paternal Grandfather   . Early death Paternal Grandfather   . Heart disease Paternal Grandfather        cabg x 4   . Hyperlipidemia Paternal Grandfather   . Hypertension Paternal Grandfather   . Arthritis Mother        RA  . Arthritis Paternal Grandmother   . Depression Paternal Grandmother    Social History   Socioeconomic History  . Marital status: Divorced    Spouse name: Not on file  . Number of children: Not on file  . Years of education: Not on file  . Highest education level: Not on  file  Occupational History  . Not on file  Social Needs  . Financial resource strain: Not on file  . Food insecurity:    Worry: Not on file    Inability: Not on file  . Transportation needs:    Medical: Not on file    Non-medical: Not on file  Tobacco Use  . Smoking status: Current Every Day Smoker    Packs/day: 1.50    Years: 15.00    Pack years: 22.50    Types: Cigarettes  . Smokeless tobacco: Never Used  . Tobacco comment: x 12-13 years as of 10/08/17   Substance and Sexual Activity  . Alcohol use: Yes    Alcohol/week: 6.0 standard drinks    Types: 6 Cans of beer per week  . Drug use: Yes    Types: Marijuana    Comment: Occasional use approximately every 2-3 weeks  . Sexual activity: Yes  Lifestyle  . Physical activity:    Days per week: Not on file    Minutes per session: Not on file  . Stress: Not on file  Relationships  . Social connections:    Talks on phone:  Not on file    Gets together: Not on file    Attends religious service: Not on file    Active member of club or organization: Not on file    Attends meetings of clubs or organizations: Not on file    Relationship status: Not on file  . Intimate partner violence:    Fear of current or ex partner: Not on file    Emotionally abused: Not on file    Physically abused: Not on file    Forced sexual activity: Not on file  Other Topics Concern  . Not on file  Social History Narrative   No guns    Wears seat belt    Safe in relationship    Current Meds  Medication Sig  . cyclobenzaprine (FLEXERIL) 5 MG tablet Take 1-2 tablets 3 times daily as needed  . ibuprofen (ADVIL,MOTRIN) 600 MG tablet Take 1 tablet (600 mg total) by mouth every 6 (six) hours as needed.  . traMADol (ULTRAM) 50 MG tablet Take 1 tablet (50 mg total) by mouth every 6 (six) hours as needed.   No Known Allergies No results found for this or any previous visit (from the past 2160 hour(s)). Objective  Body mass index is 18.22 kg/m. Wt  Readings from Last 3 Encounters:  01/28/18 127 lb (57.6 kg)  01/20/18 135 lb (61.2 kg)  01/08/18 127 lb 4 oz (57.7 kg)   Temp Readings from Last 3 Encounters:  01/28/18 98 F (36.7 C) (Oral)  01/20/18 98.1 F (36.7 C) (Oral)  01/08/18 98.2 F (36.8 C) (Oral)   BP Readings from Last 3 Encounters:  01/28/18 (!) 136/92  01/20/18 (!) 109/59  01/08/18 128/78   Pulse Readings from Last 3 Encounters:  01/28/18 93  01/20/18 88  01/08/18 82    Physical Exam  Constitutional: He is oriented to person, place, and time. Vital signs are normal. He appears well-developed and well-nourished. He is cooperative.  HENT:  Head: Normocephalic and atraumatic.  Mouth/Throat: Oropharynx is clear and moist and mucous membranes are normal.  Eyes: Pupils are equal, round, and reactive to light. Conjunctivae are normal.  Cardiovascular: Normal rate, regular rhythm and normal heart sounds.  Pulmonary/Chest: Effort normal and breath sounds normal.  Neurological: He is alert and oriented to person, place, and time. Gait normal.  Skin: Skin is warm, dry and intact.  Psychiatric: He has a normal mood and affect. His speech is normal and behavior is normal. Judgment and thought content normal.  Nursing note and vitals reviewed.   Assessment   1. Chronic back pain with herniated disc and abnormal MRI 01/20/18  2. Anxiety and depression and insomnia GAD 7 score 10 and PHQ 9 score 19 today declines further medications as he deems issues related to #1  3. HM Plan  1. Referred to Dr. Sharlet Salina appt 02/03/18 with NP Consider steroid injection  toradol 60 mg x 1 today and depomedrol 40 x 1 today  Will not refill narcotics further  2. Klonopin 0.5 bid prn resigned controlled contract advised pt we can do UDS and if +THC will terminate contract  Called pharmacy to release bid dosing signed new control contract  3.  Declines flu shot Tdaphad 10/09/17 MMR immune, rec hep B vaccine  HSV 1 + rec smoking  cessationdisc with pt today see HPI chantix worked but side effects and side effects with wellbutrin disc patch and gum toc  rec D3 5000 IU qd  Provider: Dr. Olivia Mackie McLean-Scocuzza-Internal Medicine

## 2018-02-03 DIAGNOSIS — M5416 Radiculopathy, lumbar region: Secondary | ICD-10-CM | POA: Diagnosis not present

## 2018-02-03 DIAGNOSIS — M5136 Other intervertebral disc degeneration, lumbar region: Secondary | ICD-10-CM | POA: Diagnosis not present

## 2018-02-06 DIAGNOSIS — M5416 Radiculopathy, lumbar region: Secondary | ICD-10-CM | POA: Diagnosis not present

## 2018-02-06 DIAGNOSIS — M5136 Other intervertebral disc degeneration, lumbar region: Secondary | ICD-10-CM | POA: Diagnosis not present

## 2018-02-07 ENCOUNTER — Ambulatory Visit: Payer: BLUE CROSS/BLUE SHIELD | Admitting: Internal Medicine

## 2018-03-28 ENCOUNTER — Ambulatory Visit: Payer: BLUE CROSS/BLUE SHIELD | Admitting: Internal Medicine

## 2018-04-02 ENCOUNTER — Encounter: Payer: Self-pay | Admitting: Internal Medicine

## 2018-04-02 ENCOUNTER — Ambulatory Visit: Payer: BLUE CROSS/BLUE SHIELD | Admitting: Internal Medicine

## 2018-04-02 VITALS — BP 130/72 | HR 98 | Temp 98.2°F | Ht 70.0 in | Wt 128.2 lb

## 2018-04-02 DIAGNOSIS — F419 Anxiety disorder, unspecified: Secondary | ICD-10-CM

## 2018-04-02 DIAGNOSIS — G8929 Other chronic pain: Secondary | ICD-10-CM | POA: Diagnosis not present

## 2018-04-02 DIAGNOSIS — M545 Low back pain, unspecified: Secondary | ICD-10-CM

## 2018-04-02 DIAGNOSIS — F17219 Nicotine dependence, cigarettes, with unspecified nicotine-induced disorders: Secondary | ICD-10-CM

## 2018-04-02 DIAGNOSIS — G4700 Insomnia, unspecified: Secondary | ICD-10-CM

## 2018-04-02 MED ORDER — CYCLOBENZAPRINE HCL 5 MG PO TABS: 5.0000 mg | ORAL_TABLET | Freq: Two times a day (BID) | ORAL | 2 refills | Status: DC | PRN

## 2018-04-02 MED ORDER — VARENICLINE TARTRATE 0.5 MG PO TABS: ORAL_TABLET | ORAL | 0 refills | Status: DC

## 2018-04-02 MED ORDER — VARENICLINE TARTRATE 1 MG PO TABS: ORAL_TABLET | ORAL | 0 refills | Status: DC

## 2018-04-02 NOTE — Progress Notes (Signed)
Chief Complaint  Patient presents with  . Follow-up   F/u  1. Back pain doing better after injections with Dr. Sharlet Salina had b/l injections and another set sch 04/04/2018. 0/10 pain needs refill of Flexeril 2. Anxiety better on klonopin 0.5 bid prn as well as sleep  3. Smoking less now from >2 ppd to <1 ppd wants to try chantix as it helped dad and brother    Review of Systems  Constitutional: Negative for weight loss.  HENT: Negative for hearing loss.   Eyes: Negative for blurred vision.  Respiratory: Negative for shortness of breath.   Cardiovascular: Negative for chest pain.  Gastrointestinal: Negative for abdominal pain.  Musculoskeletal: Negative for back pain.  Skin: Negative for rash.  Neurological: Negative for headaches.  Psychiatric/Behavioral: The patient is not nervous/anxious.    Past Medical History:  Diagnosis Date  . Anxiety   . Chronic back pain   . Chronic headaches   . Depression   . Insomnia   . Scoliosis    Past Surgical History:  Procedure Laterality Date  . tubes in ears     age 45 or 43 y.o    Family History  Problem Relation Age of Onset  . Heart disease Father        cabg x 3   . Hypertension Father   . Arthritis Father   . Depression Father   . Hyperlipidemia Father   . Crohn's disease Father   . Hypertension Brother   . Alcohol abuse Brother   . Depression Brother   . Hyperlipidemia Brother   . Cancer Paternal Aunt        breast cancer  . Cancer Paternal Grandfather        Lung cancer  . Alcohol abuse Paternal Grandfather   . Arthritis Paternal Grandfather   . COPD Paternal Grandfather   . Early death Paternal Grandfather   . Heart disease Paternal Grandfather        cabg x 4   . Hyperlipidemia Paternal Grandfather   . Hypertension Paternal Grandfather   . Arthritis Mother        RA  . Arthritis Paternal Grandmother   . Depression Paternal Grandmother    Social History   Socioeconomic History  . Marital status: Divorced   Spouse name: Not on file  . Number of children: Not on file  . Years of education: Not on file  . Highest education level: Not on file  Occupational History  . Not on file  Social Needs  . Financial resource strain: Not on file  . Food insecurity:    Worry: Not on file    Inability: Not on file  . Transportation needs:    Medical: Not on file    Non-medical: Not on file  Tobacco Use  . Smoking status: Current Every Day Smoker    Packs/day: 1.50    Years: 15.00    Pack years: 22.50    Types: Cigarettes  . Smokeless tobacco: Never Used  . Tobacco comment: x 12-13 years as of 10/08/17   Substance and Sexual Activity  . Alcohol use: Yes    Alcohol/week: 6.0 standard drinks    Types: 6 Cans of beer per week  . Drug use: Yes    Types: Marijuana    Comment: Occasional use approximately every 2-3 weeks  . Sexual activity: Yes  Lifestyle  . Physical activity:    Days per week: Not on file    Minutes per session: Not  on file  . Stress: Not on file  Relationships  . Social connections:    Talks on phone: Not on file    Gets together: Not on file    Attends religious service: Not on file    Active member of club or organization: Not on file    Attends meetings of clubs or organizations: Not on file    Relationship status: Not on file  . Intimate partner violence:    Fear of current or ex partner: Not on file    Emotionally abused: Not on file    Physically abused: Not on file    Forced sexual activity: Not on file  Other Topics Concern  . Not on file  Social History Narrative   No guns    Wears seat belt    Safe in relationship    Current Meds  Medication Sig  . clonazePAM (KLONOPIN) 0.5 MG tablet Take 1 tablet (0.5 mg total) by mouth 2 (two) times daily as needed for anxiety. And sleep  . cyclobenzaprine (FLEXERIL) 5 MG tablet Take 1-2 tablets 3 times daily as needed  . ibuprofen (ADVIL,MOTRIN) 600 MG tablet Take 1 tablet (600 mg total) by mouth every 6 (six) hours as  needed.   Allergies  Allergen Reactions  . Gabapentin Nausea Only   No results found for this or any previous visit (from the past 2160 hour(s)). Objective  Body mass index is 18.39 kg/m. Wt Readings from Last 3 Encounters:  04/02/18 128 lb 3.2 oz (58.2 kg)  01/28/18 127 lb (57.6 kg)  01/20/18 135 lb (61.2 kg)   Temp Readings from Last 3 Encounters:  04/02/18 98.2 F (36.8 C) (Oral)  01/28/18 98 F (36.7 C) (Oral)  01/20/18 98.1 F (36.7 C) (Oral)   BP Readings from Last 3 Encounters:  04/02/18 130/72  01/28/18 (!) 136/92  01/20/18 (!) 109/59   Pulse Readings from Last 3 Encounters:  04/02/18 98  01/28/18 93  01/20/18 88    Physical Exam Vitals signs and nursing note reviewed.  Constitutional:      Appearance: Normal appearance. He is well-developed, well-groomed and normal weight.  HENT:     Head: Normocephalic and atraumatic.     Nose: Nose normal.     Mouth/Throat:     Mouth: Mucous membranes are moist.     Pharynx: Oropharynx is clear.  Cardiovascular:     Rate and Rhythm: Normal rate and regular rhythm.     Heart sounds: Normal heart sounds.  Pulmonary:     Effort: Pulmonary effort is normal.     Breath sounds: Normal breath sounds.  Skin:    General: Skin is warm and dry.  Neurological:     General: No focal deficit present.     Mental Status: He is alert and oriented to person, place, and time. Mental status is at baseline.     Gait: Gait normal.  Psychiatric:        Attention and Perception: Attention and perception normal.        Mood and Affect: Mood and affect normal.        Speech: Speech normal.        Behavior: Behavior normal. Behavior is cooperative.        Thought Content: Thought content normal.        Cognition and Memory: Cognition and memory normal.        Judgment: Judgment normal.     Assessment   1. Chronic back pain  2. Anxiety/insomnia improved  3. Nicotine dep. Though smoking less  4. HM Plan   1. inj with Dr. Sharlet Salina  04/04/2018 sch b/l 2nd time Refilled flexeril   2. Cont prn klonopin 0.5 bid prn Use sparingly  3. Counseled x 3 min smoking cessation congratulated cut back  rx chantix use patch or gum with medication  4.  Declines flu shot Tdaphad 10/09/17 MMR immune, rec hep B vaccine  HSV 1 +  rec smoking cessationdisc with pt today see HPI chantix worked but side effects and side effects with wellbutrin disc patch and gum toc >2ppd down to < 1ppd as of 04/02/2018   rec D3 5000 IU qd  Provider: Dr. Olivia Mackie McLean-Scocuzza-Internal Medicine

## 2018-04-02 NOTE — Progress Notes (Signed)
Pre visit review using our clinic review tool, if applicable. No additional management support is needed unless otherwise documented below in the visit note. 

## 2018-04-02 NOTE — Patient Instructions (Addendum)
Smoking Tobacco Information, Adult Smoking tobacco can be harmful to your health. Tobacco contains a poisonous (toxic), colorless chemical called nicotine. Nicotine is addictive. It changes the brain and can make it hard to stop smoking. Tobacco also has other toxic chemicals that can hurt your body and raise your risk of many cancers. How can smoking tobacco affect me? Smoking tobacco puts you at risk for:  Cancer. Smoking is most commonly associated with lung cancer, but can also lead to cancer in other parts of the body.  Chronic obstructive pulmonary disease (COPD). This is a long-term lung condition that makes it hard to breathe. It also gets worse over time.  High blood pressure (hypertension), heart disease, stroke, or heart attack.  Lung infections, such as pneumonia.  Cataracts. This is when the lenses in the eyes become clouded.  Digestive problems. This may include peptic ulcers, heartburn, and gastroesophageal reflux disease (GERD).  Oral health problems, such as gum disease and tooth loss.  Loss of taste and smell. Smoking can affect your appearance by causing:  Wrinkles.  Yellow or stained teeth, fingers, and fingernails. Smoking tobacco can also affect your social life, because:  It may be challenging to find places to smoke when away from home. Many workplaces, restaurants, hotels, and public places are tobacco-free.  Smoking is expensive. This is due to the cost of tobacco and the long-term costs of treating health problems from smoking.  Secondhand smoke may affect those around you. Secondhand smoke can cause lung cancer, breathing problems, and heart disease. Children of smokers have a higher risk for: ? Sudden infant death syndrome (SIDS). ? Ear infections. ? Lung infections. If you currently smoke tobacco, quitting now can help you:  Lead a longer and healthier life.  Look, smell, breathe, and feel better over time.  Save money.  Protect others from the  harms of secondhand smoke. What actions can I take to prevent health problems? Quit smoking   Do not start smoking. Quit if you already do.  Make a plan to quit smoking and commit to it. Look for programs to help you and ask your health care provider for recommendations and ideas.  Set a date and write down all the reasons you want to quit.  Let your friends and family know you are quitting so they can help and support you. Consider finding friends who also want to quit. It can be easier to quit with someone else, so that you can support each other.  Talk with your health care provider about using nicotine replacement medicines to help you quit, such as gum, lozenges, patches, sprays, or pills.  Do not replace cigarette smoking with electronic cigarettes, which are commonly called e-cigarettes. The safety of e-cigarettes is not known, and some may contain harmful chemicals.  If you try to quit but return to smoking, stay positive. It is common to slip up when you first quit, so take it one day at a time.  Be prepared for cravings. When you feel the urge to smoke, chew gum or suck on hard candy. Lifestyle  Stay busy and take care of your body.  Drink enough fluid to keep your urine pale yellow.  Get plenty of exercise and eat a healthy diet. This can help prevent weight gain after quitting.  Monitor your eating habits. Quitting smoking can cause you to have a larger appetite than when you smoke.  Find ways to relax. Go out with friends or family to a movie or a restaurant   where people do not smoke.  Ask your health care provider about having regular tests (screenings) to check for cancer. This may include blood tests, imaging tests, and other tests.  Find ways to manage your stress, such as meditation, yoga, or exercise. Where to find support To get support to quit smoking, consider:  Asking your health care provider for more information and resources.  Taking classes to learn  more about quitting smoking.  Looking for local organizations that offer resources about quitting smoking.  Joining a support group for people who want to quit smoking in your local community.  Calling the smokefree.gov counselor helpline: 1-800-Quit-Now (1-800-784-8669) Where to find more information You may find more information about quitting smoking from:  HelpGuide.org: www.helpguide.org  Smokefree.gov: smokefree.gov  American Lung Association: www.lung.org Contact a health care provider if you:  Have problems breathing.  Notice that your lips, nose, or fingers turn blue.  Have chest pain.  Are coughing up blood.  Feel faint or you pass out.  Have other health changes that cause you to worry. Summary  Smoking tobacco can negatively affect your health, the health of those around you, your finances, and your social life.  Do not start smoking. Quit if you already do. If you need help quitting, ask your health care provider.  Think about joining a support group for people who want to quit smoking in your local community. There are many effective programs that will help you to quit this behavior. This information is not intended to replace advice given to you by your health care provider. Make sure you discuss any questions you have with your health care provider. Document Released: 03/06/2016 Document Revised: 04/10/2017 Document Reviewed: 03/06/2016 Elsevier Interactive Patient Education  2019 [employer]. Coping with Quitting Smoking  Quitting smoking is a physical and mental challenge. You will face cravings, withdrawal symptoms, and temptation. Before quitting, work with your health care provider to make a plan that can help you cope. Preparation can help you quit and keep you from giving in. How can I cope with cravings? Cravings usually last for 5-10 minutes. If you get through it, the craving will pass. Consider taking the following actions to help you cope with  cravings:  Keep your mouth busy: ? Chew sugar-free gum. ? Suck on hard candies or a straw. ? Brush your teeth.  Keep your hands and body busy: ? Immediately change to a different activity when you feel a craving. ? Squeeze or play with a ball. ? Do an activity or a hobby, like making bead jewelry, practicing needlepoint, or working with wood. ? Mix up your normal routine. ? Take a short exercise break. Go for a quick walk or run up and down stairs. ? Spend time in public places where smoking is not allowed.  Focus on doing something kind or helpful for someone else.  Call a friend or family member to talk during a craving.  Join a support group.  Call a quit line, such as 1-800-QUIT-NOW.  Talk with your health care provider about medicines that might help you cope with cravings and make quitting easier for you. How can I deal with withdrawal symptoms? Your body may experience negative effects as it tries to get used to not having nicotine in the system. These effects are called withdrawal symptoms. They may include:  Feeling hungrier than normal.  Trouble concentrating.  Irritability.  Trouble sleeping.  Feeling depressed.  Restlessness and agitation.  Craving a cigarette. To manage withdrawal   withdrawal symptoms:  Avoid places, people, and activities that trigger your cravings.  Remember why you want to quit.  Get plenty of sleep.  Avoid coffee and other caffeinated drinks. These may worsen some of your symptoms. How can I handle social situations? Social situations can be difficult when you are quitting smoking, especially in the first few weeks. To manage this, you can:  Avoid parties, bars, and other social situations where people might be smoking.  Avoid alcohol.  Leave right away if you have the urge to smoke.  Explain to your family and friends that you are quitting smoking. Ask for understanding and support.  Plan activities with friends or family where  smoking is not an option. What are some ways I can cope with stress? Wanting to smoke may cause stress, and stress can make you want to smoke. Find ways to manage your stress. Relaxation techniques can help. For example:  Breathe slowly and deeply, in through your nose and out through your mouth.  Listen to soothing, relaxing music.  Talk with a family member or friend about your stress.  Light a candle.  Soak in a bath or take a shower.  Think about a peaceful place. What are some ways I can prevent weight gain? Be aware that many people gain weight after they quit smoking. However, not everyone does. To keep from gaining weight, have a plan in place before you quit and stick to the plan after you quit. Your plan should include:  Having healthy snacks. When you have a craving, it may help to: ? Eat plain popcorn, crunchy carrots, celery, or other cut vegetables. ? Chew sugar-free gum.  Changing how you eat: ? Eat small portion sizes at meals. ? Eat 4-6 small meals throughout the day instead of 1-2 large meals a day. ? Be mindful when you eat. Do not watch television or do other things that might distract you as you eat.  Exercising regularly: ? Make time to exercise each day. If you do not have time for a long workout, do short bouts of exercise for 5-10 minutes several times a day. ? Do some form of strengthening exercise, like weight lifting, and some form of aerobic exercise, like running or swimming.  Drinking plenty of water or other low-calorie or no-calorie drinks. Drink 6-8 glasses of water daily, or as much as instructed by your health care provider. Summary  Quitting smoking is a physical and mental challenge. You will face cravings, withdrawal symptoms, and temptation to smoke again. Preparation can help you as you go through these challenges.  You can cope with cravings by keeping your mouth busy (such as by chewing gum), keeping your body and hands busy, and making  calls to family, friends, or a helpline for people who want to quit smoking.  You can cope with withdrawal symptoms by avoiding places where people smoke, avoiding drinks with caffeine, and getting plenty of rest.  Ask your health care provider about the different ways to prevent weight gain, avoid stress, and handle social situations. This information is not intended to replace advice given to you by your health care provider. Make sure you discuss any questions you have with your health care provider. Document Released: 02/17/2016 Document Revised: 02/17/2016 Document Reviewed: 02/17/2016 Elsevier Interactive Patient Education  2019 [employer]  Varenicline oral tablets What is this medicine? VARENICLINE (var EN i kleen) is used to help people quit smoking. It is used with a patient support program recommended by  your physician. This medicine may be used for other purposes; ask your health care provider or pharmacist if you have questions. COMMON BRAND NAME(S): Chantix What should I tell my health care provider before I take this medicine? They need to know if you have any of these conditions: -heart disease -if you often drink alcohol -kidney disease -mental illness -on hemodialysis -seizures -history of stroke -suicidal thoughts, plans, or attempt; a previous suicide attempt by you or a family member -an unusual or allergic reaction to varenicline, other medicines, foods, dyes, or preservatives -pregnant or trying to get pregnant -breast-feeding How should I use this medicine? Take this medicine by mouth after eating. Take with a full glass of water. Follow the directions on the prescription label. Take your doses at regular intervals. Do not take your medicine more often than directed. There are 3 ways you can use this medicine to help you quit smoking; talk to your health care professional to decide which plan is right for you: 1) you can choose a quit date and start this  medicine 1 week before the quit date, or, 2) you can start taking this medicine before you choose a quit date, and then pick a quit date between day 8 and 35 days of treatment, or, 3) if you are not sure that you are able or willing to quit smoking right away, start taking this medicine and slowly decrease the amount you smoke as directed by your health care professional with the goal of being cigarette-free by week 12 of treatment. Stick to your plan; ask about support groups or other ways to help you remain cigarette-free. If you are motivated to quit smoking and did not succeed during a previous attempt with this medicine for reasons other than side effects, or if you returned to smoking after this treatment, speak with your health care professional about whether another course of this medicine may be right for you. A special MedGuide will be given to you by the pharmacist with each prescription and refill. Be sure to read this information carefully each time. Talk to your pediatrician regarding the use of this medicine in children. This medicine is not approved for use in children. Overdosage: If you think you have taken too much of this medicine contact a poison control center or emergency room at once. NOTE: This medicine is only for you. Do not share this medicine with others. What if I miss a dose? If you miss a dose, take it as soon as you can. If it is almost time for your next dose, take only that dose. Do not take double or extra doses. What may interact with this medicine? -alcohol -insulin -other medicines used to help people quit smoking -theophylline -warfarin This list may not describe all possible interactions. Give your health care provider a list of all the medicines, herbs, non-prescription drugs, or dietary supplements you use. Also tell them if you smoke, drink alcohol, or use illegal drugs. Some items may interact with your medicine. What should I watch for while using this  medicine? It is okay if you do not succeed at your attempt to quit and have a cigarette. You can still continue your quit attempt and keep using this medicine as directed. Just throw away your cigarettes and get back to your quit plan. Talk to your health care provider before using other treatments to quit smoking. Using this medicine with other treatments to quit smoking may increase the risk for side effects compared to using  a treatment alone. You may get drowsy or dizzy. Do not drive, use machinery, or do anything that needs mental alertness until you know how this medicine affects you. Do not stand or sit up quickly, especially if you are an older patient. This reduces the risk of dizzy or fainting spells. Decrease the number of alcoholic beverages that you drink during treatment with this medicine until you know if this medicine affects your ability to tolerate alcohol. Some people have experienced increased drunkenness (intoxication), unusual or sometimes aggressive behavior, or no memory of things that have happened (amnesia) during treatment with this medicine. Sleepwalking can happen during treatment with this medicine, and can sometimes lead to behavior that is harmful to you, other people, or property. Stop taking this medicine and tell your doctor if you start sleepwalking or have other unusual sleep-related activity. After taking this medicine, you may get up out of bed and do an activity that you do not know you are doing. The next morning, you may have no memory of this. Activities include driving a car ("sleep-driving"), making and eating food, talking on the phone, sexual activity, and sleep-walking. Serious injuries have occurred. Stop the medicine and call your doctor right away if you find out you have done any of these activities. Do not take this medicine if you have used alcohol that evening. Do not take it if you have taken another medicine for sleep. The risk of doing these  sleep-related activities is higher. Patients and their families should watch out for new or worsening depression or thoughts of suicide. Also watch out for sudden changes in feelings such as feeling anxious, agitated, panicky, irritable, hostile, aggressive, impulsive, severely restless, overly excited and hyperactive, or not being able to sleep. If this happens, call your health care professional. If you have diabetes and you quit smoking, the effects of insulin may be increased and you may need to reduce your insulin dose. Check with your doctor or health care professional about how you should adjust your insulin dose. What side effects may I notice from receiving this medicine? Side effects that you should report to your doctor or health care professional as soon as possible: -allergic reactions like skin rash, itching or hives, swelling of the face, lips, tongue, or throat -acting aggressive, being angry or violent, or acting on dangerous impulses -breathing problems -changes in emotions or moods -chest pain or chest tightness -feeling faint or lightheaded, falls -hallucination, loss of contact with reality -mouth sores -redness, blistering, peeling or loosening of the skin, including inside the mouth -signs and symptoms of a stroke like changes in vision; confusion; trouble speaking or understanding; severe headaches; sudden numbness or weakness of the face, arm or leg; trouble walking; dizziness; loss of balance or coordination -seizures -sleepwalking -suicidal thoughts or other mood changes Side effects that usually do not require medical attention (report to your doctor or health care professional if they continue or are bothersome): -constipation -gas -headache -nausea, vomiting -strange dreams -trouble sleeping This list may not describe all possible side effects. Call your doctor for medical advice about side effects. You may report side effects to FDA at 1-800-FDA-1088. Where  should I keep my medicine? Keep out of the reach of children. Store at room temperature between 15 and 30 degrees C (59 and 86 degrees F). Throw away any unused medicine after the expiration date. NOTE: This sheet is a summary. It may not cover all possible information. If you have questions about this medicine, talk  to your doctor, pharmacist, or health care provider.  2019 Elsevier/Gold Standard (2017-08-16 12:48:08)

## 2018-04-03 ENCOUNTER — Telehealth: Payer: Self-pay

## 2018-04-03 NOTE — Telephone Encounter (Signed)
Patient called to ask that we call pharmacy to get clarification in chantix prescription. Please advise?

## 2018-04-03 NOTE — Telephone Encounter (Signed)
Take 0.5 mg x day 1-3, 0.5 bid day 4-7 then 1 mg bid x 12 weeks  Call pharmacy   TMS

## 2018-04-04 DIAGNOSIS — M5416 Radiculopathy, lumbar region: Secondary | ICD-10-CM | POA: Diagnosis not present

## 2018-04-04 DIAGNOSIS — M5136 Other intervertebral disc degeneration, lumbar region: Secondary | ICD-10-CM | POA: Diagnosis not present

## 2018-04-28 ENCOUNTER — Other Ambulatory Visit: Payer: Self-pay | Admitting: Internal Medicine

## 2018-04-28 DIAGNOSIS — M545 Low back pain, unspecified: Secondary | ICD-10-CM

## 2018-04-28 DIAGNOSIS — G8929 Other chronic pain: Secondary | ICD-10-CM

## 2018-04-28 DIAGNOSIS — G4700 Insomnia, unspecified: Secondary | ICD-10-CM

## 2018-04-28 DIAGNOSIS — F419 Anxiety disorder, unspecified: Secondary | ICD-10-CM

## 2018-04-28 NOTE — Telephone Encounter (Signed)
Copied from CRM [id number]. Topic: Quick Communication - Rx Refill/Question >> Apr 28, 2018  3:02 PM Raoul Pitch, Sade R wrote: Medication:clonazePAM (KLONOPIN) 0.5 MG tablet , cyclobenzaprine (FLEXERIL) 5 MG tablet  Has the patient contacted their pharmacy? Yes  Preferred Pharmacy (with phone number or street name): [phone] [place], [place] - [address] [phone] (Phone) [phone] (Fax)    Agent: Please be advised that RX refills may take up to 3 business days. We ask that you follow-up with your pharmacy.

## 2018-04-30 NOTE — Telephone Encounter (Addendum)
Patient is checking on the status of this refill because he is completely out of medication.

## 2018-05-01 ENCOUNTER — Telehealth: Payer: Self-pay | Admitting: Internal Medicine

## 2018-05-01 ENCOUNTER — Other Ambulatory Visit: Payer: Self-pay | Admitting: Internal Medicine

## 2018-05-01 DIAGNOSIS — F17219 Nicotine dependence, cigarettes, with unspecified nicotine-induced disorders: Secondary | ICD-10-CM

## 2018-05-01 MED ORDER — CLONAZEPAM 0.5 MG PO TABS: 0.5000 mg | ORAL_TABLET | Freq: Two times a day (BID) | ORAL | 2 refills | Status: DC | PRN

## 2018-05-01 NOTE — Telephone Encounter (Signed)
Sent klonopin, flexeril should be at his pharmacy  As of today 05/01/18 this is 1st ive heard of refill request and did not see he requested in on 04/28/18   Why was there a delay?   TMS

## 2018-05-01 NOTE — Telephone Encounter (Signed)
Patient calling to check the status of this refill. States that he was advised to call back today if nothing had been sent to the pharmacy by end of day. Would like a call regarding this. Please advise.

## 2018-05-01 NOTE — Telephone Encounter (Signed)
Pt calling back follow up on his Rx  clonazePAM (KLONOPIN) 0.5 MG tablet.  Pt does NOT need the flexeril he has refills on that.  Pt states he is out and needs asap.  Not having messes up his sleep  [id number] [place], [place] - [address] [phone] (Phone) [phone] (Fax)

## 2018-05-02 DIAGNOSIS — M5416 Radiculopathy, lumbar region: Secondary | ICD-10-CM | POA: Diagnosis not present

## 2018-05-02 DIAGNOSIS — M5136 Other intervertebral disc degeneration, lumbar region: Secondary | ICD-10-CM | POA: Diagnosis not present

## 2018-05-02 NOTE — Telephone Encounter (Signed)
Left message for patient to return call back. [facility] may give and obtain information.  

## 2018-05-04 ENCOUNTER — Other Ambulatory Visit: Payer: Self-pay | Admitting: Internal Medicine

## 2018-05-04 DIAGNOSIS — F17219 Nicotine dependence, cigarettes, with unspecified nicotine-induced disorders: Secondary | ICD-10-CM

## 2018-05-04 MED ORDER — VARENICLINE TARTRATE 1 MG PO TABS: ORAL_TABLET | ORAL | 0 refills | Status: DC

## 2018-06-05 ENCOUNTER — Ambulatory Visit: Payer: Self-pay | Admitting: *Deleted

## 2018-06-05 NOTE — Telephone Encounter (Signed)
Call pt and inform   He should stay at home x 14 days if c/w COVID 19 suspected  If he is worsening he needs to go to the ED I.e worsening SOB, chest pain the recommendation top would be to go to the ED or Urgent care   Tylenol is fine  otc cough syrup I.e Mucinex DM or Robitussin DM  If someone not feeling ill can go to the store for him and get these that would be better    TMS

## 2018-06-05 NOTE — Telephone Encounter (Signed)
Patient is experiencing coughing, body aches, fatigue, fever, slightly labored breathing.Patient is afraid he has been exposed to COVID-19 and wants to know how long he has to wait to return to work. Patient states she is treating symptoms with Tylenol and cough drops- feels he does have changes in his chest with slightly more labored breathing than his normal. Patient wants to know how long he has to wait before he can return to work- fever free 3 days and 14 day self quarantine. Tried to explain it is both- patient wants to go back to work- has questions about getting paid and letters needed. Told patient he would need to speak to his employer about those things.  Offered to set up E visit at office for further advisement- but when contacted the office- his PCP is not seeing respiratory visit on loins. Patient advised UC and he got mad. He stated his doctor did not care about him and he would be finding care elsewhere. I apologized and told him I was sorry he felt that way- but he did not want to hear that and he hung up.  Additional Information . [1] Adult has symptoms of COVID-19 (fever, cough or SOB) AND [2] major community spread where patient lives AND [3] testing not being done for mild symptoms    Patient has been sent home from work due to symptoms and he wants to return to work.  Answer Assessment - Initial Assessment Questions 1. CLOSE CONTACT: "Who is the person with the confirmed or suspected COVID-19 infection that you were exposed to?"     Suspect- client who was sick 2. 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 DURATION of CONTACT: "How long were you in contact with the COVID-19 patient?" (e.g., a few seconds,  passed by person, a few minutes, live with the patient)     45 minutes-1 hour 5. DATE of CONTACT: "When did you have contact with a COVID-19 patient?" (e.g., how many days ago)     05/28/18 6. TRAVEL: "Have you traveled out of the country recently?" If so, "When and where?"     * Also ask about out-of-state travel, since the CDC has identified some high risk cities for community spread in the [place].     * Note: Travel becomes less relevant if there is widespread community transmission where the patient lives.     No travel 7. COMMUNITY SPREAD: "Are there lots of cases or COVID-19 (community spread) where you live?" (See public health department website, if unsure)   * MAJOR community spread: high number of cases; numbers of cases are increasing; many people hospitalized.   * MINOR community spread: low number of cases; not increasing; few or no people hospitalized     minor 8. SYMPTOMS: "Do you have any symptoms?" (e.g., fever, cough, breathing difficulty)     Cough, headache, fever, slight changes- more labored 9. PREGNANCY OR POSTPARTUM: "Is there any chance you are pregnant?" "When was your last menstrual period?" "Did you deliver in the last 2 weeks?"     n/a 10. HIGH RISK: "Do you have any heart or lung problems? Do you have a weak immune system?" (e.g., CHF, COPD, asthma,  HIV positive, chemotherapy, renal failure, diabetes mellitus, sickle cell anemia)       no  Protocols used: CORONAVIRUS (COVID-19) EXPOSURE-A-AH  Triage was stopped when patient starting to ask questions about returning to work and question protocol- vs.public information. Did advise CDC website as best place for most current symptom management. Call to office to see if appointment could be scheduled- when no appointment could be scheduled- did not transfer call because patient was already becoming difficult and did not want to have him be rude to them. [name] is aware.

## 2018-06-06 ENCOUNTER — Ambulatory Visit: Payer: Self-pay

## 2018-06-06 NOTE — Telephone Encounter (Signed)
Pt called extremely upset stating that he received a voice mail from the office stating that he was to stay home for 14 day. He was not to come to the office. He states he was not given a Web visit option. He says that he possibly had an exposure to COVID19. 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 He is unsure of her test results. He has been trying all week to get someone to test him so he can go back to work. He states that he has had cough, fever, headache, on Monday.  He says Thursday 4/2 his fever was 101. Today he has no fever. His cough is mild, he is not SOB. He states he needs to get back to work. He was read the non test criteria for coming off isolation.  He was warned that his fever had to be gone for 3 days no fever medication in those days. He was given the COVID general question help line number.  He is requesting a not from the office that will tell his employer that he has been out of work and when he can return. He says they want him tested. He will continue isolation until advised.  Reason for Disposition . 1] COVID-19 infection diagnosed or suspected AND [2] mild symptoms (fever, cough) AND [2] no trouble breathing or other complications  Answer Assessment - Initial Assessment Questions 1. COVID-19 DIAGNOSIS: "Who made your Coronavirus (COVID-19) diagnosis?" "Was it confirmed by a positive lab test?" If not diagnosed by a HCP, ask "Are there lots of cases (community spread) where you live?" (See public health department website, if unsure)   * MAJOR community spread: high number of cases; numbers of cases are increasing; many people hospitalized.   * MINOR community spread: low number of cases; not increasing; few or no people hospitalized    Possible exposure  Last Friday 2. ONSET: "When did the COVID-19 symptoms start?"      Fever tired coughing Sunday night 3. WORST SYMPTOM: "What is your worst symptom?"  (e.g., cough, fever, shortness of breath, muscle aches)     Body aches tired 4. COUGH: "How bad is the cough?"      almost gone 5. FEVER: "Do you have a fever?" If so, ask: "What is your temperature, how was it measured, and when did it start?"     No yesterday it 101 6. RESPIRATORY STATUS: "Describe your breathing?" (e.g., shortness of breath, wheezing, unable to speak)      no 7. BETTER-SAME-WORSE: "Are you getting better, staying the same or getting worse compared to yesterday?"  If getting worse, ask, "In what way?"     Better  8. HIGH RISK DISEASE: "Do you have any chronic medical problems?" (e.g., asthma, heart or lung disease, weak immune system, etc.)    no 9. PREGNANCY: "Is there any chance you are pregnant?" "When was your last menstrual period?"    N/A 10. OTHER SYMPTOMS: "Do you have any other symptoms?"  (e.g., runny nose, headache, sore throat, loss of smell)     Runny nose  Protocols used: CORONAVIRUS (COVID-19) DIAGNOSED OR SUSPECTED-A-AH

## 2018-06-06 NOTE — Telephone Encounter (Signed)
Lm on voicmail explaining that he should be quarantined for 14 days and if feel worse got o ED.  Call back if needed. Ok for [employer] to advise.   Nina,cma

## 2018-06-09 NOTE — Telephone Encounter (Signed)
If he wants or needs to be tested to return to work he can go to the ED at [facility] for testing  This is what I Advised and previously advised when he called with sx's last week  I can write a note if he goes to get tested that way we have data to verify if he does or does not have COVID 19  It takes 7 to 11 days to get the results back so he will need to be out of work for some time if he is worried he was exposed to COVID   How would he like to proceed ? -I cant write him a note unless he initiates himself going to the ED for testing which was rec. Last week  He can call these #s as well if he has further ?s about covid  [phone] [facility] or hotline [phone]   TMS

## 2018-06-09 NOTE — Telephone Encounter (Signed)
Patient is not having any more sx. The house the lady had the flu. He was sick 3-4 days now back to normal. Last fever was last Wed-Thursday. He would really not like to go to ED to risk possible exposure. Please advise.

## 2018-06-10 ENCOUNTER — Encounter: Payer: Self-pay | Admitting: Internal Medicine

## 2018-06-10 ENCOUNTER — Telehealth: Payer: Self-pay

## 2018-06-10 NOTE — Telephone Encounter (Signed)
Copied from CRM [id number]. Topic: Quick Communication - See Telephone Encounter >> Jun 10, 2018 10:05 AM Bronwen Betters, CMA wrote: CRM for notification. See Telephone encounter for: 06/10/18. >> Jun 10, 2018 10:27 AM Elliot Gault wrote: Relation to pt: self  Call back number: [phone] (Preferred) Pharmacy:  Reason for call:  Patient would like to discuss letter stating he was advised to quarantine for 14 days, please advise

## 2018-06-10 NOTE — Telephone Encounter (Signed)
What does he need I already wrote a letter ?   TMS

## 2018-06-10 NOTE — Telephone Encounter (Signed)
Call pt letter ready   TMS

## 2018-06-10 NOTE — Telephone Encounter (Signed)
Left message for patient to return call back. [facility] may give and obtain information.  

## 2018-06-10 NOTE — Telephone Encounter (Signed)
Left message for patient to return call back. PEC may give information.  

## 2018-07-31 ENCOUNTER — Telehealth: Payer: Self-pay | Admitting: Internal Medicine

## 2018-07-31 ENCOUNTER — Other Ambulatory Visit: Payer: Self-pay | Admitting: Internal Medicine

## 2018-07-31 DIAGNOSIS — F419 Anxiety disorder, unspecified: Secondary | ICD-10-CM

## 2018-07-31 DIAGNOSIS — M545 Low back pain, unspecified: Secondary | ICD-10-CM

## 2018-07-31 DIAGNOSIS — G8929 Other chronic pain: Secondary | ICD-10-CM

## 2018-07-31 DIAGNOSIS — G4700 Insomnia, unspecified: Secondary | ICD-10-CM

## 2018-07-31 DIAGNOSIS — F17219 Nicotine dependence, cigarettes, with unspecified nicotine-induced disorders: Secondary | ICD-10-CM

## 2018-07-31 MED ORDER — CLONAZEPAM 0.5 MG PO TABS: 0.5000 mg | ORAL_TABLET | Freq: Two times a day (BID) | ORAL | 2 refills | Status: DC | PRN

## 2018-07-31 MED ORDER — CYCLOBENZAPRINE HCL 5 MG PO TABS: 5.0000 mg | ORAL_TABLET | Freq: Two times a day (BID) | ORAL | 2 refills | Status: DC | PRN

## 2018-07-31 MED ORDER — VARENICLINE TARTRATE 1 MG PO TABS: ORAL_TABLET | ORAL | 0 refills | Status: DC

## 2018-07-31 NOTE — Telephone Encounter (Signed)
Copied from CRM [id number]. Topic: Quick Communication - Rx Refill/Question >> Jul 31, 2018  9:22 AM Maia Petties wrote: Medication: varenicline (CHANTIX) 1 MG tablet, cyclobenzaprine (FLEXERIL) 5 MG tablet, clonazePAM (KLONOPIN) 0.5 MG tablet - pt has enough for about 3 days of each and no refills remaining. He has appt for 6 mo f/u 10/01/2018. Please advise.  Has the patient contacted their pharmacy? Yes - told to call MD Preferred Pharmacy (with phone number or street name): [facility] [phone] [place], [place] - [address] [phone] (Phone)   [phone] (Fax)

## 2018-10-01 ENCOUNTER — Ambulatory Visit (INDEPENDENT_AMBULATORY_CARE_PROVIDER_SITE_OTHER): Payer: BC Managed Care – PPO | Admitting: Internal Medicine

## 2018-10-01 ENCOUNTER — Other Ambulatory Visit: Payer: Self-pay

## 2018-10-01 DIAGNOSIS — Z0000 Encounter for general adult medical examination without abnormal findings: Secondary | ICD-10-CM

## 2018-10-01 DIAGNOSIS — Z13220 Encounter for screening for lipoid disorders: Secondary | ICD-10-CM | POA: Diagnosis not present

## 2018-10-01 DIAGNOSIS — M545 Low back pain, unspecified: Secondary | ICD-10-CM

## 2018-10-01 DIAGNOSIS — Z1389 Encounter for screening for other disorder: Secondary | ICD-10-CM

## 2018-10-01 DIAGNOSIS — G8929 Other chronic pain: Secondary | ICD-10-CM

## 2018-10-01 DIAGNOSIS — G4700 Insomnia, unspecified: Secondary | ICD-10-CM

## 2018-10-01 DIAGNOSIS — Z1329 Encounter for screening for other suspected endocrine disorder: Secondary | ICD-10-CM | POA: Diagnosis not present

## 2018-10-01 DIAGNOSIS — F419 Anxiety disorder, unspecified: Secondary | ICD-10-CM

## 2018-10-01 DIAGNOSIS — E559 Vitamin D deficiency, unspecified: Secondary | ICD-10-CM

## 2018-10-01 MED ORDER — CYCLOBENZAPRINE HCL 10 MG PO TABS: 10.0000 mg | ORAL_TABLET | Freq: Two times a day (BID) | ORAL | 5 refills | Status: DC | PRN

## 2018-10-01 MED ORDER — CLONAZEPAM 0.5 MG PO TABS: 0.5000 mg | ORAL_TABLET | Freq: Two times a day (BID) | ORAL | 2 refills | Status: DC | PRN

## 2018-10-01 NOTE — Progress Notes (Signed)
Telephone Note  I connected with Parker Wilson   on 10/01/18 at 11:00 AM EDT by a telephone and verified that I am speaking with the correct person using two identifiers.  Location patient: home Location provider:work or home office Persons participating in the virtual visit: patient, provider  I discussed the limitations of evaluation and management by telemedicine and the availability of in person appointments. The patient expressed understanding and agreed to proceed.   HPI: 1. Chronic back pain s/p 2 sets of injections doing well except for occasional flare of back MSK pain which is relieved somewhat by flexeril 5 mg bid prn Pain is worse with twisting bending  2. Tobacco abuse back to smoking 1 ppd was off x 2 months with chantix but when he resumed smoking and used chantix cause him to have sleeping issues. He has been off chantix x 2-3 months but has 1 mg bid Rx refill to resume  3. Anxiety/insomnia stable on klonopin 0.5 mg bid prn which helps. He reports sleep overall improved though overall sleeping 4 hrs of consecutive sleep at night  Anxiety flared due to promotion getting at his job and has to take a state exam    ROS: See pertinent positives and negatives per HPI. General: wt stable  HEENT: no sore throat  CV: no chest pain  Lungs: no sob  AB : no ab pain  MSK: chronic back pain Skin: no issues  Neuro: no h/a  Psych+chronic insomnia and anxiety  GU: no issues   Past Medical History:  Diagnosis Date  . Anxiety   . Chronic back pain   . Chronic headaches   . Depression   . Insomnia   . Scoliosis   . Vitamin D deficiency     Past Surgical History:  Procedure Laterality Date  . tubes in ears     age 43 or 43 y.o     Family History  Problem Relation Age of Onset  . Heart disease Father        cabg x 3   . Hypertension Father   . Arthritis Father   . Depression Father   . Hyperlipidemia Father   . Crohn's disease Father   . Hypertension Brother   . Alcohol  abuse Brother   . Depression Brother   . Hyperlipidemia Brother   . Cancer Paternal Aunt        breast cancer  . Cancer Paternal Grandfather        Lung cancer  . Alcohol abuse Paternal Grandfather   . Arthritis Paternal Grandfather   . COPD Paternal Grandfather   . Early death Paternal Grandfather   . Heart disease Paternal Grandfather        cabg x 4   . Hyperlipidemia Paternal Grandfather   . Hypertension Paternal Grandfather   . Arthritis Mother        RA  . Arthritis Paternal Grandmother   . Depression Paternal Grandmother     SOCIAL HX:  No guns  Wears seat belt  Safe in relationship    Current Outpatient Medications:  .  Cholecalciferol (VITAMIN D-3) 125 MCG (5000 UT) TABS, Take 1 tablet by mouth., Disp: , Rfl:  .  clonazePAM (KLONOPIN) 0.5 MG tablet, Take 1 tablet (0.5 mg total) by mouth 2 (two) times daily as needed for anxiety. And sleep, Disp: 60 tablet, Rfl: 2 .  cyclobenzaprine (FLEXERIL) 10 MG tablet, Take 1 tablet (10 mg total) by mouth 2 (two) times daily as needed  for muscle spasms., Disp: 60 tablet, Rfl: 5 .  ibuprofen (ADVIL,MOTRIN) 600 MG tablet, Take 1 tablet (600 mg total) by mouth every 6 (six) hours as needed., Disp: 30 tablet, Rfl: 0 .  varenicline (CHANTIX) 0.5 MG tablet, 0.5 x day 1-3, 0.5 bid x day 4-7 then 1 mg bid, Disp: 11 tablet, Rfl: 0 .  varenicline (CHANTIX) 1 MG tablet, 1 mg bid, Disp: 180 tablet, Rfl: 0  EXAM:  VITALS per patient if applicable:  GENERAL: alert, oriented, appears well and in no acute distress  PSYCH/NEURO: pleasant and cooperative, no obvious depression or anxiety, speech and thought processing grossly intact  ASSESSMENT AND PLAN:  Discussed the following assessment and plan:  Annual physical exam - Fasting lasb 11/03/18  Declines flu shot Tdaphad 10/09/17 MMR immune, rec hep B vaccine  HSV 1 +  rec smoking cessationdisc with pt today see HPI chantix worked but side effects and side effects with wellbutrin disc  patch and gum tocsmoking 1 ppd as of today but down from >2 ppd  rec D3 5000 IU qd   Chronic low back pain, unspecified back pain laterality, unspecified whether sciatica present - Plan: cyclobenzaprine (FLEXERIL) 10 MG tablet bid prn increased from 5 mg bdi prn   Insomnia, unspecified type - Plan: clonazePAM (KLONOPIN) 0.5 MG tablet bid prn  Anxiety - Plan: clonazePAM (KLONOPIN) 0.5 MG tablet bid prn -substance contract signed 02/2018  rec can random drug test if and + will terminate contract for above  I discussed the assessment and treatment plan with the patient. The patient was provided an opportunity to ask questions and all were answered. The patient agreed with the plan and demonstrated an understanding of the instructions.   The patient was advised to call back or seek an in-person evaluation if the symptoms worsen or if the condition fails to improve as anticipated.  Time spent 15 minutes  Delorise Jackson, MD

## 2018-10-28 ENCOUNTER — Other Ambulatory Visit: Payer: Self-pay | Admitting: Internal Medicine

## 2018-10-28 DIAGNOSIS — G8929 Other chronic pain: Secondary | ICD-10-CM

## 2018-10-29 ENCOUNTER — Emergency Department
Admission: EM | Admit: 2018-10-29 | Discharge: 2018-10-29 | Disposition: A | Discharge status: Home / Self Care | Payer: BC Managed Care – PPO | Attending: Emergency Medicine | Admitting: Emergency Medicine

## 2018-10-29 ENCOUNTER — Encounter: Payer: Self-pay | Admitting: Emergency Medicine

## 2018-10-29 ENCOUNTER — Other Ambulatory Visit: Payer: Self-pay

## 2018-10-29 DIAGNOSIS — R1030 Lower abdominal pain, unspecified: Secondary | ICD-10-CM | POA: Diagnosis not present

## 2018-10-29 DIAGNOSIS — F17210 Nicotine dependence, cigarettes, uncomplicated: Secondary | ICD-10-CM | POA: Insufficient documentation

## 2018-10-29 DIAGNOSIS — R112 Nausea with vomiting, unspecified: Secondary | ICD-10-CM | POA: Diagnosis not present

## 2018-10-29 DIAGNOSIS — R1032 Left lower quadrant pain: Secondary | ICD-10-CM | POA: Insufficient documentation

## 2018-10-29 DIAGNOSIS — R197 Diarrhea, unspecified: Secondary | ICD-10-CM | POA: Diagnosis not present

## 2018-10-29 DIAGNOSIS — Z79899 Other long term (current) drug therapy: Secondary | ICD-10-CM | POA: Diagnosis not present

## 2018-10-29 LAB — URINALYSIS, COMPLETE (UACMP) WITH MICROSCOPIC
Bacteria, UA: NONE SEEN
Bilirubin Urine: NEGATIVE
Glucose, UA: NEGATIVE mg/dL
Hgb urine dipstick: NEGATIVE
Ketones, ur: NEGATIVE mg/dL
Leukocytes,Ua: NEGATIVE
Nitrite: NEGATIVE
Protein, ur: NEGATIVE mg/dL
Specific Gravity, Urine: 1.013 (ref 1.005–1.030)
pH: 7 (ref 5.0–8.0)

## 2018-10-29 LAB — COMPREHENSIVE METABOLIC PANEL
ALT: 15 U/L (ref 0–44)
AST: 22 U/L (ref 15–41)
Albumin: 4.7 g/dL (ref 3.5–5.0)
Alkaline Phosphatase: 51 U/L (ref 38–126)
Anion gap: 9 (ref 5–15)
BUN: 13 mg/dL (ref 6–20)
CO2: 26 mmol/L (ref 22–32)
Calcium: 9.2 mg/dL (ref 8.9–10.3)
Chloride: 103 mmol/L (ref 98–111)
Creatinine, Ser: 1.06 mg/dL (ref 0.61–1.24)
GFR calc Af Amer: >60 mL/min (ref >60)
GFR calc non Af Amer: >60 mL/min (ref >60)
Glucose, Bld: 105 mg/dL — ABNORMAL HIGH (ref 70–99)
Potassium: 5 mmol/L (ref 3.5–5.1)
Sodium: 138 mmol/L (ref 135–145)
Total Bilirubin: 0.6 mg/dL (ref 0.3–1.2)
Total Protein: 7.8 g/dL (ref 6.5–8.1)

## 2018-10-29 LAB — CBC
HCT: 41.3 % (ref 39.0–52.0)
Hemoglobin: 14 g/dL (ref 13.0–17.0)
MCH: 32.2 pg (ref 26.0–34.0)
MCHC: 33.9 g/dL (ref 30.0–36.0)
MCV: 94.9 fL (ref 80.0–100.0)
Platelets: 349 10*3/uL (ref 150–400)
RBC: 4.35 MIL/uL (ref 4.22–5.81)
RDW: 12.7 % (ref 11.5–15.5)
WBC: 8.1 10*3/uL (ref 4.0–10.5)
nRBC: 0 % (ref 0.0–0.2)

## 2018-10-29 LAB — LIPASE, BLOOD: Lipase: 31 U/L (ref 11–51)

## 2018-10-29 MED ORDER — FAMOTIDINE 20 MG PO TABS: 20.0000 mg | ORAL_TABLET | Freq: Two times a day (BID) | ORAL | 0 refills | Status: DC

## 2018-10-29 MED ORDER — FAMOTIDINE 20 MG PO TABS
20.0000 mg | ORAL_TABLET | Freq: Once | ORAL | Status: AC
  Administered 2018-10-29: 20 mg via ORAL
  Filled 2018-10-29: qty 1

## 2018-10-29 MED ORDER — DICYCLOMINE HCL 10 MG PO CAPS
20.0000 mg | ORAL_CAPSULE | Freq: Once | ORAL | Status: AC
  Administered 2018-10-29: 14:00:00 20 mg via ORAL
  Filled 2018-10-29: qty 2

## 2018-10-29 MED ORDER — DICYCLOMINE HCL 20 MG PO TABS: 20.0000 mg | ORAL_TABLET | Freq: Three times a day (TID) | ORAL | 0 refills | Status: DC | PRN

## 2018-10-29 NOTE — ED Triage Notes (Signed)
Patient presents to the ED with abdominal pain x 1 week.  Patient reports he has not had vomiting or diarrhea since Friday.  Patient states, "I think I've made it worse with drinking alcohol."  Patient states pain is worse after eating breakfast this am.  Patient reports pain is in the left lower abdomen.

## 2018-10-29 NOTE — ED Notes (Signed)
See triage note  Presents with left sided abd pain  States pain started last Friday  Was able to eat some on Sunday   States pain returned worse this am  Positive nausea this am  Last time vomited was 2 days ago   Describes pain as "burning"

## 2018-10-29 NOTE — Discharge Instructions (Addendum)
Your exam and labs are within normal limits at this time. Your symptoms may represent some mild gastritis or colitis. These are conditions of inflammation in the stomach or bowels, respectively. Take the prescription meds as directed and follow-up with your provider for continued symptoms. Return to the ED as needed.

## 2018-10-29 NOTE — ED Provider Notes (Signed)
[facility] Emergency Department Provider Note ____________________________________________  Time seen: 1352  I have reviewed the triage vital signs and the nursing notes.  HISTORY  Chief Complaint  Abdominal Pain  HPI Parker Wilson is a 43 y.o. male presents to the ED for evaluation for evaluation of a 1 week complaint of intermittent lower abdominal pain. He reports onset of pain about 30 minutes after eating, last week. He reports a single episode of nausea, vomiting, and diarrhea last week.  He denies any hematemesis, coffee-ground emesis, black tarry stools, or bright red blood per rectum.  He also denies any constipation or ongoing chronic abdominal pain.  Patient is not take any medications in the interim for his symptoms, but he does admit to taking ibuprofen regularly for chronic ongoing back pain.  He presents today also concerned that he may have made his symptoms worse by his regular daily alcohol intake.  He describes drinking 2-3 beers every evening after work.  He denies any sick contact, recent travel, bad food exposure, or other high risk concern.  Patient reported symptoms at this time or nearly resolved.  He does still localize some discomfort to the left upper and lower abdominal regions.  Past Medical History:  Diagnosis Date  . Anxiety   . Chronic back pain   . Chronic headaches   . Depression   . Insomnia   . Scoliosis   . Vitamin D deficiency     Patient Active Problem List   Diagnosis Date Noted  . Annual physical exam 10/01/2018  . Abnormal MRI, lumbar spine 01/28/2018  . Lumbar herniated disc 01/28/2018  . Cervicalgia 01/08/2018  . Depression 10/08/2017  . Insomnia 10/08/2017  . Chronic low back pain 10/08/2017  . Tobacco abuse 02/26/2012  . Headache, chronic daily 02/26/2012  . Fatigue 02/26/2012  . Anxiety 02/26/2012    Past Surgical History:  Procedure Laterality Date  . tubes in ears     age 89 or 43 y.o     Prior to  Admission medications   Medication Sig Start Date End Date Taking? Authorizing Provider  varenicline (CHANTIX) 0.5 MG tablet 0.5 x day 1-3, 0.5 bid x day 4-7 then 1 mg bid 04/02/18  Yes McLean-Scocuzza, Pasty Spillersracy N, MD  Cholecalciferol (VITAMIN D-3) 125 MCG (5000 UT) TABS Take 1 tablet by mouth.    [provider]  clonazePAM (KLONOPIN) 0.5 MG tablet Take 1 tablet (0.5 mg total) by mouth 2 (two) times daily as needed for anxiety. And sleep 10/01/18   McLean-Scocuzza, Pasty Spillersracy N, MD  cyclobenzaprine (FLEXERIL) 10 MG tablet Take 1 tablet (10 mg total) by mouth 2 (two) times daily as needed for muscle spasms. 10/01/18   McLean-Scocuzza, Pasty Spillersracy N, MD  dicyclomine (BENTYL) 20 MG tablet Take 1 tablet (20 mg total) by mouth 3 (three) times daily as needed for spasms. 10/29/18 10/29/19  Ford Peddie, Charlesetta IvoryJenise V Bacon, PA-C  famotidine (PEPCID) 20 MG tablet Take 1 tablet (20 mg total) by mouth 2 (two) times daily for 15 days. 10/29/18 11/13/18  Kaydin Labo, Charlesetta IvoryJenise V Bacon, PA-C    Allergies Gabapentin  Family History  Problem Relation Age of Onset  . Heart disease Father        cabg x 3   . Hypertension Father   . Arthritis Father   . Depression Father   . Hyperlipidemia Father   . Crohn's disease Father   . Hypertension Brother   . Alcohol abuse Brother   . Depression Brother   .  Hyperlipidemia Brother   . Cancer Paternal Aunt        breast cancer  . Cancer Paternal Grandfather        Lung cancer  . Alcohol abuse Paternal Grandfather   . Arthritis Paternal Grandfather   . COPD Paternal Grandfather   . Early death Paternal Grandfather   . Heart disease Paternal Grandfather        cabg x 4   . Hyperlipidemia Paternal Grandfather   . Hypertension Paternal Grandfather   . Arthritis Mother        RA  . Arthritis Paternal Grandmother   . Depression Paternal Grandmother     Social History Social History   Tobacco Use  . Smoking status: Current Every Day Smoker    Packs/day: 1.50    Years: 15.00     Pack years: 22.50    Types: Cigarettes  . Smokeless tobacco: Never Used  . Tobacco comment: x 12-13 years as of 10/08/17   Substance Use Topics  . Alcohol use: Yes    Alcohol/week: 6.0 standard drinks    Types: 6 Cans of beer per week  . Drug use: Yes    Types: Marijuana    Comment: Occasional use approximately every 2-3 weeks    Review of Systems  Constitutional: Negative for fever. Eyes: Negative for visual changes. ENT: Negative for sore throat. Cardiovascular: Negative for chest pain. Respiratory: Negative for shortness of breath. Gastrointestinal: Positive for abdominal pain, vomiting and diarrhea, now resolved Genitourinary: Negative for dysuria. Musculoskeletal: Negative for back pain. Skin: Negative for rash. Neurological: Negative for headaches, focal weakness or numbness. ____________________________________________  PHYSICAL EXAM:  VITAL SIGNS: ED Triage Vitals  Enc Vitals Group     BP 10/29/18 1122 120/70     Pulse Rate 10/29/18 1122 88     Resp 10/29/18 1122 20     Temp 10/29/18 1122 98.4 F (36.9 C)     Temp Source 10/29/18 1122 Oral     SpO2 10/29/18 1122 98 %     Weight 10/29/18 1123 135 lb (61.2 kg)     Height 10/29/18 1123 5\' 9"  (1.753 m)     Head Circumference --      Peak Flow --      Pain Score 10/29/18 1122 3     Pain Loc --      Pain Edu? --      Excl. in [place]? --     Constitutional: Alert and oriented. Well appearing and in no distress. Head: Normocephalic and atraumatic. Eyes: Conjunctivae are normal. Normal extraocular movements Cardiovascular: Normal rate, regular rhythm. Normal distal pulses. Respiratory: Normal respiratory effort. No wheezes/rales/rhonchi. Gastrointestinal: Soft, flat, and nontender. No distention, rebound, guarding, or rigidity appreciated.  No organomegaly noted.  Normal percussion to 4 quadrants.  No flank tenderness appreciated.  Normal active bowel sounds noted. Musculoskeletal: Nontender with normal range of motion  in all extremities.  Neurologic:  Normal gait without ataxia. Normal speech and language. No gross focal neurologic deficits are appreciated. Skin:  Skin is warm, dry and intact. No rash noted. ____________________________________________   LABS (pertinent positives/negatives) Labs Reviewed  COMPREHENSIVE METABOLIC PANEL - Abnormal; Notable for the following components:      Result Value   Glucose, Bld 105 (*)    All other components within normal limits  URINALYSIS, COMPLETE (UACMP) WITH MICROSCOPIC - Abnormal; Notable for the following components:   Color, Urine YELLOW (*)    APPearance HAZY (*)    All other components  within normal limits  LIPASE, BLOOD  CBC  ____________________________________________  PROCEDURES  Procedures Famotidine 20 mg PO Bentyl 20 mg PO ____________________________________________  INITIAL IMPRESSION / ASSESSMENT AND PLAN / ED COURSE  Parker Wilson was evaluated in Emergency Department on 10/29/2018 for the symptoms described in the history of present illness. He was evaluated in the context of the global COVID-19 pandemic, which necessitated consideration that the patient might be at risk for infection with the SARS-CoV-2 virus that causes COVID-19. Institutional protocols and algorithms that pertain to the evaluation of patients at risk for COVID-19 are in a state of rapid change based on information released by regulatory bodies including the CDC and federal and state organizations. These policies and algorithms were followed during the patient's care in the ED.  DDX: gastritis, pancreatitis, colitis, AGE  Patient with ED evaluation of 1 week complaint of intermittent abdominal pain primary localized to the left upper and lower regions.  Patient's exam is overall benign reassuring at this time.  Exam also did not show any acute infectious process.  Patient with a exam to does not indicate any acute peritoneal sign.  Patient will be discharged at this time  with prescription for famotidine and dicyclomine.  Symptoms likely represent a viral gastroenteritis at worst, and neither a mile intermittent gastritis and colitis.  He will be discharged with prescription and encouraged to follow with primary provider or return to the ED as needed. ____________________________________________  FINAL CLINICAL IMPRESSION(S) / ED DIAGNOSES  Final diagnoses:  Left lower quadrant abdominal pain      Karmen Stabs, Charlesetta Ivory, PA-C 10/29/18 1918    Minna Antis, MD 10/29/18 2034

## 2018-11-03 ENCOUNTER — Other Ambulatory Visit: Payer: BC Managed Care – PPO

## 2018-12-04 ENCOUNTER — Other Ambulatory Visit: Payer: Self-pay | Admitting: Internal Medicine

## 2018-12-04 DIAGNOSIS — F17219 Nicotine dependence, cigarettes, with unspecified nicotine-induced disorders: Secondary | ICD-10-CM

## 2018-12-05 ENCOUNTER — Other Ambulatory Visit: Payer: Self-pay | Admitting: Internal Medicine

## 2018-12-05 DIAGNOSIS — F17219 Nicotine dependence, cigarettes, with unspecified nicotine-induced disorders: Secondary | ICD-10-CM

## 2018-12-05 DIAGNOSIS — M545 Low back pain, unspecified: Secondary | ICD-10-CM

## 2018-12-05 DIAGNOSIS — G8929 Other chronic pain: Secondary | ICD-10-CM

## 2018-12-05 MED ORDER — VARENICLINE TARTRATE 1 MG PO TABS: ORAL_TABLET | ORAL | 0 refills | Status: DC

## 2018-12-05 MED ORDER — CYCLOBENZAPRINE HCL 10 MG PO TABS: 10.0000 mg | ORAL_TABLET | Freq: Two times a day (BID) | ORAL | 5 refills | Status: DC | PRN

## 2019-01-08 ENCOUNTER — Other Ambulatory Visit: Payer: Self-pay | Admitting: *Deleted

## 2019-01-08 DIAGNOSIS — Z20822 Contact with and (suspected) exposure to covid-19: Secondary | ICD-10-CM

## 2019-01-10 LAB — NOVEL CORONAVIRUS, NAA: SARS-CoV-2, NAA: NOT DETECTED

## 2019-01-30 ENCOUNTER — Other Ambulatory Visit: Payer: Self-pay | Admitting: Internal Medicine

## 2019-01-30 DIAGNOSIS — G4700 Insomnia, unspecified: Secondary | ICD-10-CM

## 2019-01-30 DIAGNOSIS — F419 Anxiety disorder, unspecified: Secondary | ICD-10-CM

## 2019-01-30 NOTE — Telephone Encounter (Signed)
Medication Refill - Medication: clonazePAM (KLONOPIN) 0.5 MG tablet  Has the patient contacted their pharmacy? Yes.   (Agent: If no, request that the patient contact the pharmacy for the refill.) (Agent: If yes, when and what did the pharmacy advise?)  Preferred Pharmacy (with phone number or street name): [id number] - [place], [place]: Please be advised that RX refills may take up to 3 business days. We ask that you follow-up with your pharmacy.

## 2019-01-30 NOTE — Telephone Encounter (Signed)
Requested medication (s) are due for refill today: yes  Requested medication (s) are on the active medication list: yes  Last refill:  10/01/2018  Future visit scheduled: yes  Notes to clinic:  Refill cannot be delegated    Requested Prescriptions  Pending Prescriptions Disp Refills   clonazePAM (KLONOPIN) 0.5 MG tablet 60 tablet 2    Sig: Take 1 tablet (0.5 mg total) by mouth 2 (two) times daily as needed for anxiety. And sleep     Not Delegated - Psychiatry:  Anxiolytics/Hypnotics Failed - 01/30/2019 10:40 AM      Failed - This refill cannot be delegated      Failed - Urine Drug Screen completed in last 360 days.      Passed - Valid encounter within last 6 months    Recent Outpatient Visits          4 months ago Annual physical exam   [place] McLean-Scocuzza, Nino Glow, MD   10 months ago Chronic low back pain, unspecified back pain laterality, unspecified whether sciatica present   [place] McLean-Scocuzza, Nino Glow, MD   1 year ago Lumbar herniated disc   [facility] [place] McLean-Scocuzza, Nino Glow, MD   1 year ago Chronic bilateral low back pain with bilateral sciatica   [facility] [place] McLean-Scocuzza, Nino Glow, MD   1 year ago Chronic midline low back pain with bilateral sciatica   [place] McLean-Scocuzza, Nino Glow, MD      Future Appointments            In 2 months McLean-Scocuzza, Nino Glow, MD [facility], [facility]

## 2019-02-02 ENCOUNTER — Other Ambulatory Visit: Payer: Self-pay | Admitting: Internal Medicine

## 2019-02-02 ENCOUNTER — Telehealth: Payer: Self-pay | Admitting: *Deleted

## 2019-02-02 DIAGNOSIS — F419 Anxiety disorder, unspecified: Secondary | ICD-10-CM

## 2019-02-02 DIAGNOSIS — G4700 Insomnia, unspecified: Secondary | ICD-10-CM

## 2019-02-02 MED ORDER — CLONAZEPAM 0.5 MG PO TABS: 0.5000 mg | ORAL_TABLET | Freq: Two times a day (BID) | ORAL | 2 refills | Status: DC | PRN

## 2019-02-02 NOTE — Telephone Encounter (Signed)
Copied from [place] [id number]. Topic: Quick Communication - Rx Refill/Question >> Feb 02, 2019 11:25 AM Carolyn Stare wrote: Medication clonazePAM (KLONOPIN) 0.5 MG tablet   Preferred Pharmacy [facility]   Agent: Please be advised that RX refills may take up to 3 business days. We ask that you follow-up with your pharmacy.

## 2019-03-24 DIAGNOSIS — U071 COVID-19: Secondary | ICD-10-CM | POA: Diagnosis not present

## 2019-04-07 ENCOUNTER — Other Ambulatory Visit: Payer: Self-pay

## 2019-04-07 ENCOUNTER — Telehealth: Payer: Self-pay | Admitting: Lab

## 2019-04-07 ENCOUNTER — Encounter: Payer: Self-pay | Admitting: Internal Medicine

## 2019-04-07 ENCOUNTER — Ambulatory Visit (INDEPENDENT_AMBULATORY_CARE_PROVIDER_SITE_OTHER): Payer: BC Managed Care – PPO | Admitting: Internal Medicine

## 2019-04-07 VITALS — Ht 70.0 in | Wt 140.0 lb

## 2019-04-07 DIAGNOSIS — Z1329 Encounter for screening for other suspected endocrine disorder: Secondary | ICD-10-CM

## 2019-04-07 DIAGNOSIS — F419 Anxiety disorder, unspecified: Secondary | ICD-10-CM | POA: Diagnosis not present

## 2019-04-07 DIAGNOSIS — G4700 Insomnia, unspecified: Secondary | ICD-10-CM | POA: Diagnosis not present

## 2019-04-07 DIAGNOSIS — E559 Vitamin D deficiency, unspecified: Secondary | ICD-10-CM

## 2019-04-07 DIAGNOSIS — Z1389 Encounter for screening for other disorder: Secondary | ICD-10-CM

## 2019-04-07 DIAGNOSIS — Z720 Tobacco use: Secondary | ICD-10-CM | POA: Diagnosis not present

## 2019-04-07 DIAGNOSIS — F17219 Nicotine dependence, cigarettes, with unspecified nicotine-induced disorders: Secondary | ICD-10-CM

## 2019-04-07 DIAGNOSIS — M5442 Lumbago with sciatica, left side: Secondary | ICD-10-CM

## 2019-04-07 DIAGNOSIS — R937 Abnormal findings on diagnostic imaging of other parts of musculoskeletal system: Secondary | ICD-10-CM

## 2019-04-07 DIAGNOSIS — Z13220 Encounter for screening for lipoid disorders: Secondary | ICD-10-CM

## 2019-04-07 DIAGNOSIS — Z0000 Encounter for general adult medical examination without abnormal findings: Secondary | ICD-10-CM

## 2019-04-07 DIAGNOSIS — G8929 Other chronic pain: Secondary | ICD-10-CM

## 2019-04-07 DIAGNOSIS — R739 Hyperglycemia, unspecified: Secondary | ICD-10-CM

## 2019-04-07 DIAGNOSIS — M5441 Lumbago with sciatica, right side: Secondary | ICD-10-CM

## 2019-04-07 MED ORDER — CLONAZEPAM 0.5 MG PO TABS: 0.5000 mg | ORAL_TABLET | Freq: Two times a day (BID) | ORAL | 2 refills | Status: DC | PRN

## 2019-04-07 MED ORDER — VARENICLINE TARTRATE 1 MG PO TABS: ORAL_TABLET | ORAL | 1 refills | Status: DC

## 2019-04-07 NOTE — Telephone Encounter (Signed)
Called Pt 2x to Remind him of Telephone appointment with Dr. Judie Grieve at 11:00am. Pt Never answer left a VM to call office.

## 2019-04-07 NOTE — Telephone Encounter (Signed)
Will need to reschedule appt if >10 minutes   TMS
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
## 2019-06-04 ENCOUNTER — Ambulatory Visit: Payer: BC Managed Care – PPO | Attending: Internal Medicine

## 2019-06-15 ENCOUNTER — Encounter: Payer: Self-pay | Admitting: Emergency Medicine

## 2019-06-15 ENCOUNTER — Emergency Department: Payer: BC Managed Care – PPO

## 2019-06-15 ENCOUNTER — Emergency Department
Admission: EM | Admit: 2019-06-15 | Discharge: 2019-06-15 | Disposition: A | Discharge status: Home / Self Care | Payer: BC Managed Care – PPO | Attending: Emergency Medicine | Admitting: Emergency Medicine

## 2019-06-15 ENCOUNTER — Other Ambulatory Visit: Payer: Self-pay

## 2019-06-15 DIAGNOSIS — S3992XA Unspecified injury of lower back, initial encounter: Secondary | ICD-10-CM | POA: Diagnosis not present

## 2019-06-15 DIAGNOSIS — S40021A Contusion of right upper arm, initial encounter: Secondary | ICD-10-CM | POA: Insufficient documentation

## 2019-06-15 DIAGNOSIS — S51822A Laceration with foreign body of left forearm, initial encounter: Secondary | ICD-10-CM | POA: Insufficient documentation

## 2019-06-15 DIAGNOSIS — S301XXA Contusion of abdominal wall, initial encounter: Secondary | ICD-10-CM | POA: Insufficient documentation

## 2019-06-15 DIAGNOSIS — S199XXA Unspecified injury of neck, initial encounter: Secondary | ICD-10-CM | POA: Diagnosis not present

## 2019-06-15 DIAGNOSIS — S299XXA Unspecified injury of thorax, initial encounter: Secondary | ICD-10-CM | POA: Diagnosis not present

## 2019-06-15 DIAGNOSIS — S3993XA Unspecified injury of pelvis, initial encounter: Secondary | ICD-10-CM | POA: Diagnosis not present

## 2019-06-15 DIAGNOSIS — Y92410 Unspecified street and highway as the place of occurrence of the external cause: Secondary | ICD-10-CM | POA: Diagnosis not present

## 2019-06-15 DIAGNOSIS — S59912A Unspecified injury of left forearm, initial encounter: Secondary | ICD-10-CM | POA: Diagnosis not present

## 2019-06-15 DIAGNOSIS — S5012XA Contusion of left forearm, initial encounter: Secondary | ICD-10-CM | POA: Diagnosis not present

## 2019-06-15 DIAGNOSIS — S0083XA Contusion of other part of head, initial encounter: Secondary | ICD-10-CM | POA: Insufficient documentation

## 2019-06-15 DIAGNOSIS — Y999 Unspecified external cause status: Secondary | ICD-10-CM | POA: Diagnosis not present

## 2019-06-15 DIAGNOSIS — M545 Low back pain: Secondary | ICD-10-CM | POA: Diagnosis not present

## 2019-06-15 DIAGNOSIS — S51812A Laceration without foreign body of left forearm, initial encounter: Secondary | ICD-10-CM

## 2019-06-15 DIAGNOSIS — S0990XA Unspecified injury of head, initial encounter: Secondary | ICD-10-CM | POA: Insufficient documentation

## 2019-06-15 DIAGNOSIS — S3991XA Unspecified injury of abdomen, initial encounter: Secondary | ICD-10-CM | POA: Diagnosis not present

## 2019-06-15 DIAGNOSIS — F17210 Nicotine dependence, cigarettes, uncomplicated: Secondary | ICD-10-CM | POA: Diagnosis not present

## 2019-06-15 DIAGNOSIS — Y9389 Activity, other specified: Secondary | ICD-10-CM | POA: Diagnosis not present

## 2019-06-15 DIAGNOSIS — R1032 Left lower quadrant pain: Secondary | ICD-10-CM | POA: Insufficient documentation

## 2019-06-15 DIAGNOSIS — T07XXXA Unspecified multiple injuries, initial encounter: Secondary | ICD-10-CM

## 2019-06-15 LAB — COMPREHENSIVE METABOLIC PANEL
ALT: 33 U/L (ref 0–44)
AST: 80 U/L — ABNORMAL HIGH (ref 15–41)
Albumin: 4.6 g/dL (ref 3.5–5.0)
Alkaline Phosphatase: 49 U/L (ref 38–126)
Anion gap: 10 (ref 5–15)
BUN: 15 mg/dL (ref 6–20)
CO2: 27 mmol/L (ref 22–32)
Calcium: 8.9 mg/dL (ref 8.9–10.3)
Chloride: 100 mmol/L (ref 98–111)
Creatinine, Ser: 0.91 mg/dL (ref 0.61–1.24)
GFR calc Af Amer: >60 mL/min (ref >60)
GFR calc non Af Amer: >60 mL/min (ref >60)
Glucose, Bld: 91 mg/dL (ref 70–99)
Potassium: 4.3 mmol/L (ref 3.5–5.1)
Sodium: 137 mmol/L (ref 135–145)
Total Bilirubin: 1.2 mg/dL (ref 0.3–1.2)
Total Protein: 7.4 g/dL (ref 6.5–8.1)

## 2019-06-15 LAB — CBC
HCT: 37.8 % — ABNORMAL LOW (ref 39.0–52.0)
Hemoglobin: 13 g/dL (ref 13.0–17.0)
MCH: 32.7 pg (ref 26.0–34.0)
MCHC: 34.4 g/dL (ref 30.0–36.0)
MCV: 95.2 fL (ref 80.0–100.0)
Platelets: 288 10*3/uL (ref 150–400)
RBC: 3.97 MIL/uL — ABNORMAL LOW (ref 4.22–5.81)
RDW: 13 % (ref 11.5–15.5)
WBC: 15.1 10*3/uL — ABNORMAL HIGH (ref 4.0–10.5)
nRBC: 0 % (ref 0.0–0.2)

## 2019-06-15 MED ORDER — LIDOCAINE HCL (PF) 1 % IJ SOLN
INTRAMUSCULAR | Status: AC
  Filled 2019-06-15: qty 5

## 2019-06-15 MED ORDER — ONDANSETRON HCL 4 MG/2ML IJ SOLN
4.0000 mg | Freq: Once | INTRAMUSCULAR | Status: AC
  Administered 2019-06-15: 4 mg via INTRAVENOUS
  Filled 2019-06-15: qty 2

## 2019-06-15 MED ORDER — CEPHALEXIN 500 MG PO CAPS: 500.0000 mg | ORAL_CAPSULE | Freq: Three times a day (TID) | ORAL | 0 refills | Status: DC

## 2019-06-15 MED ORDER — SODIUM CHLORIDE 0.9 % IV BOLUS
1000.0000 mL | Freq: Once | INTRAVENOUS | Status: AC
  Administered 2019-06-15: 1000 mL via INTRAVENOUS

## 2019-06-15 MED ORDER — HYDROCODONE-ACETAMINOPHEN 5-325 MG PO TABS: 1.0000 | ORAL_TABLET | Freq: Four times a day (QID) | ORAL | 0 refills | Status: DC | PRN

## 2019-06-15 MED ORDER — IOHEXOL 300 MG/ML  SOLN
100.0000 mL | Freq: Once | INTRAMUSCULAR | Status: AC | PRN
  Administered 2019-06-15: 100 mL via INTRAVENOUS

## 2019-06-15 MED ORDER — MORPHINE SULFATE (PF) 4 MG/ML IV SOLN: 4.0000 mg | Freq: Once | INTRAVENOUS | Status: DC

## 2019-06-15 MED ORDER — MORPHINE SULFATE (PF) 4 MG/ML IV SOLN
4.0000 mg | Freq: Once | INTRAVENOUS | Status: AC
  Administered 2019-06-15: 4 mg via INTRAVENOUS
  Filled 2019-06-15: qty 1

## 2019-06-15 MED ORDER — MORPHINE SULFATE (PF) 2 MG/ML IV SOLN
INTRAVENOUS | Status: AC
  Administered 2019-06-15: 2 mg
  Filled 2019-06-15: qty 2
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
## 2019-06-15 NOTE — Discharge Instructions (Signed)
As we discussed please take antibiotics for the entire course, please take your pain medication as needed, as written.  Return to the emergency department for any signs of infection such as increased pain, pus or development of fever.  You have had 2 sutures placed they will need to be removed in 7 to 10 days.  Please follow-up with your doctor for suture removal.  Return to the emergency department for any symptom personally concerning to yourself.

## 2019-07-24 ENCOUNTER — Other Ambulatory Visit: Payer: Self-pay | Admitting: Internal Medicine

## 2019-07-24 DIAGNOSIS — G4700 Insomnia, unspecified: Secondary | ICD-10-CM

## 2019-07-24 DIAGNOSIS — F419 Anxiety disorder, unspecified: Secondary | ICD-10-CM

## 2019-07-24 MED ORDER — CLONAZEPAM 0.5 MG PO TABS: 0.5000 mg | ORAL_TABLET | Freq: Two times a day (BID) | ORAL | 2 refills | Status: DC | PRN

## 2019-09-27 ENCOUNTER — Other Ambulatory Visit: Payer: Self-pay | Admitting: Internal Medicine

## 2019-09-27 DIAGNOSIS — G8929 Other chronic pain: Secondary | ICD-10-CM

## 2019-09-27 MED ORDER — CYCLOBENZAPRINE HCL 10 MG PO TABS: 10.0000 mg | ORAL_TABLET | Freq: Two times a day (BID) | ORAL | 5 refills | Status: DC | PRN

## 2019-10-07 ENCOUNTER — Telehealth: Payer: Self-pay | Admitting: Internal Medicine

## 2019-10-07 ENCOUNTER — Ambulatory Visit: Payer: BC Managed Care – PPO | Admitting: Internal Medicine

## 2019-10-07 NOTE — Telephone Encounter (Signed)
Patient no-showed today's appointment; appointment was for 10/07/19, provider notified for review of record.  Mychart sent to re-schedule. Pain contract created and in the system for patient to sign at next visit.

## 2019-10-23 ENCOUNTER — Other Ambulatory Visit: Payer: Self-pay | Admitting: Internal Medicine

## 2019-10-23 DIAGNOSIS — F419 Anxiety disorder, unspecified: Secondary | ICD-10-CM

## 2019-10-23 DIAGNOSIS — G4700 Insomnia, unspecified: Secondary | ICD-10-CM

## 2019-10-23 MED ORDER — CLONAZEPAM 0.5 MG PO TABS: 0.5000 mg | ORAL_TABLET | Freq: Two times a day (BID) | ORAL | 0 refills | Status: DC | PRN

## 2019-11-05 ENCOUNTER — Other Ambulatory Visit: Payer: BC Managed Care – PPO

## 2019-11-06 ENCOUNTER — Other Ambulatory Visit: Payer: Self-pay | Admitting: Internal Medicine

## 2019-11-06 DIAGNOSIS — G4700 Insomnia, unspecified: Secondary | ICD-10-CM

## 2019-11-06 DIAGNOSIS — F419 Anxiety disorder, unspecified: Secondary | ICD-10-CM

## 2019-11-15 ENCOUNTER — Other Ambulatory Visit: Payer: Self-pay | Admitting: Internal Medicine

## 2019-11-15 DIAGNOSIS — G4700 Insomnia, unspecified: Secondary | ICD-10-CM

## 2019-11-15 DIAGNOSIS — F419 Anxiety disorder, unspecified: Secondary | ICD-10-CM

## 2019-11-15 MED ORDER — CLONAZEPAM 0.5 MG PO TABS: 0.5000 mg | ORAL_TABLET | Freq: Two times a day (BID) | ORAL | 0 refills | Status: DC | PRN

## 2019-11-18 ENCOUNTER — Telehealth: Payer: Self-pay | Admitting: Internal Medicine

## 2019-11-18 ENCOUNTER — Other Ambulatory Visit: Payer: Self-pay | Admitting: Internal Medicine

## 2019-11-18 DIAGNOSIS — G4700 Insomnia, unspecified: Secondary | ICD-10-CM

## 2019-11-18 DIAGNOSIS — F419 Anxiety disorder, unspecified: Secondary | ICD-10-CM

## 2019-11-18 NOTE — Telephone Encounter (Signed)
Sent klonopin 11/18/19 not sue why patient is calling for this  Call pharmacy  Also pt needs to sch appt  

## 2019-11-22 ENCOUNTER — Other Ambulatory Visit: Payer: Self-pay | Admitting: Internal Medicine

## 2019-11-22 DIAGNOSIS — F419 Anxiety disorder, unspecified: Secondary | ICD-10-CM

## 2019-11-22 DIAGNOSIS — G4700 Insomnia, unspecified: Secondary | ICD-10-CM

## 2019-11-23 ENCOUNTER — Telehealth: Payer: Self-pay | Admitting: Internal Medicine

## 2019-11-23 NOTE — Telephone Encounter (Signed)
Sent klonopin 11/18/19 not sue why patient is calling for this  Call pharmacy  Also pt needs to sch appt

## 2019-11-24 NOTE — Telephone Encounter (Signed)
Patient not going to have active insurance until 01/04/20. Canceled lab appointment for 12/30/19. Scheduled Patient for follow up with fasting labs for 01/13/20 at 9:30 am. Patient verbalized understanding

## 2019-11-24 NOTE — Telephone Encounter (Signed)
Left message to return call 

## 2019-12-20 ENCOUNTER — Other Ambulatory Visit: Payer: Self-pay | Admitting: Internal Medicine

## 2019-12-20 DIAGNOSIS — G4700 Insomnia, unspecified: Secondary | ICD-10-CM

## 2019-12-20 DIAGNOSIS — F419 Anxiety disorder, unspecified: Secondary | ICD-10-CM

## 2019-12-21 ENCOUNTER — Other Ambulatory Visit: Payer: Self-pay | Admitting: Internal Medicine

## 2019-12-21 DIAGNOSIS — F419 Anxiety disorder, unspecified: Secondary | ICD-10-CM

## 2019-12-21 DIAGNOSIS — G4700 Insomnia, unspecified: Secondary | ICD-10-CM

## 2019-12-21 MED ORDER — CLONAZEPAM 0.5 MG PO TABS: 0.5000 mg | ORAL_TABLET | Freq: Two times a day (BID) | ORAL | 0 refills | Status: DC | PRN

## 2019-12-21 NOTE — Telephone Encounter (Signed)
Patient has an appointment scheduled for 01/13/20

## 2019-12-30 ENCOUNTER — Other Ambulatory Visit: Payer: Self-pay

## 2020-01-06 ENCOUNTER — Ambulatory Visit: Payer: Self-pay | Admitting: Internal Medicine

## 2020-01-11 ENCOUNTER — Encounter (INDEPENDENT_AMBULATORY_CARE_PROVIDER_SITE_OTHER): Payer: Self-pay

## 2020-01-13 ENCOUNTER — Ambulatory Visit: Payer: Self-pay | Admitting: Internal Medicine

## 2020-01-21 ENCOUNTER — Other Ambulatory Visit: Payer: Self-pay | Admitting: Internal Medicine

## 2020-01-21 DIAGNOSIS — G4700 Insomnia, unspecified: Secondary | ICD-10-CM

## 2020-01-21 DIAGNOSIS — F419 Anxiety disorder, unspecified: Secondary | ICD-10-CM

## 2020-01-22 MED ORDER — CLONAZEPAM 0.5 MG PO TABS: 0.5000 mg | ORAL_TABLET | Freq: Two times a day (BID) | ORAL | 0 refills | Status: DC | PRN

## 2020-02-09 ENCOUNTER — Other Ambulatory Visit: Payer: Self-pay

## 2020-02-09 DIAGNOSIS — F419 Anxiety disorder, unspecified: Secondary | ICD-10-CM

## 2020-02-09 DIAGNOSIS — G4700 Insomnia, unspecified: Secondary | ICD-10-CM

## 2020-02-09 NOTE — Telephone Encounter (Signed)
Patient scheduled for 03/15/2020. Okay to fill until then?  Pended for your approval or denial.

## 2020-02-12 ENCOUNTER — Other Ambulatory Visit: Payer: Self-pay | Admitting: Internal Medicine

## 2020-02-12 DIAGNOSIS — G4700 Insomnia, unspecified: Secondary | ICD-10-CM

## 2020-02-12 DIAGNOSIS — F419 Anxiety disorder, unspecified: Secondary | ICD-10-CM

## 2020-02-12 MED ORDER — CLONAZEPAM 0.5 MG PO TABS: 0.5000 mg | ORAL_TABLET | Freq: Two times a day (BID) | ORAL | 0 refills | Status: DC | PRN

## 2020-03-15 ENCOUNTER — Other Ambulatory Visit: Payer: Self-pay

## 2020-03-15 ENCOUNTER — Encounter: Payer: Self-pay | Admitting: Internal Medicine

## 2020-03-15 ENCOUNTER — Ambulatory Visit (INDEPENDENT_AMBULATORY_CARE_PROVIDER_SITE_OTHER): Payer: BC Managed Care – PPO | Admitting: Internal Medicine

## 2020-03-15 ENCOUNTER — Telehealth: Payer: Self-pay | Admitting: *Deleted

## 2020-03-15 VITALS — BP 122/70 | HR 106 | Temp 98.2°F | Ht 70.0 in | Wt 129.2 lb

## 2020-03-15 DIAGNOSIS — E559 Vitamin D deficiency, unspecified: Secondary | ICD-10-CM | POA: Diagnosis not present

## 2020-03-15 DIAGNOSIS — Z0000 Encounter for general adult medical examination without abnormal findings: Secondary | ICD-10-CM | POA: Diagnosis not present

## 2020-03-15 DIAGNOSIS — D649 Anemia, unspecified: Secondary | ICD-10-CM

## 2020-03-15 DIAGNOSIS — G4700 Insomnia, unspecified: Secondary | ICD-10-CM | POA: Diagnosis not present

## 2020-03-15 DIAGNOSIS — M545 Low back pain, unspecified: Secondary | ICD-10-CM

## 2020-03-15 DIAGNOSIS — Z1389 Encounter for screening for other disorder: Secondary | ICD-10-CM

## 2020-03-15 DIAGNOSIS — Z1329 Encounter for screening for other suspected endocrine disorder: Secondary | ICD-10-CM

## 2020-03-15 DIAGNOSIS — F419 Anxiety disorder, unspecified: Secondary | ICD-10-CM

## 2020-03-15 DIAGNOSIS — G8929 Other chronic pain: Secondary | ICD-10-CM

## 2020-03-15 LAB — CBC WITH DIFFERENTIAL/PLATELET
Basophils Absolute: 0.1 10*3/uL (ref 0.0–0.1)
Basophils Relative: 1.2 % (ref 0.0–3.0)
Eosinophils Absolute: 0.5 10*3/uL (ref 0.0–0.7)
Eosinophils Relative: 5 % (ref 0.0–5.0)
HCT: 38.9 % — ABNORMAL LOW (ref 39.0–52.0)
Hemoglobin: 13.3 g/dL (ref 13.0–17.0)
Lymphocytes Relative: 30.7 % (ref 12.0–46.0)
Lymphs Abs: 3.3 10*3/uL (ref 0.7–4.0)
MCHC: 34.1 g/dL (ref 30.0–36.0)
MCV: 95 fl (ref 78.0–100.0)
Monocytes Absolute: 0.7 10*3/uL (ref 0.1–1.0)
Monocytes Relative: 6.9 % (ref 3.0–12.0)
Neutro Abs: 6.1 10*3/uL (ref 1.4–7.7)
Neutrophils Relative %: 56.2 % (ref 43.0–77.0)
Platelets: 279 10*3/uL (ref 150.0–400.0)
RBC: 4.1 Mil/uL — ABNORMAL LOW (ref 4.22–5.81)
RDW: 14.2 % (ref 11.5–15.5)
WBC: 10.8 10*3/uL — ABNORMAL HIGH (ref 4.0–10.5)

## 2020-03-15 LAB — COMPREHENSIVE METABOLIC PANEL
ALT: 13 U/L (ref 0–53)
AST: 17 U/L (ref 0–37)
Albumin: 4.3 g/dL (ref 3.5–5.2)
Alkaline Phosphatase: 55 U/L (ref 39–117)
BUN: 14 mg/dL (ref 6–23)
CO2: 29 mEq/L (ref 19–32)
Calcium: 9.1 mg/dL (ref 8.4–10.5)
Chloride: 102 mEq/L (ref 96–112)
Creatinine, Ser: 0.87 mg/dL (ref 0.40–1.50)
GFR: 106.62 mL/min (ref >60.00)
Glucose, Bld: 86 mg/dL (ref 70–99)
Potassium: 4.3 mEq/L (ref 3.5–5.1)
Sodium: 138 mEq/L (ref 135–145)
Total Bilirubin: 0.3 mg/dL (ref 0.2–1.2)
Total Protein: 6.5 g/dL (ref 6.0–8.3)

## 2020-03-15 LAB — VITAMIN D 25 HYDROXY (VIT D DEFICIENCY, FRACTURES): VITD: >120 ng/mL

## 2020-03-15 LAB — LIPID PANEL
Cholesterol: 158 mg/dL (ref 0–200)
HDL: 52.8 mg/dL (ref >39.00)
LDL Cholesterol: 71 mg/dL (ref 0–99)
NonHDL: 104.9
Total CHOL/HDL Ratio: 3
Triglycerides: 170 mg/dL — ABNORMAL HIGH (ref 0.0–149.0)
VLDL: 34 mg/dL (ref 0.0–40.0)

## 2020-03-15 LAB — TSH: TSH: 1.03 u[IU]/mL (ref 0.35–4.50)

## 2020-03-15 MED ORDER — CLONAZEPAM 0.5 MG PO TABS: 0.5000 mg | ORAL_TABLET | Freq: Two times a day (BID) | ORAL | 5 refills | Status: DC | PRN

## 2020-03-15 MED ORDER — CYCLOBENZAPRINE HCL 10 MG PO TABS: 10.0000 mg | ORAL_TABLET | Freq: Two times a day (BID) | ORAL | 5 refills | Status: DC | PRN

## 2020-03-15 NOTE — Progress Notes (Signed)
Chief Complaint  Patient presents with  . Follow-up   Annual doing well 1. Needs meds refilled chronic back pain and anxiety klonopin 0.5 bid prn flexeril 10 mg bid prn    Review of Systems  Constitutional: Negative for weight loss.  HENT: Negative for hearing loss.   Eyes: Negative for blurred vision.  Respiratory: Negative for shortness of breath.   Cardiovascular: Negative for chest pain.  Gastrointestinal: Negative for heartburn.  Musculoskeletal: Negative for falls.  Skin: Negative for rash.  Neurological: Negative for headaches.  Psychiatric/Behavioral: Negative for depression.   Past Medical History:  Diagnosis Date  . Anxiety   . Chronic back pain   . Chronic headaches   . Depression   . Insomnia   . Scoliosis   . Vitamin D deficiency    Past Surgical History:  Procedure Laterality Date  . tubes in ears     age 10 or 45 y.o    Family History  Problem Relation Age of Onset  . Heart disease Father        cabg x 3   . Hypertension Father   . Arthritis Father   . Depression Father   . Hyperlipidemia Father   . Crohn's disease Father   . Hypertension Brother   . Alcohol abuse Brother   . Depression Brother   . Hyperlipidemia Brother   . Cancer Paternal Aunt        breast cancer  . Cancer Paternal Grandfather        Lung cancer  . Alcohol abuse Paternal Grandfather   . Arthritis Paternal Grandfather   . COPD Paternal Grandfather   . Early death Paternal Grandfather   . Heart disease Paternal Grandfather        cabg x 4   . Hyperlipidemia Paternal Grandfather   . Hypertension Paternal Grandfather   . Other Paternal Grandfather        died covid 09-Jun-2019  . Arthritis Mother        RA  . Arthritis Paternal Grandmother   . Depression Paternal Grandmother   . Seizures Paternal Grandmother    Social History   Socioeconomic History  . Marital status: Divorced    Spouse name: Not on file  . Number of children: Not on file  . Years of education: Not on  file  . Highest education level: Not on file  Occupational History  . Not on file  Tobacco Use  . Smoking status: Current Every Day Smoker    Packs/day: 1.50    Years: 15.00    Pack years: 22.50    Types: Cigarettes  . Smokeless tobacco: Never Used  . Tobacco comment: x 12-13 years as of 10/08/17   Substance and Sexual Activity  . Alcohol use: Yes    Alcohol/week: 6.0 standard drinks    Types: 6 Cans of beer per week  . Drug use: Yes    Types: Marijuana    Comment: Occasional use approximately every 2-3 weeks  . Sexual activity: Yes  Other Topics Concern  . Not on file  Social History Narrative   No guns    Wears seat belt    Safe in relationship    Social Determinants of Health   Financial Resource Strain: Not on file  Food Insecurity: Not on file  Transportation Needs: Not on file  Physical Activity: Not on file  Stress: Not on file  Social Connections: Not on file  Intimate Partner Violence: Not on file   Current  Meds  Medication Sig  . ibuprofen (ADVIL) 200 MG tablet Take 200 mg by mouth every 6 (six) hours as needed.  . [DISCONTINUED] clonazePAM (KLONOPIN) 0.5 MG tablet Take 1 tablet (0.5 mg total) by mouth 2 (two) times daily as needed for anxiety. And sleep APPT further refills. Call to schedule  . [DISCONTINUED] cyclobenzaprine (FLEXERIL) 10 MG tablet Take 1 tablet (10 mg total) by mouth 2 (two) times daily as needed for muscle spasms.   Allergies  Allergen Reactions  . Gabapentin Nausea Only   No results found for this or any previous visit (from the past 2160 hour(s)). Objective  Body mass index is 18.54 kg/m. Wt Readings from Last 3 Encounters:  03/15/20 129 lb 3.2 oz (58.6 kg)  06/15/19 135 lb (61.2 kg)  04/07/19 140 lb (63.5 kg)   Temp Readings from Last 3 Encounters:  03/15/20 98.2 F (36.8 C) (Oral)  06/15/19 98.6 F (37 C) (Oral)  10/29/18 98.3 F (36.8 C) (Oral)   BP Readings from Last 3 Encounters:  03/15/20 122/70  06/15/19 126/80   10/29/18 128/78   Pulse Readings from Last 3 Encounters:  03/15/20 (!) 106  06/15/19 85  10/29/18 74    Physical Exam Vitals and nursing note reviewed.  Constitutional:      Appearance: Normal appearance. He is well-developed and well-groomed.  HENT:     Head: Normocephalic and atraumatic.  Eyes:     Conjunctiva/sclera: Conjunctivae normal.     Pupils: Pupils are equal, round, and reactive to light.  Cardiovascular:     Rate and Rhythm: Normal rate and regular rhythm.     Heart sounds: Normal heart sounds. No murmur heard.   Pulmonary:     Effort: Pulmonary effort is normal.     Breath sounds: Normal breath sounds.  Skin:    General: Skin is warm and dry.  Neurological:     General: No focal deficit present.     Mental Status: He is alert and oriented to person, place, and time. Mental status is at baseline.     Gait: Gait normal.  Psychiatric:        Attention and Perception: Attention and perception normal.        Mood and Affect: Mood and affect normal.        Speech: Speech normal.        Behavior: Behavior normal. Behavior is cooperative.        Thought Content: Thought content normal.        Cognition and Memory: Cognition and memory normal.        Judgment: Judgment normal.     Assessment  Plan  Annual physical exam - Plan: Comprehensive metabolic panel, Lipid panel, CBC with Differential/Platelet, Iron, TIBC and Ferritin Panel, Vitamin D (25 hydroxy), TSH, Urinalysis, Routine w reflex microscopic,  Fasting labs today Declines flu shot Tdaphad 10/09/17 MMR immune, rec hep B vaccine  HSV 1 + moderna 2/2  rec smoking cessation1 ppd  rec D3 5000 IU qdis taking with MVT  Colonoscopy disc age 45 declines for now  PSA consider baseline 47  rec healthy diet and exercise   Anemia, unspecified type - Plan: CBC with Differential/Platelet, Iron, TIBC and Ferritin Panel  Chronic low back pain, s/p 2 epidural steroid injections - Plan: cyclobenzaprine  (FLEXERIL) 10 MG tablet bid prn   Insomnia, unspecified type - Plan: clonazePAM (KLONOPIN) 0.5 MG tablet bid prn 6 month supply   Anxiety controlled - Plan: clonazePAM (KLONOPIN) 0.5 MG  tablet bid prn  Vitamin D deficiency - Plan: Vitamin D (25 hydroxy)   Provider: Dr. Olivia Mackie McLean-Scocuzza-[facility]

## 2020-03-15 NOTE — Addendum Note (Signed)
Addended by: Warden Fillers on: 03/15/2020 09:18 AM   Modules accepted: Orders

## 2020-03-15 NOTE — Patient Instructions (Addendum)
moderna due 04/25/20 or 05/23/20      Steps to Quit Smoking Smoking tobacco is the leading cause of preventable death. It can affect almost every organ in the body. Smoking puts you and those around you at risk for developing many serious chronic diseases. Quitting smoking can be difficult, but it is one of the best things that you can do for your health. It is never too late to quit. How do I get ready to quit? When you decide to quit smoking, create a plan to help you succeed. Before you quit:  Pick a date to quit. Set a date within the next 2 weeks to give you time to prepare.  Write down the reasons why you are quitting. Keep this list in places where you will see it often.  Tell your family, friends, and co-workers that you are quitting. Support from your loved ones can make quitting easier.  Talk with your health care provider about your options for quitting smoking.  Find out what treatment options are covered by your health insurance.  Identify people, places, things, and activities that make you want to smoke (triggers). Avoid them. What first steps can I take to quit smoking?  Throw away all cigarettes at home, at work, and in your car.  Throw away smoking accessories, such as Set designer.  Clean your car. Make sure to empty the ashtray.  Clean your home, including curtains and carpets. What strategies can I use to quit smoking? Talk with your health care provider about combining strategies, such as taking medicines while you are also receiving in-person counseling. Using these two strategies together makes you more likely to succeed in quitting than if you used either strategy on its own.  If you are pregnant or breastfeeding, talk with your health care provider about finding counseling or other support strategies to quit smoking. Do not take medicine to help you quit smoking unless your health care provider tells you to do so. To quit smoking: Quit right  away  Quit smoking completely, instead of gradually reducing how much you smoke over a period of time. Research shows that stopping smoking right away is more successful than gradually quitting.  Attend in-person counseling to help you build problem-solving skills. You are more likely to succeed in quitting if you attend counseling sessions regularly. Even short sessions of 10 minutes can be effective. Take medicine You may take medicines to help you quit smoking. Some medicines require a prescription and some you can purchase over-the-counter. Medicines may have nicotine in them to replace the nicotine in cigarettes. Medicines may:  Help to stop cravings.  Help to relieve withdrawal symptoms. Your health care provider may recommend:  Nicotine patches, gum, or lozenges.  Nicotine inhalers or sprays.  Non-nicotine medicine that is taken by mouth. Find resources Find resources and support systems that can help you to quit smoking and remain smoke-free after you quit. These resources are most helpful when you use them often. They include:  Online chats with a Veterinary surgeon.  Telephone quitlines.  Printed Materials engineer.  Support groups or group counseling.  Text messaging programs.  Mobile phone apps or applications. Use apps that can help you stick to your quit plan by providing reminders, tips, and encouragement. There are many free apps for mobile devices as well as websites. Examples include Quit Guide from the [employer] and smokefree.gov   What things can I do to make it easier to quit?  Reach out to your family  and friends for support and encouragement. Call telephone quitlines (1-800-QUIT-NOW), reach out to support groups, or work with a counselor for support.  Ask people who smoke to avoid smoking around you.  Avoid places that trigger you to smoke, such as bars, parties, or smoke-break areas at work.  Spend time with people who do not smoke.  Lessen the stress in your life.  Stress can be a smoking trigger for some people. To lessen stress, try: ? Exercising regularly. ? Doing deep-breathing exercises. ? Doing yoga. ? Meditating. ? Performing a body scan. This involves closing your eyes, scanning your body from head to toe, and noticing which parts of your body are particularly tense. Try to relax the muscles in those areas.   How will I feel when I quit smoking? Day 1 to 3 weeks Within the first 24 hours of quitting smoking, you may start to feel withdrawal symptoms. These symptoms are usually most noticeable 2-3 days after quitting, but they usually do not last for more than 2-3 weeks. You may experience these symptoms:  Mood swings.  Restlessness, anxiety, or irritability.  Trouble concentrating.  Dizziness.  Strong cravings for sugary foods and nicotine.  Mild weight gain.  Constipation.  Nausea.  Coughing or a sore throat.  Changes in how the medicines that you take for unrelated issues work in your body.  Depression.  Trouble sleeping (insomnia). Week 3 and afterward After the first 2-3 weeks of quitting, you may start to notice more positive results, such as:  Improved sense of smell and taste.  Decreased coughing and sore throat.  Slower heart rate.  Lower blood pressure.  Clearer skin.  The ability to breathe more easily.  Fewer sick days. Quitting smoking can be very challenging. Do not get discouraged if you are not successful the first time. Some people need to make many attempts to quit before they achieve long-term success. Do your best to stick to your quit plan, and talk with your health care provider if you have any questions or concerns. Summary  Smoking tobacco is the leading cause of preventable death. Quitting smoking is one of the best things that you can do for your health.  When you decide to quit smoking, create a plan to help you succeed.  Quit smoking right away, not slowly over a period of time.  When  you start quitting, seek help from your health care provider, family, or friends. This information is not intended to replace advice given to you by your health care provider. Make sure you discuss any questions you have with your health care provider. Document Revised: 11/14/2018 Document Reviewed: 05/10/2018 Elsevier Patient Education  2021 [employer].

## 2020-03-15 NOTE — Telephone Encounter (Signed)
CRITICAL VALUE STICKER  CRITICAL VALUE: Vitamin D: 120 (H)  RECEIVER (on-site recipient of call): Silvestre Moment, CMA/XT  DATE & TIME NOTIFIED: 03/15/2020 @ 11:15am  MESSENGER (representative from lab): Hope  MD NOTIFIED: Dr. Lonie Peak  TIME OF NOTIFICATION: 11:15am  RESPONSE: see note or results

## 2020-03-15 NOTE — Telephone Encounter (Signed)
Vitamin D high stop otc vitamin D x 1 month then restart 1000 IU daily otc D3

## 2020-03-16 LAB — IRON,TIBC AND FERRITIN PANEL
%SAT: 36 % (calc) (ref 20–48)
Ferritin: 32 ng/mL — ABNORMAL LOW (ref 38–380)
Iron: 124 ug/dL (ref 50–180)
TIBC: 340 mcg/dL (calc) (ref 250–425)

## 2020-03-16 NOTE — Telephone Encounter (Signed)
Called and spoke to [place]. Taber states that he has already seen the message through [employer]. He verbalized understanding and had no further questions.

## 2020-03-17 ENCOUNTER — Other Ambulatory Visit: Payer: Self-pay

## 2020-03-17 ENCOUNTER — Other Ambulatory Visit: Payer: BC Managed Care – PPO

## 2020-03-17 DIAGNOSIS — Z0000 Encounter for general adult medical examination without abnormal findings: Secondary | ICD-10-CM | POA: Diagnosis not present

## 2020-03-17 DIAGNOSIS — Z1389 Encounter for screening for other disorder: Secondary | ICD-10-CM | POA: Diagnosis not present

## 2020-03-17 NOTE — Addendum Note (Signed)
Addended by: Bonnell Public I on: 03/17/2020 09:47 AM   Modules accepted: Orders

## 2020-03-18 LAB — URINALYSIS, ROUTINE W REFLEX MICROSCOPIC
Bilirubin, UA: NEGATIVE
Glucose, UA: NEGATIVE
Ketones, UA: NEGATIVE
Leukocytes,UA: NEGATIVE
Nitrite, UA: NEGATIVE
Protein,UA: NEGATIVE
RBC, UA: NEGATIVE
Specific Gravity, UA: 1.021 (ref 1.005–1.030)
Urobilinogen, Ur: 0.2 mg/dL (ref 0.2–1.0)
pH, UA: 5 (ref 5.0–7.5)

## 2020-03-30 ENCOUNTER — Inpatient Hospital Stay
Admission: AD | Admit: 2020-03-30 | Discharge: 2020-04-02 | DRG: 885 | Disposition: A | Discharge status: Home / Self Care | Payer: BC Managed Care – PPO | Source: Intra-hospital | Attending: Behavioral Health | Admitting: Behavioral Health

## 2020-03-30 ENCOUNTER — Other Ambulatory Visit: Payer: Self-pay

## 2020-03-30 ENCOUNTER — Emergency Department (EMERGENCY_DEPARTMENT_HOSPITAL)
Admission: EM | Admit: 2020-03-30 | Discharge: 2020-03-30 | Disposition: A | Discharge status: Other Acute Inpatient Hospital | Payer: BC Managed Care – PPO | Source: Home / Self Care | Attending: Emergency Medicine | Admitting: Emergency Medicine

## 2020-03-30 DIAGNOSIS — F419 Anxiety disorder, unspecified: Secondary | ICD-10-CM | POA: Diagnosis present

## 2020-03-30 DIAGNOSIS — F1123 Opioid dependence with withdrawal: Secondary | ICD-10-CM | POA: Diagnosis present

## 2020-03-30 DIAGNOSIS — F1210 Cannabis abuse, uncomplicated: Secondary | ICD-10-CM | POA: Diagnosis present

## 2020-03-30 DIAGNOSIS — F411 Generalized anxiety disorder: Secondary | ICD-10-CM | POA: Diagnosis not present

## 2020-03-30 DIAGNOSIS — G4700 Insomnia, unspecified: Secondary | ICD-10-CM | POA: Diagnosis not present

## 2020-03-30 DIAGNOSIS — F17210 Nicotine dependence, cigarettes, uncomplicated: Secondary | ICD-10-CM | POA: Diagnosis not present

## 2020-03-30 DIAGNOSIS — F4324 Adjustment disorder with disturbance of conduct: Secondary | ICD-10-CM | POA: Diagnosis not present

## 2020-03-30 DIAGNOSIS — Z818 Family history of other mental and behavioral disorders: Secondary | ICD-10-CM | POA: Diagnosis not present

## 2020-03-30 DIAGNOSIS — F332 Major depressive disorder, recurrent severe without psychotic features: Secondary | ICD-10-CM | POA: Diagnosis not present

## 2020-03-30 DIAGNOSIS — R45851 Suicidal ideations: Secondary | ICD-10-CM | POA: Diagnosis present

## 2020-03-30 DIAGNOSIS — Z20822 Contact with and (suspected) exposure to covid-19: Secondary | ICD-10-CM | POA: Insufficient documentation

## 2020-03-30 DIAGNOSIS — Z811 Family history of alcohol abuse and dependence: Secondary | ICD-10-CM | POA: Diagnosis not present

## 2020-03-30 DIAGNOSIS — Z720 Tobacco use: Secondary | ICD-10-CM | POA: Diagnosis present

## 2020-03-30 DIAGNOSIS — F5104 Psychophysiologic insomnia: Secondary | ICD-10-CM | POA: Diagnosis present

## 2020-03-30 DIAGNOSIS — F339 Major depressive disorder, recurrent, unspecified: Secondary | ICD-10-CM | POA: Diagnosis present

## 2020-03-30 DIAGNOSIS — F32A Depression, unspecified: Secondary | ICD-10-CM | POA: Insufficient documentation

## 2020-03-30 DIAGNOSIS — Z046 Encounter for general psychiatric examination, requested by authority: Secondary | ICD-10-CM | POA: Insufficient documentation

## 2020-03-30 LAB — URINALYSIS, COMPLETE (UACMP) WITH MICROSCOPIC
Bilirubin Urine: NEGATIVE
Glucose, UA: NEGATIVE mg/dL
Hgb urine dipstick: NEGATIVE
Ketones, ur: 5 mg/dL — AB
Leukocytes,Ua: NEGATIVE
Nitrite: NEGATIVE
Protein, ur: NEGATIVE mg/dL
Specific Gravity, Urine: 1.017 (ref 1.005–1.030)
Squamous Epithelial / HPF: NONE SEEN (ref 0–5)
pH: 6 (ref 5.0–8.0)

## 2020-03-30 LAB — COMPREHENSIVE METABOLIC PANEL
ALT: 32 U/L (ref 0–44)
AST: 49 U/L — ABNORMAL HIGH (ref 15–41)
Albumin: 4.2 g/dL (ref 3.5–5.0)
Alkaline Phosphatase: 43 U/L (ref 38–126)
Anion gap: 13 (ref 5–15)
BUN: 13 mg/dL (ref 6–20)
CO2: 24 mmol/L (ref 22–32)
Calcium: 9.1 mg/dL (ref 8.9–10.3)
Chloride: 102 mmol/L (ref 98–111)
Creatinine, Ser: 0.77 mg/dL (ref 0.61–1.24)
GFR, Estimated: >60 mL/min (ref >60)
Glucose, Bld: 120 mg/dL — ABNORMAL HIGH (ref 70–99)
Potassium: 4.2 mmol/L (ref 3.5–5.1)
Sodium: 139 mmol/L (ref 135–145)
Total Bilirubin: 0.4 mg/dL (ref 0.3–1.2)
Total Protein: 7.5 g/dL (ref 6.5–8.1)

## 2020-03-30 LAB — RESP PANEL BY RT-PCR (FLU A&B, COVID) ARPGX2
Influenza A by PCR: NEGATIVE
Influenza B by PCR: NEGATIVE
SARS Coronavirus 2 by RT PCR: NEGATIVE

## 2020-03-30 LAB — URINE DRUG SCREEN, QUALITATIVE (ARMC ONLY)
Amphetamines, Ur Screen: NOT DETECTED
Barbiturates, Ur Screen: NOT DETECTED
Benzodiazepine, Ur Scrn: POSITIVE — AB
Cannabinoid 50 Ng, Ur ~~LOC~~: POSITIVE — AB
Cocaine Metabolite,Ur ~~LOC~~: POSITIVE — AB
MDMA (Ecstasy)Ur Screen: NOT DETECTED
Methadone Scn, Ur: NOT DETECTED
Opiate, Ur Screen: NOT DETECTED
Phencyclidine (PCP) Ur S: NOT DETECTED
Tricyclic, Ur Screen: POSITIVE — AB

## 2020-03-30 LAB — CBC
HCT: 36.3 % — ABNORMAL LOW (ref 39.0–52.0)
Hemoglobin: 12.2 g/dL — ABNORMAL LOW (ref 13.0–17.0)
MCH: 31.8 pg (ref 26.0–34.0)
MCHC: 33.6 g/dL (ref 30.0–36.0)
MCV: 94.5 fL (ref 80.0–100.0)
Platelets: 298 10*3/uL (ref 150–400)
RBC: 3.84 MIL/uL — ABNORMAL LOW (ref 4.22–5.81)
RDW: 13.4 % (ref 11.5–15.5)
WBC: 9.3 10*3/uL (ref 4.0–10.5)
nRBC: 0 % (ref 0.0–0.2)

## 2020-03-30 LAB — SALICYLATE LEVEL: Salicylate Lvl: <7 mg/dL — ABNORMAL LOW (ref 7.0–30.0)

## 2020-03-30 LAB — ACETAMINOPHEN LEVEL: Acetaminophen (Tylenol), Serum: <10 ug/mL — ABNORMAL LOW (ref 10–30)

## 2020-03-30 LAB — ETHANOL: Alcohol, Ethyl (B): 63 mg/dL — ABNORMAL HIGH (ref <10)

## 2020-03-30 MED ORDER — LORAZEPAM 2 MG PO TABS
0.0000 mg | ORAL_TABLET | Freq: Four times a day (QID) | ORAL | Status: AC
  Administered 2020-03-31 – 2020-04-01 (×5): 2 mg via ORAL
  Filled 2020-03-30 (×6): qty 1

## 2020-03-30 MED ORDER — MELATONIN 3 MG PO TABS
3.0000 mg | ORAL_TABLET | Freq: Every day | ORAL | Status: DC
Start: 2020-03-30 — End: 2020-03-30
  Filled 2020-03-30: qty 1

## 2020-03-30 MED ORDER — LORAZEPAM 2 MG PO TABS
0.0000 mg | ORAL_TABLET | Freq: Two times a day (BID) | ORAL | Status: DC
  Administered 2020-04-01: 2 mg via ORAL

## 2020-03-30 MED ORDER — MELATONIN 5 MG PO TABS
2.5000 mg | ORAL_TABLET | Freq: Every day | ORAL | Status: DC
  Filled 2020-03-30: qty 0.5

## 2020-03-30 MED ORDER — LORAZEPAM 2 MG/ML IJ SOLN: 0.0000 mg | Freq: Four times a day (QID) | INTRAMUSCULAR | Status: AC

## 2020-03-30 MED ORDER — THIAMINE HCL 100 MG PO TABS
100.0000 mg | ORAL_TABLET | Freq: Every day | ORAL | Status: DC
  Administered 2020-03-30: 100 mg via ORAL
  Filled 2020-03-30: qty 1

## 2020-03-30 MED ORDER — LORAZEPAM 2 MG/ML IJ SOLN: 0.0000 mg | Freq: Four times a day (QID) | INTRAMUSCULAR | Status: DC

## 2020-03-30 MED ORDER — LORAZEPAM 2 MG PO TABS: 0.0000 mg | ORAL_TABLET | Freq: Four times a day (QID) | ORAL | Status: DC

## 2020-03-30 MED ORDER — THIAMINE HCL 100 MG/ML IJ SOLN: 100.0000 mg | Freq: Every day | INTRAMUSCULAR | Status: DC

## 2020-03-30 MED ORDER — LORAZEPAM 2 MG/ML IJ SOLN: 0.0000 mg | Freq: Two times a day (BID) | INTRAMUSCULAR | Status: DC

## 2020-03-30 MED ORDER — LORAZEPAM 2 MG PO TABS: 0.0000 mg | ORAL_TABLET | Freq: Two times a day (BID) | ORAL | Status: DC

## 2020-03-30 NOTE — ED Notes (Signed)
Pt removed: Thermal top and bottoms Pants tshirt Sweatshirt Socks  Boots [place] All locked in [employer]

## 2020-03-30 NOTE — ED Notes (Signed)
Report to Ricola, RN now

## 2020-03-30 NOTE — ED Notes (Signed)
Patient transferred from Triage to room [facility] after dressing out and screening for contraband. Report received from Raquel, RN including situation, background, assessment and recommendations. Pt oriented to [employer] including Q15 minute rounds as well as Psychologist, counselling for their protection. Patient is alert and oriented, warm and dry in no acute distress. Patient denies HI, and AVH. Pt. Encouraged to let this nurse know if needs arise.

## 2020-03-30 NOTE — ED Notes (Signed)
Pt reports fiance OD on heroin x 3 weeks ago. States he has increased alcohol consumption since, drinking approx 1/2 gal of whiskey a day and 3-6 beers. Pt reports not feeling reason to live and SI without plan. Pt reports he has been taking "[place]" a night. Pt tearful, quiet in hallway now. Cooperative and polite. Denies SI, HI, AVH

## 2020-03-30 NOTE — BH Assessment (Signed)
Comprehensive Clinical Assessment (CCA) Note  03/30/2020 DOCK BACCAM [id number]  Chief Complaint:  Chief Complaint  Patient presents with  . Suicidal   Visit Diagnosis: Depression  Bereavement    Parker Wilson is a 45 year old patient who presented to [facility] ED voluntarily requesting help for SI; pt. has been placed under IVC for psych MD Dr. Larinda Buttery for safety. According to the triage note, Pt states fianc killed herself 3 weeks ago and has been suicidal since. Has been taking Roxicodone daily in large amounts since to "numb the pain". Upon interview, pt. had a depressed mood and a flat affect. Pt was noted to be emotionally distressed throughout the interview. Pt states, "What is rehab and getting clean going to do." Pt admitted to using 30 opiates on the morning of 03/30/20. Pt reports symptoms of depression including tearfulness, hopelessness, and guilt. Pt expressed that his fianc began using heroin prior to her death and that he'd given her an ultimatum to either stop using or she'd have to leave. Pt explained that his fianc threatened to kill herself if he made her leave and ended up overdosing after he'd made her leave. Pt endorses SI with a plan. Refused to articulate specific plan stating, "I'd rather not say". Pt denies current HI, AV/H, NSSIB, or access to guns/weapons. Pt reports receiving substance abuse treatment in [place] in his past and that he'd been doing well prior to his wife's passing. Pt reports that he smokes cannabis and drinks beer/wine daily after work; however, his substance use has increased significantly. Pt has a hx of anxiety, depression, and chronic back pain but reports that he is not connected to a psychiatrist, therapist, and is not on any medications to treat his disorders. Pt reports seeing his PCP for pain management. Pt reports upcoming court dates on 05/09/20 for an assault and attempt to kill charge. Pt is in need of supports as he lacks a support system. Pt is oriented  x5. Pt reports that he lives alone. Pt was communicative but highly preoccupied with thoughts about his fianc and his grief emotions.   Recommendations for Services/Supports/Treatments: Disposition: Per psych NP Madaline Brilliant., pt. is recommended for inpatient treatment   CCA Screening, Triage and Referral (STR)  Patient Reported Information How did you hear about [place]? Self  Referral name: Self  Referral phone number: No data recorded  Whom do you see for routine medical problems? Primary Care  Practice/Facility Name: [facility]  Practice/Facility Phone Number: [phone]  Name of Contact: No data recorded Contact Number: No data recorded Contact Fax Number: No data recorded Prescriber Name: Dr. Judie Grieve  Prescriber Address (if known): No data recorded  What Is the Reason for Your Visit/Call Today? Suicidal ideation  How Long Has This Been Causing You Problems? 1 wk - 1 month  What Do You Feel Would Help You the Most Today? Therapy; Medication   Have You Recently Been in Any Inpatient Treatment (Hospital/Detox/Crisis Center/28-Day Program)? No  Name/Location of Program/Hospital:No data recorded How Long Were You There? No data recorded When Were You Discharged? No data recorded  Have You Ever Received Services From [facility] Before? No  Who Do You See at [facility]? No data recorded  Have You Recently Had Any Thoughts About Hurting Yourself? Yes  Are You Planning to Commit Suicide/Harm Yourself At This time? Yes   Have you Recently Had Thoughts About Hurting Someone Karolee Ohs? No  Explanation: No data recorded  Have You Used Any Alcohol or Drugs in the  Past 24 Hours? Yes  How Long Ago Did You Use Drugs or Alcohol? 1500  What Did You Use and How Much? Alcohol; and 30 roxicets   Do You Currently Have a Therapist/Psychiatrist? No  Name of Therapist/Psychiatrist: No data recorded  Have You Been Recently Discharged From Any Office Practice or Programs?  No  Explanation of Discharge From Practice/Program: No data recorded    CCA Screening Triage Referral Assessment Type of Contact: Face-to-Face  Is this Initial or Reassessment? No data recorded Date Telepsych consult ordered in [facility]:  No data recorded Time Telepsych consult ordered in [facility]:  No data recorded  Patient Reported Information Reviewed? Yes  Patient Left Without Being Seen? No data recorded Reason for Not Completing Assessment: No data recorded  Collateral Involvement: No data recorded  Does Patient Have a Court Appointed Legal Guardian? No data recorded Name and Contact of Legal Guardian: No data recorded If Minor and Not Living with Parent(s), Who has Custody? No data recorded Is CPS involved or ever been involved? Never  Is APS involved or ever been involved? Never   Patient Determined To Be At Risk for Harm To Self or Others Based on Review of Patient Reported Information or Presenting Complaint? Yes, for Self-Harm  Method: No data recorded Availability of Means: No data recorded Intent: No data recorded Notification Required: No data recorded Additional Information for Danger to Others Potential: No data recorded Additional Comments for Danger to Others Potential: No data recorded Are There Guns or Other Weapons in Your Home? No data recorded Types of Guns/Weapons: No data recorded Are These Weapons Safely Secured?                            No data recorded Who Could Verify You Are Able To Have These Secured: No data recorded Do You Have any Outstanding Charges, Pending Court Dates, Parole/Probation? No data recorded Contacted To Inform of Risk of Harm To Self or Others: No data recorded  Location of Assessment: [facility] ED   Does Patient Present under Involuntary Commitment? Yes  IVC Papers Initial File Date: 03/30/2020   [place] of Residence: [place]   Patient Currently Receiving the Following Services: Not Receiving Services   Determination of  Need: Urgent (48 hours)   Options For Referral: Inpatient Hospitalization     CCA Biopsychosocial Intake/Chief Complaint:  Suicidal ideation;  Current Symptoms/Problems: Pt reported sx of extreme grief as his fiance' overdosed on heroin 3 weeks ago per pt report.   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  Preferences: None noted  Abilities: Pt is educated with occupational skills   Type of Services Patient Feels are Needed: None noted   Initial Clinical Notes/Concerns: Pt is visibly depressed and tearful upon interview.   Mental Health Symptoms Depression:  Tearfulness; Hopelessness   Duration of Depressive symptoms: Greater than two weeks   Mania:  None   Anxiety:   Worrying   Psychosis:  None   Duration of Psychotic symptoms: No data recorded  Trauma:  None   Obsessions:  None   Compulsions:  None   Inattention:  None   Hyperactivity/Impulsivity:  N/A   Oppositional/Defiant Behaviors:  None   Emotional Irregularity:  Potentially harmful impulsivity   Other Mood/Personality Symptoms:  No data recorded   Mental Status Exam Appearance and self-care  Stature:  Small   Weight:  Thin   Clothing:  No data recorded  Grooming:  Neglected   Cosmetic use:  None   Posture/gait:  Normal   Motor activity:  Not Remarkable   Sensorium  Attention:  Normal   Concentration:  Preoccupied   Orientation:  X5   Recall/memory:  Normal   Affect and Mood  Affect:  Depressed   Mood:  Depressed; Dysphoric; Hopeless   Relating  Eye contact:  Avoided   Facial expression:  Sad   Attitude toward examiner:  Cooperative   Thought and Language  Speech flow: Clear and Coherent   Thought content:  Appropriate to Mood and Circumstances   Preoccupation:  Guilt; Suicide   Hallucinations:  None   Organization:  No data recorded  [employer]  of Knowledge:  Good   Intelligence:  Average   Abstraction:  Normal   Judgement:  Impaired   Reality Testing:  Adequate   Insight:  Present   Decision Making:  Impulsive   Social Functioning  Social Maturity:  Impulsive   Social Judgement:  Normal   Stress  Stressors:  Grief/losses   Coping Ability:  Deficient supports; Overwhelmed   Skill Deficits:  Self-control   Supports:  Support needed     Religion: Religion/Spirituality Are You A Religious Person?: No  Leisure/Recreation: Leisure / Recreation Do You Have Hobbies?: No  Exercise/Diet: Exercise/Diet Do You Exercise?: No Have You Gained or Lost A Significant Amount of Weight in the Past Six Months?: No Do You Follow a Special Diet?: No Do You Have Any Trouble Sleeping?: No   CCA Employment/Education Employment/Work Situation: Employment / Work Situation Employment situation: Employed 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 What is the longest time patient has a held a job?: None noted Where was the patient employed at that time?: None noted Has patient ever been in the [employer]?: No  Education: Education Is Patient Currently Attending School?: No Did Garment/textile technologist From [employer]?: Yes Did Theme park manager?: Yes What Type of College Degree Do you Have?: 2 Associate Degrees Did You Attend Graduate School?: No Did You Have An Individualized Education Program (IIEP): No Did You Have Any Difficulty At School?: No Patient's Education Has Been Impacted by Current Illness: No   CCA Family/Childhood History Family and Relationship History: Family history Marital status: Single Are you sexually active?: No What is your sexual orientation?:  Heterosexual Has your sexual activity been affected by drugs, alcohol, medication, or emotional stress?: None noted Does patient have children?: No  Childhood History:  Childhood History By whom was/is the patient raised?: Both parents Additional childhood history information: Pt reported having a good childhood Description of patient's relationship with caregiver when they were a child: Excellent Patient's description of current relationship with people who raised him/her: Not noted How were you disciplined when you got in trouble as a child/adolescent?: Reasonable punishment Does patient have siblings?: Yes Number of Siblings: 1 Description of patient's current relationship with siblings: Good Did patient suffer any verbal/emotional/physical/sexual abuse as a child?: No Did patient suffer from severe childhood neglect?: No Has patient ever been sexually abused/assaulted/raped as an adolescent or adult?: No Was the patient ever a victim of a crime or a disaster?: No Witnessed domestic violence?:  No Has patient been affected by domestic violence as an adult?: No  Child/Adolescent Assessment:     CCA Substance Use Alcohol/Drug Use: Alcohol / Drug Use Pain Medications: See PTA Prescriptions: See PTA History of alcohol / drug use?: Yes Negative Consequences of Use: Personal relationships Substance #1 Name of Substance 1: ETOH 1 - Frequency: Pt reports drinking 1-2 beers daily after work; pt reports binge drinking behavior since his fiance's death. 1 - Duration: Daily 1 - Last Use / Amount: 03/30/20; Unable to quantify Substance #2 Name of Substance 2: Cannabis 2 - Frequency: Pt reports smoking marijuana daily after work Substance #3 Name of Substance 3: Roxicet 3 - Last Use / Amount: 03/31/19/ 30 pills                   ASAM's:  Six Dimensions of Multidimensional Assessment  Dimension 1:  Acute Intoxication and/or Withdrawal Potential:      Dimension 2:  Biomedical  Conditions and Complications:      Dimension 3:  Emotional, Behavioral, or Cognitive Conditions and Complications:     Dimension 4:  Readiness to Change:     Dimension 5:  Relapse, Continued use, or Continued Problem Potential:     Dimension 6:  Recovery/Living Environment:     ASAM Severity Score:    ASAM Recommended Level of Treatment:     Substance use Disorder (SUD) Substance Use Disorder (SUD)  Checklist Symptoms of Substance Use: Substance(s) often taken in larger amounts or over longer times than was intended  Recommendations for Services/Supports/Treatments: Recommendations for Services/Supports/Treatments Recommendations For Services/Supports/Treatments: Inpatient Hospitalization  DSM5 Diagnoses: Patient Active Problem List   Diagnosis Date Noted  . Annual physical exam 10/01/2018  . Abnormal MRI, lumbar spine 01/28/2018  . Lumbar herniated disc 01/28/2018  . Cervicalgia 01/08/2018  . Depression 10/08/2017  . Insomnia 10/08/2017  . Chronic low back pain 10/08/2017  . Tobacco abuse 02/26/2012  . Headache, chronic daily 02/26/2012  . Fatigue 02/26/2012  . Anxiety 02/26/2012    Patient Centered Plan:    Referrals to Alternative Service(s): Referred to Alternative Service(s):   Place:   Date:   Time:    Referred to Alternative Service(s):   Place:   Date:   Time:    Referred to Alternative Service(s):   Place:   Date:   Time:    Referred to Alternative Service(s):   Place:   Date:   Time:     Foy Guadalajara, LCAS

## 2020-03-30 NOTE — ED Provider Notes (Signed)
[facility] Emergency Department Provider Note   ____________________________________________   Event Date/Time   First MD Initiated Contact with Patient 03/30/20 2009     (approximate)  I have reviewed the triage vital signs and the nursing notes.   HISTORY  Chief Complaint Suicidal    HPI Parker Wilson is a 45 y.o. male with past medical history of depression, anxiety, and chronic back pain who presents to the ED complaining of suicidal ideation.  Patient states that his fiance killed herself 3 weeks ago by overdosing and since then he has felt like he has nothing to live for.  He reports thoughts of suicide and overdosing himself.  He reports taking large amounts of oxycodone in order to "numb the pain" but has not actually attempted to end his life.  He does report being a regular drinker and in the past has been able to control the amount he consumes, but recently has been binge drinking.  He states he last had something to drink around 330 this afternoon, when he also took 10 mg of oxycodone.  He denies any medical complaints at this time.        Past Medical History:  Diagnosis Date  . Anxiety   . Chronic back pain   . Chronic headaches   . Depression   . Insomnia   . Scoliosis   . Vitamin D deficiency     Patient Active Problem List   Diagnosis Date Noted  . Annual physical exam 10/01/2018  . Abnormal MRI, lumbar spine 01/28/2018  . Lumbar herniated disc 01/28/2018  . Cervicalgia 01/08/2018  . Depression 10/08/2017  . Insomnia 10/08/2017  . Chronic low back pain 10/08/2017  . Tobacco abuse 02/26/2012  . Headache, chronic daily 02/26/2012  . Fatigue 02/26/2012  . Anxiety 02/26/2012    Past Surgical History:  Procedure Laterality Date  . tubes in ears     age 22 or 45 y.o     Prior to Admission medications   Medication Sig Start Date End Date Taking? Authorizing Provider  clonazePAM (KLONOPIN) 0.5 MG tablet Take 1 tablet (0.5 mg  total) by mouth 2 (two) times daily as needed for anxiety. And sleep APPT further refills. Call to schedule 03/15/20   McLean-Scocuzza, Pasty Spillers, MD  cyclobenzaprine (FLEXERIL) 10 MG tablet Take 1 tablet (10 mg total) by mouth 2 (two) times daily as needed for muscle spasms. 03/15/20   McLean-Scocuzza, Pasty Spillers, MD  ibuprofen (ADVIL) 200 MG tablet Take 200 mg by mouth every 6 (six) hours as needed.    [provider]    Allergies Gabapentin  Family History  Problem Relation Age of Onset  . Heart disease Father        cabg x 3   . Hypertension Father   . Arthritis Father   . Depression Father   . Hyperlipidemia Father   . Crohn's disease Father   . Hypertension Brother   . Alcohol abuse Brother   . Depression Brother   . Hyperlipidemia Brother   . Cancer Paternal Aunt        breast cancer  . Cancer Paternal Grandfather        Lung cancer  . Alcohol abuse Paternal Grandfather   . Arthritis Paternal Grandfather   . COPD Paternal Grandfather   . Early death Paternal Grandfather   . Heart disease Paternal Grandfather        cabg x 4   . Hyperlipidemia Paternal Grandfather   .  Hypertension Paternal Grandfather   . Other Paternal Grandfather        died covid 06/03/19  . Arthritis Mother        RA  . Arthritis Paternal Grandmother   . Depression Paternal Grandmother   . Seizures Paternal Grandmother     Social History Social History   Tobacco Use  . Smoking status: Current Every Day Smoker    Packs/day: 1.50    Years: 15.00    Pack years: 22.50    Types: Cigarettes  . Smokeless tobacco: Never Used  . Tobacco comment: x 12-13 years as of 10/08/17   Substance Use Topics  . Alcohol use: Yes    Alcohol/week: 6.0 standard drinks    Types: 6 Cans of beer per week  . Drug use: Yes    Types: Marijuana    Comment: Occasional use approximately every 2-3 weeks    Review of Systems  Constitutional: No fever/chills Eyes: No visual changes. ENT: No sore  throat. Cardiovascular: Denies chest pain. Respiratory: Denies shortness of breath. Gastrointestinal: No abdominal pain.  No nausea, no vomiting.  No diarrhea.  No constipation. Genitourinary: Negative for dysuria. Musculoskeletal: Negative for back pain. Skin: Negative for rash. Neurological: Negative for headaches, focal weakness or numbness.  Positive for depression and suicidal ideation.  ____________________________________________   PHYSICAL EXAM:  VITAL SIGNS: ED Triage Vitals [03/30/20 1954]  Enc Vitals Group     BP      Pulse      Resp      Temp      Temp src      SpO2      Weight 150 lb (68 kg)     Height 5\' 10"  (1.778 m)     Head Circumference      Peak Flow      Pain Score 0     Pain Loc      Pain Edu?      Excl. in GC?     Constitutional: Alert and oriented. Eyes: Conjunctivae are normal. Head: Atraumatic. Nose: No congestion/rhinnorhea. Mouth/Throat: Mucous membranes are moist. Neck: Normal ROM Cardiovascular: Normal rate, regular rhythm. Grossly normal heart sounds. Respiratory: Normal respiratory effort.  No retractions. Lungs CTAB. Gastrointestinal: Soft and nontender. No distention. Genitourinary: deferred Musculoskeletal: No lower extremity tenderness nor edema. Neurologic:  Normal speech and language. No gross focal neurologic deficits are appreciated. Skin:  Skin is warm, dry and intact. No rash noted. Psychiatric: Depressed mood. Speech and behavior are normal.  ____________________________________________   LABS (all labs ordered are listed, but only abnormal results are displayed)  Labs Reviewed  RESP PANEL BY RT-PCR (FLU A&B, COVID) [id number]  CBC  COMPREHENSIVE METABOLIC PANEL  ETHANOL  URINE DRUG SCREEN, QUALITATIVE ([facility] ONLY)  URINALYSIS, COMPLETE (UACMP) WITH MICROSCOPIC  ACETAMINOPHEN LEVEL  SALICYLATE LEVEL    PROCEDURES  Procedure(s) performed (including Critical  Care):  Procedures   ____________________________________________   INITIAL IMPRESSION / ASSESSMENT AND PLAN / ED COURSE       45 year old male with past medical history of depression, anxiety, and chronic back pain who presents to the ED with suicidal ideation and thoughts of overdosing after his fiance recently took her own life.  Patient does appear to be high risk for suicide and was placed under IVC.  He denies any medical complaints at this time, we will check screening labs but if these are unremarkable he may be cleared for psychiatric disposition.  He was placed on CIWA protocol given his recent  heavy drinking.  Psychiatry consult pending.      ____________________________________________   FINAL CLINICAL IMPRESSION(S) / ED DIAGNOSES  Final diagnoses:  Suicidal ideation     ED Discharge Orders    None       Note:  This document was prepared using Dragon voice recognition software and may include unintentional dictation errors.   Chesley Noon, MD 03/30/20 2024

## 2020-03-30 NOTE — ED Notes (Signed)
Hourly rounding completed at this time, patient currently asleep in hallway bed. No complaints, stable, and in no acute distress. Q15 minute rounds and monitoring via Rover and Officer to continue. 

## 2020-03-30 NOTE — ED Notes (Signed)
Hourly rounding completed at this time, patient currently awake in hallway bed. No complaints, stable, and in no acute distress. Q15 minute rounds and monitoring via Rover and Officer to continue. 

## 2020-03-30 NOTE — Consult Note (Signed)
[facility] Face-to-Face Psychiatry Consult   Reason for Consult:Suicidal Referring Physician: Dr. Larinda Buttery Patient Identification: Parker Wilson MRN:  [id number] Principal Diagnosis: <principal problem not specified> Diagnosis:  Active Problems:   Tobacco abuse   Anxiety   Depression   Insomnia   Total Time spent with patient: 20 minutes  Subjective: " My fianc overdosed on heroin three weeks ago."  Parker Wilson is a 45 y.o. male patient presented to [facility] ED due to complaints of suicidal ideation with a plan.  The patient was initially voluntary but was placed under involuntary commitment status (IVC) by the EDP.  Per the ED triage nurse note, Pt states fiancee killed herself 3 weeks ago and has been suicidal since. Has been taking roxicodone daily in large amounts since to "numb the pain".  A couple of weeks ago, the patient disclosed that he and his fiance got into an argument due to her using heroin at home and refusing to stop. The patient asked her to leave their home until she got some help. The patient disclosed during the separation; his fiance overdosed and passed away. The patient also revealed that he has charges which he has a court date for 3.7.22. He stated the charges were assault  And something else which the violence was towards his fiance. The patient says, "I chewed up 30 Roxie daily. Which I had my drug use under control."  He disclosed he has been in substance abuse treatment in the past.  The patient was seen face-to-face by this provider; the chart was reviewed and consulted with Dr. Larinda Buttery on 03/30/2020 due to the patient's care. On evaluation, the patient is alert and oriented x 4, calm, cooperative, and mood-congruent with affect. It was discussed with the EDP that the patient does meet the criteria to be admitted to the psychiatric inpatient unit.  The patient does not appear to be responding to internal or external stimuli. Neither is the patient presenting with any  delusional thinking. The patient denies auditory or visual hallucinations. The patient admits to suicidal ideation with a plan but refuses to elaborate on it. The patient denies homicidal or self-harm ideations. The patient is not presenting with any psychotic or paranoid behaviors. During an encounter with the patient, he answered questions appropriately. .   Plan: The patient is a safety risk to self and requires psychiatric inpatient admission for stabilization and treatment.  HPI: Per Dr. Larinda Buttery, Parker Wilson is a 45 y.o. male with past medical history of depression, anxiety, and chronic back pain who presents to the ED complaining of suicidal ideation.  Patient states that his fiance killed herself 3 weeks ago by overdosing and since then he has felt like he has nothing to live for.  He reports thoughts of suicide and overdosing himself.  He reports taking large amounts of oxycodone in order to "numb the pain" but has not actually attempted to end his life.  He does report being a regular drinker and in the past has been able to control the amount he consumes, but recently has been binge drinking.  He states he last had something to drink around 330 this afternoon, when he also took 10 mg of oxycodone.  He denies any medical complaints at this time.  Past Psychiatric History: Anxiety Depression Insomnia  Risk to Self:  Yes Risk to Others:  No Prior Inpatient Therapy:  Yes Prior Outpatient Therapy:  Yes  Past Medical History:  Past Medical History:  Diagnosis Date  .  Anxiety   . Chronic back pain   . Chronic headaches   . Depression   . Insomnia   . Scoliosis   . Vitamin D deficiency     Past Surgical History:  Procedure Laterality Date  . tubes in ears     age 71 or 45 y.o    Family History:  Family History  Problem Relation Age of Onset  . Heart disease Father        cabg x 3   . Hypertension Father   . Arthritis Father   . Depression Father   . Hyperlipidemia Father   .  Crohn's disease Father   . Hypertension Brother   . Alcohol abuse Brother   . Depression Brother   . Hyperlipidemia Brother   . Cancer Paternal Aunt        breast cancer  . Cancer Paternal Grandfather        Lung cancer  . Alcohol abuse Paternal Grandfather   . Arthritis Paternal Grandfather   . COPD Paternal Grandfather   . Early death Paternal Grandfather   . Heart disease Paternal Grandfather        cabg x 4   . Hyperlipidemia Paternal Grandfather   . Hypertension Paternal Grandfather   . Other Paternal Grandfather        died covid Jun 02, 2019  . Arthritis Mother        RA  . Arthritis Paternal Grandmother   . Depression Paternal Grandmother   . Seizures Paternal Grandmother    Family Psychiatric  History:  Social History:  Social History   Substance and Sexual Activity  Alcohol Use Yes  . Alcohol/week: 6.0 standard drinks  . Types: 6 Cans of beer per week     Social History   Substance and Sexual Activity  Drug Use Yes  . Types: Marijuana   Comment: Occasional use approximately every 2-3 weeks    Social History   Socioeconomic History  . Marital status: Divorced    Spouse name: Not on file  . Number of children: Not on file  . Years of education: Not on file  . Highest education level: Not on file  Occupational History  . Not on file  Tobacco Use  . Smoking status: Current Every Day Smoker    Packs/day: 1.50    Years: 15.00    Pack years: 22.50    Types: Cigarettes  . Smokeless tobacco: Never Used  . Tobacco comment: x 12-13 years as of 10/08/17   Substance and Sexual Activity  . Alcohol use: Yes    Alcohol/week: 6.0 standard drinks    Types: 6 Cans of beer per week  . Drug use: Yes    Types: Marijuana    Comment: Occasional use approximately every 2-3 weeks  . Sexual activity: Yes  Other Topics Concern  . Not on file  Social History Narrative   No guns    Wears seat belt    Single   As of 03/15/20 drinking 2-4 beers daily and 1 ppd cigs    Social Determinants of Health   Financial Resource Strain: Not on file  Food Insecurity: Not on file  Transportation Needs: Not on file  Physical Activity: Not on file  Stress: Not on file  Social Connections: Not on file   Additional Social History:    Allergies:   Allergies  Allergen Reactions  . Gabapentin Nausea Only    Labs:  Results for orders placed or performed during the hospital  encounter of 03/30/20 (from the past 48 hour(s))  CBC     Status: Abnormal   Collection Time: 03/30/20  7:57 PM  Result Value Ref Range   WBC 9.3 4.0 - 10.5 K/uL   RBC 3.84 (L) 4.22 - 5.81 MIL/uL   Hemoglobin 12.2 (L) 13.0 - 17.0 g/dL   HCT 16.136.3 (L) 09.639.0 - 04.552.0 %   MCV 94.5 80.0 - 100.0 fL   MCH 31.8 26.0 - 34.0 pg   MCHC 33.6 30.0 - 36.0 g/dL   RDW 40.913.4 81.111.5 - 91.415.5 %   Platelets 298 150 - 400 K/uL   nRBC 0.0 0.0 - 0.2 %    Comment: Performed at [facility], [address] [place], [place] [address]  Comprehensive metabolic panel     Status: Abnormal   Collection Time: 03/30/20  7:57 PM  Result Value Ref Range   Sodium 139 135 - 145 mmol/L   Potassium 4.2 3.5 - 5.1 mmol/L   Chloride 102 98 - 111 mmol/L   CO2 24 22 - 32 mmol/L   Glucose, Bld 120 (H) 70 - 99 mg/dL    Comment: Glucose reference range applies only to samples taken after fasting for at least 8 hours.   BUN 13 6 - 20 mg/dL   Creatinine, Ser 6.210.77 0.61 - 1.24 mg/dL   Calcium 9.1 8.9 - 30.810.3 mg/dL   Total Protein 7.5 6.5 - 8.1 g/dL   Albumin 4.2 3.5 - 5.0 g/dL   AST 49 (H) 15 - 41 U/L   ALT 32 0 - 44 U/L   Alkaline Phosphatase 43 38 - 126 U/L   Total Bilirubin 0.4 0.3 - 1.2 mg/dL   GFR, Estimated >65>60 >78>60 mL/min    Comment: (NOTE) Calculated using the CKD-EPI Creatinine Equation (2021)    Anion gap 13 5 - 15    Comment: Performed at [facility], [address] [place], [place] [address]  Ethanol     Status: Abnormal   Collection Time: 03/30/20  7:57 PM  Result Value Ref Range   Alcohol,  Ethyl (B) 63 (H) <10 mg/dL    Comment: (NOTE) Lowest detectable limit for serum alcohol is 10 mg/dL.  For medical purposes only. Performed at [facility], [address] [place], [place] [address]   Acetaminophen level     Status: Abnormal   Collection Time: 03/30/20  8:19 PM  Result Value Ref Range   Acetaminophen (Tylenol), Serum <10 (L) 10 - 30 ug/mL    Comment: (NOTE) Therapeutic concentrations vary significantly. A range of 10-30 ug/mL  may be an effective concentration for many patients. However, some  are best treated at concentrations outside of this range. Acetaminophen concentrations >150 ug/mL at 4 hours after ingestion  and >50 ug/mL at 12 hours after ingestion are often associated with  toxic reactions.  Performed at [facility], [address] [place], [place] [address]   Salicylate level     Status: Abnormal   Collection Time: 03/30/20  8:19 PM  Result Value Ref Range   Salicylate Lvl <7.0 (L) 7.0 - 30.0 mg/dL    Comment: Performed at [facility], [address] [place], [place] [address]  Resp Panel by RT-PCR (Flu A&B, Covid) Nasopharyngeal Swab     Status: None   Collection Time: 03/30/20  8:21 PM   Specimen: Nasopharyngeal Swab; Nasopharyngeal(NP) swabs in vial transport medium  Result Value Ref Range   SARS Coronavirus 2 by RT PCR NEGATIVE  NEGATIVE    Comment: (NOTE) SARS-CoV-2 target nucleic acids are NOT DETECTED.  The SARS-CoV-2 RNA is generally detectable in upper respiratory specimens during the acute phase of infection. The lowest concentration of SARS-CoV-2 viral copies this assay can detect is 138 copies/mL. A negative result does not preclude SARS-Cov-2 infection and should not be used as the sole basis for treatment or other patient management decisions. A negative result may occur with  improper specimen collection/handling, submission of specimen other than nasopharyngeal swab, presence of viral mutation(s)  within the areas targeted by this assay, and inadequate number of viral copies(<138 copies/mL). A negative result must be combined with clinical observations, patient history, and epidemiological information. The expected result is Negative.  Fact Sheet for Patients:  BloggerCourse.com  Fact Sheet for Healthcare Providers:  SeriousBroker.it  This test is no t yet approved or cleared by the [place] FDA and  has been authorized for detection and/or diagnosis of SARS-CoV-2 by FDA under an Emergency Use Authorization (EUA). This EUA will remain  in effect (meaning this test can be used) for the duration of the COVID-19 declaration under Section 564(b)(1) of the Act, 21 U.S.C.section 360bbb-3(b)(1), unless the authorization is terminated  or revoked sooner.       Influenza A by PCR NEGATIVE NEGATIVE   Influenza B by PCR NEGATIVE NEGATIVE    Comment: (NOTE) The Xpert Xpress SARS-CoV-2/FLU/RSV plus assay is intended as an aid in the diagnosis of influenza from Nasopharyngeal swab specimens and should not be used as a sole basis for treatment. Nasal washings and aspirates are unacceptable for Xpert Xpress SARS-CoV-2/FLU/RSV testing.  Fact Sheet for Patients: BloggerCourse.com  Fact Sheet for Healthcare Providers: SeriousBroker.it  This test is not yet approved or cleared by the [place] FDA and has been authorized for detection and/or diagnosis of SARS-CoV-2 by FDA under an Emergency Use Authorization (EUA). This EUA will remain in effect (meaning this test can be used) for the duration of the COVID-19 declaration under Section 564(b)(1) of the Act, 21 U.S.C. section 360bbb-3(b)(1), unless the authorization is terminated or revoked.  Performed at [facility], [address] [place], [place] [address]     Current Facility-Administered Medications   Medication Dose Route Frequency Provider Last Rate Last Admin  . LORazepam (ATIVAN) injection 0-4 mg  0-4 mg Intravenous Q6H Chesley Noon, MD       Or  . LORazepam (ATIVAN) tablet 0-4 mg  0-4 mg Oral Q6H Chesley Noon, MD      . Melene Muller ON 04/02/2020] LORazepam (ATIVAN) injection 0-4 mg  0-4 mg Intravenous Q12H Chesley Noon, MD       Or  . Melene Muller ON 04/02/2020] LORazepam (ATIVAN) tablet 0-4 mg  0-4 mg Oral Q12H Chesley Noon, MD      . thiamine tablet 100 mg  100 mg Oral Daily Chesley Noon, MD   100 mg at 03/30/20 2121   Or  . thiamine (B-1) injection 100 mg  100 mg Intravenous Daily Chesley Noon, MD       Current Outpatient Medications  Medication Sig Dispense Refill  . clonazePAM (KLONOPIN) 0.5 MG tablet Take 1 tablet (0.5 mg total) by mouth 2 (two) times daily as needed for anxiety. And sleep APPT further refills. Call to schedule 60 tablet 5  . cyclobenzaprine (FLEXERIL) 10 MG tablet Take 1 tablet (10 mg total) by mouth 2 (two) times daily as needed for muscle spasms. 60 tablet 5  . ibuprofen (ADVIL) 200 MG tablet Take 200  mg by mouth every 6 (six) hours as needed.      Musculoskeletal: Strength & Muscle Tone: within normal limits Gait & Station: normal Patient leans: N/A  Psychiatric Specialty Exam: Physical Exam Vitals and nursing note reviewed.  HENT:     Nose: Nose normal.  Eyes:     Conjunctiva/sclera: Conjunctivae normal.  Cardiovascular:     Rate and Rhythm: Normal rate.     Pulses: Normal pulses.  Pulmonary:     Effort: Pulmonary effort is normal.  Musculoskeletal:        General: Normal range of motion.     Cervical back: Normal range of motion.  Neurological:     General: No focal deficit present.     Mental Status: He is alert and oriented to person, place, and time.  Psychiatric:        Attention and Perception: Attention and perception normal.        Mood and Affect: Mood is depressed. Affect is flat.        Speech: Speech normal.         Behavior: Behavior normal. Behavior is cooperative.        Thought Content: Thought content normal.        Cognition and Memory: Cognition and memory normal.        Judgment: Judgment normal.     Review of Systems  Psychiatric/Behavioral: Positive for suicidal ideas. The patient is nervous/anxious.   All other systems reviewed and are negative.   Blood pressure 105/71, pulse 78, temperature 97.8 F (36.6 C), resp. rate 16, height 5\' 10"  (1.778 m), weight 68 kg, SpO2 99 %.Body mass index is 21.52 kg/m.  General Appearance: Casual  Eye Contact:  Good  Speech:  Clear and Coherent  Volume:  Normal  Mood:  Anxious, Depressed and Hopeless  Affect:  Blunt, Congruent and Depressed  Thought Process:  Coherent  Orientation:  Full (Time, Place, and Person)  Thought Content:  WDL and Logical  Suicidal Thoughts:  Yes.  with intent/plan  Homicidal Thoughts:  No  Memory:  Immediate;   Good Recent;   Good Remote;   Good  Judgement:  Poor  Insight:  Fair  Psychomotor Activity:  Normal  Concentration:  Concentration: Good and Attention Span: Good  Recall:  Good  Fund of Knowledge:  Good  Language:  Good  Akathisia:  Negative  Handed:  Right  AIMS (if indicated):     Assets:  Communication Skills Desire for Improvement Intimacy Resilience Social Support  ADL's:  Intact  Cognition:  WNL  Sleep:      Treatment Plan Summary: Medication management and Plan Patient meets criteria for psychiatric inpatient admission.  Disposition: Recommend psychiatric Inpatient admission when medically cleared. Supportive therapy provided about ongoing stressors. The patient is a safety risk to self and requires psychiatric inpatient admission for stabilization and treatment. , NP 03/30/2020 9:40 PM

## 2020-03-30 NOTE — BH Assessment (Signed)
Patient is to be admitted to [facility] by Psychiatric Nurse Practitioner Gillermo Murdoch.  Attending Physician will be Dr. Neale Burly.   Patient has been assigned to room 306, by [place] Charge Nurse [place].   Intake Paper Work has been signed and placed on patient chart.  ER staff is aware of the admission:  Glenda, ER Secretary    Dr. Larinda Buttery, ER MD   Thayer Ohm, Patient's Nurse   Rosey Bath, Patient Access.

## 2020-03-30 NOTE — ED Notes (Signed)
Pt has urine cup and is aware of need for Urine sample. Provided with snacks.

## 2020-03-30 NOTE — ED Triage Notes (Signed)
Pt states fiancee killed herself 3 weeks ago and has been suicidal since. Has been taking roxicodone daily in large amounts since to "numb the pain".

## 2020-03-31 DIAGNOSIS — Z720 Tobacco use: Secondary | ICD-10-CM | POA: Diagnosis not present

## 2020-03-31 DIAGNOSIS — F332 Major depressive disorder, recurrent severe without psychotic features: Secondary | ICD-10-CM | POA: Diagnosis present

## 2020-03-31 LAB — URINE DRUG SCREEN, QUALITATIVE (ARMC ONLY)
Amphetamines, Ur Screen: NOT DETECTED
Barbiturates, Ur Screen: NOT DETECTED
Benzodiazepine, Ur Scrn: POSITIVE — AB
Cannabinoid 50 Ng, Ur ~~LOC~~: POSITIVE — AB
Cocaine Metabolite,Ur ~~LOC~~: POSITIVE — AB
MDMA (Ecstasy)Ur Screen: NOT DETECTED
Methadone Scn, Ur: NOT DETECTED
Opiate, Ur Screen: NOT DETECTED
Phencyclidine (PCP) Ur S: NOT DETECTED
Tricyclic, Ur Screen: POSITIVE — AB

## 2020-03-31 MED ORDER — TRAZODONE HCL 100 MG PO TABS
100.0000 mg | ORAL_TABLET | Freq: Every day | ORAL | Status: DC
  Administered 2020-03-31 – 2020-04-01 (×2): 100 mg via ORAL
  Filled 2020-03-31 (×2): qty 1

## 2020-03-31 MED ORDER — THIAMINE HCL 100 MG PO TABS
100.0000 mg | ORAL_TABLET | Freq: Every day | ORAL | Status: DC
  Administered 2020-03-31 – 2020-04-02 (×3): 100 mg via ORAL
  Filled 2020-03-31 (×3): qty 1

## 2020-03-31 MED ORDER — ALUM & MAG HYDROXIDE-SIMETH 200-200-20 MG/5ML PO SUSP: 30.0000 mL | ORAL | Status: DC | PRN

## 2020-03-31 MED ORDER — MAGNESIUM HYDROXIDE 400 MG/5ML PO SUSP: 30.0000 mL | Freq: Every day | ORAL | Status: DC | PRN

## 2020-03-31 MED ORDER — NICOTINE POLACRILEX 2 MG MT GUM
2.0000 mg | CHEWING_GUM | OROMUCOSAL | Status: DC | PRN
  Administered 2020-03-31 – 2020-04-02 (×5): 2 mg via ORAL
  Filled 2020-03-31 (×6): qty 1

## 2020-03-31 MED ORDER — ACETAMINOPHEN 325 MG PO TABS
650.0000 mg | ORAL_TABLET | Freq: Four times a day (QID) | ORAL | Status: DC | PRN
  Administered 2020-04-01: 650 mg via ORAL
  Filled 2020-03-31 (×2): qty 2

## 2020-03-31 MED ORDER — LOPERAMIDE HCL 2 MG PO CAPS: 2.0000 mg | ORAL_CAPSULE | Freq: Three times a day (TID) | ORAL | Status: DC | PRN

## 2020-03-31 MED ORDER — MELATONIN 5 MG PO TABS
2.5000 mg | ORAL_TABLET | Freq: Every day | ORAL | Status: DC
  Administered 2020-03-31 – 2020-04-01 (×2): 2.5 mg via ORAL
  Filled 2020-03-31 (×2): qty 1

## 2020-03-31 MED ORDER — THIAMINE HCL 100 MG/ML IJ SOLN: 100.0000 mg | Freq: Every day | INTRAMUSCULAR | Status: DC

## 2020-03-31 MED ORDER — NICOTINE 14 MG/24HR TD PT24
14.0000 mg | MEDICATED_PATCH | Freq: Every day | TRANSDERMAL | Status: DC
  Administered 2020-03-31 – 2020-04-02 (×3): 14 mg via TRANSDERMAL
  Filled 2020-03-31 (×3): qty 1

## 2020-03-31 NOTE — H&P (Signed)
Psychiatric Admission Assessment Adult  Patient Identification: Parker Wilson. MRN:  [id number] Date of Evaluation:  03/31/2020 Chief Complaint:  MDD (major depressive disorder), recurrent episode, severe (HCC) [F33.2] Principal Diagnosis: Tobacco abuse Diagnosis:  Principal Problem:   Tobacco abuse Active Problems:   MDD (major depressive disorder), recurrent episode, severe (HCC)  History of Present Illness: 45 year old male with history of MDD presenting with worsening depression in context of grief and substance abuse. Patient notes that he was previously in a relationship with his fiance, Mayer Camel, who began using hard substances and heroin. He tried to help her get clean several times, but ultimately asked her to leave the house due to persistent drug use. He states that at the time she said she needed him to help her, or she would kill herself. However, he stood firm and made her leave. She ultimately passed away via overdose two weeks later on Dec 21. He now blames himself for her death, and feels guilty for not doing more to save her. Since then he felt several symptoms of depression including hopelessness, helplessness, worthlessness, guilt, anhedonia, poor sleep, poor appetite, and recurrent suicidal thoughts. He notes he starting using up to 30 roxicodone per day to numb the pain over the lose. This started causing him trouble at work due to coming in high. He ultimately tried to taper off the roxi and started experiencing severe withdrawals, and confided in his parents that he needed help. He has a history of depression and has tried multiple SSRIs in the past and also wellbutrin which he found unhelpful. He also has abused Xanax and Klonopin in the past, and does not wish to resume any benzodiazepines. At this time, patient hoping to stay in the hospital for acute detox from substances. He would also like to process through his depression and grief without medications. Approximately 40  minutes spent with patient providing grief counciling, and supportive psychotherapy. Also introduced some brief CBT techniques to delve into some unhelpful automatic thought patterns noted during our session. He was given homework assignment to 1) list his positive social supports, and 2) list times and actions taken to help his fiance during his relationship Associated Signs/Symptoms: Depression Symptoms:  depressed mood, anhedonia, insomnia, feelings of worthlessness/guilt, difficulty concentrating, hopelessness, recurrent thoughts of death, suicidal thoughts with specific plan, loss of energy/fatigue, decreased appetite, Duration of Depression Symptoms: Greater than two weeks  (Hypo) Manic Symptoms:  Impulsivity, Anxiety Symptoms:  Excessive Worry, Psychotic Symptoms:  Deneis Duration of Psychotic Symptoms: No data recorded PTSD Symptoms: Negative Total Time spent with patient: 1 hour  Past Psychiatric History: History of MDD, GAD, and insomnia. 1 prior inpatient admission for two weeks to acutely detox from Xanax. No history of suicide attempts.   Is the patient at risk to self? Yes.    Has the patient been a risk to self in the past 6 months? No.  Has the patient been a risk to self within the distant past? No.  Is the patient a risk to others? No.  Has the patient been a risk to others in the past 6 months? No.  Has the patient been a risk to others within the distant past? No.   Prior Inpatient Therapy:   Prior Outpatient Therapy:    Alcohol Screening: 1. How often do you have a drink containing alcohol?: 2 to 4 times a month 2. How many drinks containing alcohol do you have on a typical day when you are drinking?: 3 or 4 3.  How often do you have six or more drinks on one occasion?: Monthly AUDIT-C Score: 5 4. How often during the last year have you found that you were not able to stop drinking once you had started?: Never 5. How often during the last year have you  failed to do what was normally expected from you because of drinking?: Never 6. How often during the last year have you needed a first drink in the morning to get yourself going after a heavy drinking session?: Never 7. How often during the last year have you had a feeling of guilt of remorse after drinking?: Never 8. How often during the last year have you been unable to remember what happened the night before because you had been drinking?: Never 9. Have you or someone else been injured as a result of your drinking?: No 10. Has a relative or friend or a doctor or another health worker been concerned about your drinking or suggested you cut down?: No Alcohol Use Disorder Identification Test Final Score (AUDIT): 5 Alcohol Brief Interventions/Follow-up: AUDIT Score <7 follow-up not indicated Substance Abuse History in the last 12 months:  Yes.   Consequences of Substance Abuse: Withdrawal Symptoms:   Diarrhea Nausea Previous Psychotropic Medications: Yes  Psychological Evaluations: No  Past Medical History:  Past Medical History:  Diagnosis Date  . Anxiety   . Chronic back pain   . Chronic headaches   . Depression   . Insomnia   . Scoliosis   . Vitamin D deficiency     Past Surgical History:  Procedure Laterality Date  . tubes in ears     age 65 or 45 y.o    Family History:  Family History  Problem Relation Age of Onset  . Heart disease Father        cabg x 3   . Hypertension Father   . Arthritis Father   . Depression Father   . Hyperlipidemia Father   . Crohn's disease Father   . Hypertension Brother   . Alcohol abuse Brother   . Depression Brother   . Hyperlipidemia Brother   . Cancer Paternal Aunt        breast cancer  . Cancer Paternal Grandfather        Lung cancer  . Alcohol abuse Paternal Grandfather   . Arthritis Paternal Grandfather   . COPD Paternal Grandfather   . Early death Paternal Grandfather   . Heart disease Paternal Grandfather        cabg x 4   .  Hyperlipidemia Paternal Grandfather   . Hypertension Paternal Grandfather   . Other Paternal Grandfather        died covid 05/30/2019  . Arthritis Mother        RA  . Arthritis Paternal Grandmother   . Depression Paternal Grandmother   . Seizures Paternal Grandmother    Family Psychiatric  History: Father, brother, PGM with depression. Brother with alcohol use disorder Tobacco Screening: Have you used any form of tobacco in the last 30 days? (Cigarettes, Smokeless Tobacco, Cigars, and/or Pipes): No Social History:  Social History   Substance and Sexual Activity  Alcohol Use Yes  . Alcohol/week: 6.0 standard drinks  . Types: 6 Cans of beer per week     Social History   Substance and Sexual Activity  Drug Use Yes  . Types: Marijuana   Comment: Occasional use approximately every 2-3 weeks    Additional Social History: Marital status: Single Does patient have  children?: No                         Allergies:   Allergies  Allergen Reactions  . Gabapentin Nausea Only   Lab Results:  Results for orders placed or performed during the hospital encounter of 03/30/20 (from the past 48 hour(s))  CBC     Status: Abnormal   Collection Time: 03/30/20  7:57 PM  Result Value Ref Range   WBC 9.3 4.0 - 10.5 K/uL   RBC 3.84 (L) 4.22 - 5.81 MIL/uL   Hemoglobin 12.2 (L) 13.0 - 17.0 g/dL   HCT 87.6 (L) 81.1 - 57.2 %   MCV 94.5 80.0 - 100.0 fL   MCH 31.8 26.0 - 34.0 pg   MCHC 33.6 30.0 - 36.0 g/dL   RDW 62.0 35.5 - 97.4 %   Platelets 298 150 - 400 K/uL   nRBC 0.0 0.0 - 0.2 %    Comment: Performed at [facility], [address] [place], [place] [address]  Comprehensive metabolic panel     Status: Abnormal   Collection Time: 03/30/20  7:57 PM  Result Value Ref Range   Sodium 139 135 - 145 mmol/L   Potassium 4.2 3.5 - 5.1 mmol/L   Chloride 102 98 - 111 mmol/L   CO2 24 22 - 32 mmol/L   Glucose, Bld 120 (H) 70 - 99 mg/dL    Comment: Glucose reference range applies  only to samples taken after fasting for at least 8 hours.   BUN 13 6 - 20 mg/dL   Creatinine, Ser 5.36 0.61 - 1.24 mg/dL   Calcium 9.1 8.9 - 46.8 mg/dL   Total Protein 7.5 6.5 - 8.1 g/dL   Albumin 4.2 3.5 - 5.0 g/dL   AST 49 (H) 15 - 41 U/L   ALT 32 0 - 44 U/L   Alkaline Phosphatase 43 38 - 126 U/L   Total Bilirubin 0.4 0.3 - 1.2 mg/dL   GFR, Estimated >03 >21 mL/min    Comment: (NOTE) Calculated using the CKD-EPI Creatinine Equation (2021)    Anion gap 13 5 - 15    Comment: Performed at [facility], [address], [place], [place] [address]  Ethanol     Status: Abnormal   Collection Time: 03/30/20  7:57 PM  Result Value Ref Range   Alcohol, Ethyl (B) 63 (H) <10 mg/dL    Comment: (NOTE) Lowest detectable limit for serum alcohol is 10 mg/dL.  For medical purposes only. Performed at [facility], [address] [place], [place] [address]   Acetaminophen level     Status: Abnormal   Collection Time: 03/30/20  8:19 PM  Result Value Ref Range   Acetaminophen (Tylenol), Serum <10 (L) 10 - 30 ug/mL    Comment: (NOTE) Therapeutic concentrations vary significantly. A range of 10-30 ug/mL  may be an effective concentration for many patients. However, some  are best treated at concentrations outside of this range. Acetaminophen concentrations >150 ug/mL at 4 hours after ingestion  and >50 ug/mL at 12 hours after ingestion are often associated with  toxic reactions.  Performed at [facility], [address] [place], [place] [address]   Salicylate level     Status: Abnormal   Collection Time: 03/30/20  8:19 PM  Result Value Ref Range   Salicylate Lvl <7.0 (L) 7.0 - 30.0 mg/dL    Comment: Performed at [facility], [address]  [place], [place] [address]  Resp Panel by RT-PCR (Flu A&B, Covid) Nasopharyngeal Swab     Status: None   Collection Time: 03/30/20  8:21 PM   Specimen: Nasopharyngeal Swab; Nasopharyngeal(NP) swabs in vial  transport medium  Result Value Ref Range   SARS Coronavirus 2 by RT PCR NEGATIVE NEGATIVE    Comment: (NOTE) SARS-CoV-2 target nucleic acids are NOT DETECTED.  The SARS-CoV-2 RNA is generally detectable in upper respiratory specimens during the acute phase of infection. The lowest concentration of SARS-CoV-2 viral copies this assay can detect is 138 copies/mL. A negative result does not preclude SARS-Cov-2 infection and should not be used as the sole basis for treatment or other patient management decisions. A negative result may occur with  improper specimen collection/handling, submission of specimen other than nasopharyngeal swab, presence of viral mutation(s) within the areas targeted by this assay, and inadequate number of viral copies(<138 copies/mL). A negative result must be combined with clinical observations, patient history, and epidemiological information. The expected result is Negative.  Fact Sheet for Patients:  BloggerCourse.comhttps://www.fda.gov/media/152166/download  Fact Sheet for Healthcare Providers:  SeriousBroker.ithttps://www.fda.gov/media/152162/download  This test is no t yet approved or cleared by the [place] FDA and  has been authorized for detection and/or diagnosis of SARS-CoV-2 by FDA under an Emergency Use Authorization (EUA). This EUA will remain  in effect (meaning this test can be used) for the duration of the COVID-19 declaration under Section 564(b)(1) of the Act, 21 U.S.C.section 360bbb-3(b)(1), unless the authorization is terminated  or revoked sooner.       Influenza A by PCR NEGATIVE NEGATIVE   Influenza B by PCR NEGATIVE NEGATIVE    Comment: (NOTE) The Xpert Xpress SARS-CoV-2/FLU/RSV plus assay is intended as an aid in the diagnosis of influenza from Nasopharyngeal swab specimens and should not be used as a sole basis for treatment. Nasal washings and aspirates are unacceptable for Xpert Xpress SARS-CoV-2/FLU/RSV testing.  Fact Sheet for  Patients: BloggerCourse.comhttps://www.fda.gov/media/152166/download  Fact Sheet for Healthcare Providers: SeriousBroker.ithttps://www.fda.gov/media/152162/download  This test is not yet approved or cleared by the [place] FDA and has been authorized for detection and/or diagnosis of SARS-CoV-2 by FDA under an Emergency Use Authorization (EUA). This EUA will remain in effect (meaning this test can be used) for the duration of the COVID-19 declaration under Section 564(b)(1) of the Act, 21 U.S.C. section 360bbb-3(b)(1), unless the authorization is terminated or revoked.  Performed at [facility], [address] [place], [place] [address]   Urine Drug Screen, Qualitative [facility] only)     Status: Abnormal   Collection Time: 03/30/20  9:27 PM  Result Value Ref Range   Tricyclic, Ur Screen POSITIVE (A) NONE DETECTED   Amphetamines, Ur Screen NONE DETECTED NONE DETECTED   MDMA (Ecstasy)Ur Screen NONE DETECTED NONE DETECTED   Cocaine Metabolite,Ur [place] POSITIVE (A) NONE DETECTED   Opiate, Ur Screen NONE DETECTED NONE DETECTED   Phencyclidine (PCP) Ur S NONE DETECTED NONE DETECTED   Cannabinoid 50 Ng, Ur [place] POSITIVE (A) NONE DETECTED   Barbiturates, Ur Screen NONE DETECTED NONE DETECTED   Benzodiazepine, Ur Scrn POSITIVE (A) NONE DETECTED   Methadone Scn, Ur NONE DETECTED NONE DETECTED    Comment: (NOTE) Tricyclics + metabolites, urine    Cutoff 1000 ng/mL Amphetamines + metabolites, urine  Cutoff 1000 ng/mL MDMA (Ecstasy), urine              Cutoff 500 ng/mL Cocaine Metabolite, urine          Cutoff 300  ng/mL Opiate + metabolites, urine        Cutoff 300 ng/mL Phencyclidine (PCP), urine         Cutoff 25 ng/mL Cannabinoid, urine                 Cutoff 50 ng/mL Barbiturates + metabolites, urine  Cutoff 200 ng/mL Benzodiazepine, urine              Cutoff 200 ng/mL Methadone, urine                   Cutoff 300 ng/mL  The urine drug screen provides only a preliminary, unconfirmed analytical test result  and should not be used for non-medical purposes. Clinical consideration and professional judgment should be applied to any positive drug screen result due to possible interfering substances. A more specific alternate chemical method must be used in order to obtain a confirmed analytical result. Gas chromatography / mass spectrometry (GC/MS) is the preferred confirm atory method. Performed at [facility], [address] [place], [place] [address]   Urinalysis, Complete w Microscopic     Status: Abnormal   Collection Time: 03/30/20  9:27 PM  Result Value Ref Range   Color, Urine YELLOW (A) YELLOW   APPearance CLEAR (A) CLEAR   Specific Gravity, Urine 1.017 1.005 - 1.030   pH 6.0 5.0 - 8.0   Glucose, UA NEGATIVE NEGATIVE mg/dL   Hgb urine dipstick NEGATIVE NEGATIVE   Bilirubin Urine NEGATIVE NEGATIVE   Ketones, ur 5 (A) NEGATIVE mg/dL   Protein, ur NEGATIVE NEGATIVE mg/dL   Nitrite NEGATIVE NEGATIVE   Leukocytes,Ua NEGATIVE NEGATIVE   RBC / HPF 0-5 0 - 5 RBC/hpf   WBC, UA 0-5 0 - 5 WBC/hpf   Bacteria, UA RARE (A) NONE SEEN   Squamous Epithelial / LPF NONE SEEN 0 - 5   Mucus PRESENT    Sperm, UA PRESENT     Comment: Performed at [facility], [address] [place], [place] [address]    Blood Alcohol level:  Lab Results  Component Value Date   ETH 63 (H) 03/30/2020   ETH  06/15/2007    <5        LOWEST DETECTABLE LIMIT FOR SERUM ALCOHOL IS 11 mg/dL FOR MEDICAL PURPOSES ONLY    Metabolic Disorder Labs:  No results found for: HGBA1C, MPG No results found for: PROLACTIN Lab Results  Component Value Date   CHOL 158 03/15/2020   TRIG 170.0 (H) 03/15/2020   HDL 52.80 03/15/2020   CHOLHDL 3 03/15/2020   VLDL 34.0 03/15/2020   LDLCALC 71 03/15/2020   LDLCALC 67 10/09/2017    Current Medications: Current Facility-Administered Medications  Medication Dose Route Frequency Provider Last Rate Last Admin  . acetaminophen (TYLENOL) tablet 650 mg  650  mg Oral Q6H PRN Gillermo Murdoch, NP      . alum & mag hydroxide-simeth (MAALOX/MYLANTA) 200-200-20 MG/5ML suspension 30 mL  30 mL Oral Q4H PRN Gillermo Murdoch, NP      . loperamide (IMODIUM) capsule 2 mg  2 mg Oral Q8H PRN Jesse Sans, MD      . LORazepam (ATIVAN) injection 0-4 mg  0-4 mg Intravenous Q6H Gillermo Murdoch, NP       Or  . LORazepam (ATIVAN) tablet 0-4 mg  0-4 mg Oral Q6H Gillermo Murdoch, NP   2 mg at 03/31/20 1433  . [START ON 04/02/2020] LORazepam (ATIVAN) injection 0-4 mg  0-4 mg Intravenous Q12H Gillermo Murdoch, NP  Or  . Melene Muller ON 04/02/2020] LORazepam (ATIVAN) tablet 0-4 mg  0-4 mg Oral Q12H Gillermo Murdoch, NP      . magnesium hydroxide (MILK OF MAGNESIA) suspension 30 mL  30 mL Oral Daily PRN Gillermo Murdoch, NP      . melatonin tablet 2.5 mg  2.5 mg Oral QHS Gillermo Murdoch, NP      . nicotine (NICODERM CQ - dosed in mg/24 hours) patch 14 mg  14 mg Transdermal Daily Jesse Sans, MD   14 mg at 03/31/20 1226  . nicotine polacrilex (NICORETTE) gum 2 mg  2 mg Oral Q4H PRN Jesse Sans, MD   2 mg at 03/31/20 1226  . thiamine tablet 100 mg  100 mg Oral Daily Gillermo Murdoch, NP   100 mg at 03/31/20 6283   Or  . thiamine (B-1) injection 100 mg  100 mg Intravenous Daily Gillermo Murdoch, NP       PTA Medications: Medications Prior to Admission  Medication Sig Dispense Refill Last Dose  . clonazePAM (KLONOPIN) 0.5 MG tablet Take 1 tablet (0.5 mg total) by mouth 2 (two) times daily as needed for anxiety. And sleep APPT further refills. Call to schedule 60 tablet 5   . cyclobenzaprine (FLEXERIL) 10 MG tablet Take 1 tablet (10 mg total) by mouth 2 (two) times daily as needed for muscle spasms. 60 tablet 5   . ibuprofen (ADVIL) 200 MG tablet Take 200 mg by mouth every 6 (six) hours as needed.       Musculoskeletal: Strength & Muscle Tone: within normal limits Gait & Station: normal Patient leans: N/A  Psychiatric  Specialty Exam: Physical Exam Vitals and nursing note reviewed.  Constitutional:      Appearance: Normal appearance.  HENT:     Head: Normocephalic and atraumatic.     Right Ear: External ear normal.     Left Ear: External ear normal.     Nose: Nose normal.     Mouth/Throat:     Mouth: Mucous membranes are moist.     Pharynx: Oropharynx is clear.  Eyes:     Extraocular Movements: Extraocular movements intact.     Conjunctiva/sclera: Conjunctivae normal.     Pupils: Pupils are equal, round, and reactive to light.  Cardiovascular:     Rate and Rhythm: Normal rate.     Pulses: Normal pulses.  Pulmonary:     Effort: Pulmonary effort is normal.     Breath sounds: Normal breath sounds.  Abdominal:     General: Abdomen is flat.     Palpations: Abdomen is soft.  Musculoskeletal:        General: No swelling. Normal range of motion.     Cervical back: Normal range of motion and neck supple.  Skin:    General: Skin is warm and dry.  Neurological:     General: No focal deficit present.     Mental Status: He is alert and oriented to person, place, and time.  Psychiatric:        Attention and Perception: Attention and perception normal.        Mood and Affect: Mood is depressed. Affect is tearful.        Speech: Speech normal.        Behavior: Behavior is cooperative.        Thought Content: Thought content includes suicidal ideation. Thought content does not include suicidal plan.        Cognition and Memory: Cognition and memory normal.  Judgment: Judgment normal.     Review of Systems  Constitutional: Positive for appetite change and fatigue.  HENT: Negative for rhinorrhea and sore throat.   Eyes: Negative for photophobia and visual disturbance.  Respiratory: Negative for cough and shortness of breath.   Cardiovascular: Negative for chest pain and palpitations.  Gastrointestinal: Positive for diarrhea. Negative for nausea and vomiting.  Endocrine: Negative for cold  intolerance and heat intolerance.  Genitourinary: Negative for difficulty urinating and dysuria.  Musculoskeletal: Negative for arthralgias and myalgias.  Skin: Negative for rash and wound.  Allergic/Immunologic: Negative for food allergies.  Neurological: Negative for dizziness and headaches.  Hematological: Negative for adenopathy. Does not bruise/bleed easily.  Psychiatric/Behavioral: Positive for dysphoric mood, sleep disturbance and suicidal ideas.    Blood pressure 125/85, pulse 77, temperature 97.9 F (36.6 C), temperature source Oral, resp. rate 17, height 5\' 10"  (1.778 m), weight 68 kg, SpO2 100 %.Body mass index is 21.51 kg/m.  General Appearance: Fairly Groomed  Eye Contact:  Good  Speech:  Clear and Coherent and Normal Rate  Volume:  Normal  Mood:  Depressed and Dysphoric  Affect:  Congruent  Thought Process:  Coherent and Linear  Orientation:  Full (Time, Place, and Person)  Thought Content:  Logical  Suicidal Thoughts:  Yes.  without intent/plan  Homicidal Thoughts:  No  Memory:  Immediate;   Good Recent;   Good Remote;   Good  Judgement:  Intact  Insight:  Fair  Psychomotor Activity:  Normal  Concentration:  Concentration: Fair and Attention Span: Fair  Recall:  [employer] of Knowledge:  Fair  Language:  Fair  Akathisia:  Negative  Handed:  Right  AIMS (if indicated):     Assets:  Communication Skills Desire for Improvement Financial Resources/Insurance Housing Resilience Social Support Talents/Skills Transportation Vocational/Educational  ADL's:  Intact  Cognition:  WNL  Sleep:  Number of Hours: 4.5       Treatment Plan Summary: Daily contact with patient to assess and evaluate symptoms and progress in treatment and Medication management PLAN OF CARE: 45 year old male with history of MDD presenting with worsening depression in context of grief and substance abuse. At this time patient opting to not start medications for depression or anxiety at  this time. Providing grief counseling and supportive psychotherapy at this time. CIWA protocol for acute withdrawals.   Observation Level/Precautions:  Detox  Laboratory:  Completed in ED  Psychotherapy:    Medications:    Consultations:    Discharge Concerns:    Estimated LOS:  Other:     Physician Treatment Plan for Primary Diagnosis: Tobacco abuse Long Term Goal(s): Improvement in symptoms so as ready for discharge  Short Term Goals: Ability to identify changes in lifestyle to reduce recurrence of condition will improve, Ability to verbalize feelings will improve, Ability to disclose and discuss suicidal ideas, Ability to demonstrate self-control will improve, Ability to identify and develop effective coping behaviors will improve and Ability to identify triggers associated with substance abuse/mental health issues will improve  Physician Treatment Plan for Secondary Diagnosis: Principal Problem:   Tobacco abuse Active Problems:   MDD (major depressive disorder), recurrent episode, severe (HCC)  Long Term Goal(s): Improvement in symptoms so as ready for discharge  Short Term Goals: Ability to identify changes in lifestyle to reduce recurrence of condition will improve, Ability to verbalize feelings will improve, Ability to disclose and discuss suicidal ideas, Ability to demonstrate self-control will improve, Ability to identify and develop effective coping behaviors  will improve and Ability to identify triggers associated with substance abuse/mental health issues will improve  I certify that inpatient services furnished can reasonably be expected to improve the patient's condition.    Jesse Sans, MD 1/27/20223:50 PM

## 2020-03-31 NOTE — Plan of Care (Signed)
Patient new to the unit tonight, hasn't had time to progress  Problem: Education: Goal: Knowledge of [facility] Education information/materials will improve Outcome: Not Progressing Goal: Emotional status will improve Outcome: Not Progressing Goal: Mental status will improve Outcome: Not Progressing Goal: Verbalization of understanding the information provided will improve Outcome: Not Progressing   Problem: Safety: Goal: Periods of time without injury will increase Outcome: Not Progressing   Problem: Education: Goal: Ability to make informed decisions regarding treatment will improve Outcome: Not Progressing   Problem: Medication: Goal: Compliance with prescribed medication regimen will improve Outcome: Not Progressing   

## 2020-03-31 NOTE — [facility] Suicide Risk Assessment (Signed)
[facility] Admission Suicide Risk Assessment   Nursing information obtained from:  Patient Demographic factors:  Male,Divorced or widowed Current Mental Status:  Suicidal ideation indicated by patient Loss Factors:  Loss of significant relationship Historical Factors:  Impulsivity Risk Reduction Factors:  Positive coping skills or problem solving skills  Total Time spent with patient: 1 hour Principal Problem: Tobacco abuse Diagnosis:  Principal Problem:   Tobacco abuse Active Problems:   MDD (major depressive disorder), recurrent episode, severe (HCC)  Subjective Data: 45 year old male with history of MDD presenting with worsening depression in context of grief and substance abuse. Patient notes that he was previously in a relationship with his fiance, Mayer Camel, who began using hard substances and heroin. He tried to help her get clean several times, but ultimately asked her to leave the house due to persistent drug use. He states that at the time she said she needed him to help her, or she would kill herself. However, he stood firm and made her leave. She ultimately passed away via overdose two weeks later on Dec 21. He now blames himself for her death, and feels guilty for not doing more to save her. Since then he felt several symptoms of depression including hopelessness, helplessness, worthlessness, guilt, anhedonia, poor sleep, poor appetite, and recurrent suicidal thoughts. He notes he starting using up to 30 roxicodone per day to numb the pain over the lose. This started causing him trouble at work due to coming in high. He ultimately tried to taper off the roxi and started experiencing severe withdrawals, and confided in his parents that he needed help. He has a history of depression and has tried multiple SSRIs in the past and also wellbutrin which he found unhelpful. He also has abused Xanax and Klonopin in the past, and does not wish to resume any benzodiazepines. At this time, patient hoping to  stay in the hospital for acute detox from substances. He would also like to process through his depression and grief without medications. Approximately 40 minutes spent with patient providing grief counciling, and supportive psychotherapy. Also introduced some brief CBT techniques to delve into some unhelpful automatic thought patterns noted during our session. He was given homework assignment to 1) list his positive social supports, and 2) list times and actions taken to help his fiance during his relationship  Continued Clinical Symptoms:  Alcohol Use Disorder Identification Test Final Score (AUDIT): 5 The "Alcohol Use Disorders Identification Test", Guidelines for Use in Primary Care, Second Edition.  World Science writer [facility]). Score between 0-7:  no or low risk or alcohol related problems. Score between 8-15:  moderate risk of alcohol related problems. Score between 16-19:  high risk of alcohol related problems. Score 20 or above:  warrants further diagnostic evaluation for alcohol dependence and treatment.   CLINICAL FACTORS:   Severe Anxiety and/or Agitation Depression:   Anhedonia Comorbid alcohol abuse/dependence Hopelessness Insomnia Severe Alcohol/Substance Abuse/Dependencies Previous Psychiatric Diagnoses and Treatments   Musculoskeletal: Strength & Muscle Tone: within normal limits Gait & Station: normal Patient leans: N/A  Psychiatric Specialty Exam: Physical Exam Vitals and nursing note reviewed.  Constitutional:      Appearance: Normal appearance.  HENT:     Head: Normocephalic and atraumatic.     Right Ear: External ear normal.     Left Ear: External ear normal.     Nose: Nose normal.     Mouth/Throat:     Mouth: Mucous membranes are moist.     Pharynx: Oropharynx is clear.  Eyes:     Extraocular Movements: Extraocular movements intact.     Conjunctiva/sclera: Conjunctivae normal.     Pupils: Pupils are equal, round, and reactive to light.   Cardiovascular:     Rate and Rhythm: Normal rate.     Pulses: Normal pulses.  Pulmonary:     Effort: Pulmonary effort is normal.     Breath sounds: Normal breath sounds.  Abdominal:     General: Abdomen is flat.     Palpations: Abdomen is soft.  Musculoskeletal:        General: No swelling. Normal range of motion.     Cervical back: Normal range of motion and neck supple.  Skin:    General: Skin is warm and dry.  Neurological:     General: No focal deficit present.     Mental Status: He is alert and oriented to person, place, and time.  Psychiatric:        Attention and Perception: Attention and perception normal.        Mood and Affect: Mood is depressed. Affect is tearful.        Speech: Speech normal.        Behavior: Behavior is cooperative.        Thought Content: Thought content includes suicidal ideation. Thought content does not include suicidal plan.        Cognition and Memory: Cognition and memory normal.        Judgment: Judgment normal.     Review of Systems  Constitutional: Positive for appetite change and fatigue.  HENT: Negative for rhinorrhea and sore throat.   Eyes: Negative for photophobia and visual disturbance.  Respiratory: Negative for cough and shortness of breath.   Cardiovascular: Negative for chest pain and palpitations.  Gastrointestinal: Positive for diarrhea. Negative for nausea and vomiting.  Endocrine: Negative for cold intolerance and heat intolerance.  Genitourinary: Negative for difficulty urinating and dysuria.  Musculoskeletal: Negative for arthralgias and myalgias.  Skin: Negative for rash and wound.  Allergic/Immunologic: Negative for food allergies.  Neurological: Negative for dizziness and headaches.  Hematological: Negative for adenopathy. Does not bruise/bleed easily.  Psychiatric/Behavioral: Positive for dysphoric mood, sleep disturbance and suicidal ideas.    Blood pressure 125/85, pulse 77, temperature 97.9 F (36.6 C),  temperature source Oral, resp. rate 17, height 5\' 10"  (1.778 m), weight 68 kg, SpO2 100 %.Body mass index is 21.51 kg/m.  General Appearance: Fairly Groomed  Eye Contact:  Good  Speech:  Clear and Coherent and Normal Rate  Volume:  Normal  Mood:  Depressed and Dysphoric  Affect:  Congruent  Thought Process:  Coherent and Linear  Orientation:  Full (Time, Place, and Person)  Thought Content:  Logical  Suicidal Thoughts:  Yes.  without intent/plan  Homicidal Thoughts:  No  Memory:  Immediate;   Good Recent;   Good Remote;   Good  Judgement:  Intact  Insight:  Fair  Psychomotor Activity:  Normal  Concentration:  Concentration: Fair and Attention Span: Fair  Recall:  of Knowledge:  Fair  Language:  Fair  Akathisia:  Negative  Handed:  Right  AIMS (if indicated):     Assets:  Communication Skills Desire for Improvement Financial Resources/Insurance Housing Resilience Social Support Talents/Skills Transportation Vocational/Educational  ADL's:  Intact  Cognition:  WNL  Sleep:  Number of Hours: 4.5      COGNITIVE FEATURES THAT CONTRIBUTE TO RISK:  Polarized thinking    SUICIDE RISK:   Moderate:  Frequent  suicidal ideation with limited intensity, and duration, some specificity in terms of plans, no associated intent, good self-control, limited dysphoria/symptomatology, some risk factors present, and identifiable protective factors, including available and accessible social support.  PLAN OF CARE: 45 year old male with history of MDD presenting with worsening depression in context of grief and substance abuse. At this time patient opting to not start medications for depression or anxiety at this time. Providing grief counseling and supportive psychotherapy at this time. CIWA protocol for acute withdrawals.   I certify that inpatient services furnished can reasonably be expected to improve the patient's condition.   Jesse Sans, MD 03/31/2020, 3:37 PM

## 2020-03-31 NOTE — Progress Notes (Signed)
Patient admitted from [facility] - ED, report received from [facility]. Patient calm and cooperative during assessment. Patient endorses passive SI, anxiety and depression. Patient denies HI/AVH. Patient skin check completed, patient has several tattoos bilateral legs, arms, back, neck and chest. Patient has track marks in L antecubital. Patient oriented to the unit and his room. Patient given education, support, and encouragement to be active in his treatment plan. Patient being monitored Q 15 minutes for safety per unit protocol. Pt remains safe on the unit.

## 2020-03-31 NOTE — Plan of Care (Addendum)
Pt rates anxiety 8/10 and depression 10/10. Pt denies SI, HI and AVH. Pt was educated on care plan and verbalizes understanding. Torrie Mayers RN Problem: Education: Goal: Knowledge of [facility] Education information/materials will improve Outcome: Progressing Goal: Emotional status will improve Outcome: Progressing Goal: Mental status will improve Outcome: Progressing Goal: Verbalization of understanding the information provided will improve Outcome: Progressing   Problem: Safety: Goal: Periods of time without injury will increase Outcome: Progressing   Problem: Education: Goal: Ability to make informed decisions regarding treatment will improve Outcome: Progressing   Problem: Medication: Goal: Compliance with prescribed medication regimen will improve Outcome: Progressing

## 2020-03-31 NOTE — Progress Notes (Signed)
Recreation Therapy Notes   Date: 03/31/2020   Time: 9:30 am   Location: Craft room     Behavioral response: N/A   Intervention Topic: Decision Making    Discussion/Intervention: Patient did not attend group.   Clinical Observations/Feedback:  Patient did not attend group.   Yusif Gnau LRT/CTRS        Treacy Holcomb 03/31/2020 11:13 AM 

## 2020-03-31 NOTE — [facility] Counselor (Signed)
CSW provided patient with printed out providers that accept patient's needs and insurance.  Penni Homans, MSW, LCSW 03/31/2020 3:50 PM

## 2020-03-31 NOTE — Progress Notes (Signed)
Pt was given 2 mg of Ativan today for a CIWA of 11. MD notfied and director of [facility]. Torrie Mayers RN

## 2020-03-31 NOTE — Progress Notes (Signed)
Patient is pleasant and cooperative. Denies any SI, HI, AVH. CIWA =11, 2 mg ativan given. Pt request sleep meds, reports melatonin does not work alone. Trazodone ordered and provided to patient. Pt noted in dayroom with peers watching tv. No distress. Encouragement and support provided. Safety checks maintained. Medications given as prescribed. Pt receptive and remains safe on unit with q 15 min checks.

## 2020-03-31 NOTE — [facility] Suicide Risk Assessment (Signed)
[facility] INPATIENT:  Family/Significant Other Suicide Prevention Education  Suicide Prevention Education:  Patient Refusal for Family/Significant Other Suicide Prevention Education: The patient Parker Wilson. has refused to provide written consent for family/significant other to be provided Family/Significant Other Suicide Prevention Education during admission and/or prior to discharge.  Physician notified.  SPE completed with pt, as pt refused to consent to family contact. SPI pamphlet provided to pt and pt was encouraged to share information with support network, ask questions, and talk about any concerns relating to SPE. Pt denies access to guns/firearms and verbalized understanding of information provided. Mobile Crisis information also provided to pt.    Harden Mo 03/31/2020, 3:22 PM

## 2020-03-31 NOTE — [facility] Group Notes (Signed)
LCSW Group Therapy Note  03/31/2020 2:02 PM  Type of Therapy/Topic:  Group Therapy:  Balance in Life  Participation Level:  Did Not Attend  Description of Group:    This group will address the concept of balance and how it feels and looks when one is unbalanced. Patients will be encouraged to process areas in their lives that are out of balance and identify reasons for remaining unbalanced. Facilitators will guide patients in utilizing problem-solving interventions to address and correct the stressor making their life unbalanced. Understanding and applying boundaries will be explored and addressed for obtaining and maintaining a balanced life. Patients will be encouraged to explore ways to assertively make their unbalanced needs known to significant others in their lives, using other group members and facilitator for support and feedback.  Therapeutic Goals: 1. Patient will identify two or more emotions or situations they have that consume much of in their lives. 2. Patient will identify signs/triggers that life has become out of balance:  3. Patient will identify two ways to set boundaries in order to achieve balance in their lives:  4. Patient will demonstrate ability to communicate their needs through discussion and/or role plays  Summary of Patient Progress: X  Therapeutic Modalities:   Cognitive Behavioral Therapy Solution-Focused Therapy Assertiveness Training  Penni Homans MSW, LCSW 03/31/2020 2:02 PM

## 2020-03-31 NOTE — Tx Team (Signed)
Initial Treatment Plan 03/31/2020 12:16 AM [name] EHU:[id number]    PATIENT STRESSORS: Marital or family conflict Substance abuse   PATIENT STRENGTHS: Average or above average intelligence Motivation for treatment/growth   PATIENT IDENTIFIED PROBLEMS: Suicidal Ideation  Depression  Substance Abuse                 DISCHARGE CRITERIA:  Motivation to continue treatment in a less acute level of care Verbal commitment to aftercare and medication compliance  PRELIMINARY DISCHARGE PLAN: Outpatient therapy Return to previous living arrangement  PATIENT/FAMILY INVOLVEMENT: This treatment plan has been presented to and reviewed with the patient, Parker Wilson. The patient has been given the opportunity to ask questions and make suggestions.  Elmyra Ricks, RN 03/31/2020, 12:16 AM

## 2020-03-31 NOTE — Plan of Care (Signed)
  Problem: Safety: Goal: Periods of time without injury will increase Outcome: Progressing   Problem: Education: Goal: Ability to make informed decisions regarding treatment will improve Outcome: Progressing   Problem: Medication: Goal: Compliance with prescribed medication regimen will improve Outcome: Progressing

## 2020-04-01 DIAGNOSIS — Z720 Tobacco use: Secondary | ICD-10-CM | POA: Diagnosis not present

## 2020-04-01 NOTE — Progress Notes (Signed)
Recreation Therapy Notes  INPATIENT RECREATION TR PLAN  Patient Details Name: Parker Wilson. MRN: [id number] DOB: 10-21-1975 Today's Date: 04/01/2020  Rec Therapy Plan Is patient appropriate for Therapeutic Recreation?: Yes Treatment times per week: at least 3 Estimated Length of Stay: 5-7 days TR Treatment/Interventions: Group participation (Comment)  Discharge Criteria Pt will be discharged from therapy if:: Discharged Treatment plan/goals/alternatives discussed and agreed upon by:: Patient/family  Discharge Summary     Chelse Matas 04/01/2020, 12:41 PM

## 2020-04-01 NOTE — [facility] Group Notes (Signed)
LCSW Group Therapy Note  04/01/2020 2:12 PM  Type of Therapy and Topic:  Group Therapy:  Feelings around Relapse and Recovery  Participation Level:  Active   Description of Group:    Patients in this group will discuss emotions they experience before and after a relapse. They will process how experiencing these feelings, or avoidance of experiencing them, relates to having a relapse. Facilitator will guide patients to explore emotions they have related to recovery. Patients will be encouraged to process which emotions are more powerful. They will be guided to discuss the emotional reaction significant others in their lives may have to their relapse or recovery. Patients will be assisted in exploring ways to respond to the emotions of others without this contributing to a relapse.  Therapeutic Goals: 1. Patient will identify two or more emotions that lead to a relapse for them 2. Patient will identify two emotions that result when they relapse 3. Patient will identify two emotions related to recovery 4. Patient will demonstrate ability to communicate their needs through discussion and/or role plays   Summary of Patient Progress: Patient was present for the entirety of the group session. Patient was an active listener and participated in the topic of discussion. Patient added nuance and shared experiences with relapse and recovery. Patient reported that his recent relapse was caused by a recent loss of a loved one. Patient was motivated and forthcoming in group.    Therapeutic Modalities:   Cognitive Behavioral Therapy Solution-Focused Therapy Assertiveness Training Relapse Prevention Therapy   Gwenevere Ghazi, MSW, [place], [place] 04/01/2020 2:12 PM

## 2020-04-01 NOTE — Tx Team (Addendum)
Interdisciplinary Treatment and Diagnostic Plan Update  04/01/2020 Time of Session: 9:00AM Parker Wilson. MRN: [id number]  Principal Diagnosis: Tobacco abuse  Secondary Diagnoses: Principal Problem:   Tobacco abuse Active Problems:   MDD (major depressive disorder), recurrent episode, severe (HCC)   Current Medications:  Current Facility-Administered Medications  Medication Dose Route Frequency Provider Last Rate Last Admin  . acetaminophen (TYLENOL) tablet 650 mg  650 mg Oral Q6H PRN Caroline Sauger, NP   650 mg at 04/01/20 0805  . alum & mag hydroxide-simeth (MAALOX/MYLANTA) 200-200-20 MG/5ML suspension 30 mL  30 mL Oral Q4H PRN Caroline Sauger, NP      . loperamide (IMODIUM) capsule 2 mg  2 mg Oral Q8H PRN Salley Scarlet, MD      . LORazepam (ATIVAN) injection 0-4 mg  0-4 mg Intravenous Q6H Caroline Sauger, NP       Or  . LORazepam (ATIVAN) tablet 0-4 mg  0-4 mg Oral Q6H Caroline Sauger, NP   2 mg at 04/01/20 0805  . [START ON 04/02/2020] LORazepam (ATIVAN) injection 0-4 mg  0-4 mg Intravenous Q12H Caroline Sauger, NP       Or  . Derrill Memo ON 04/02/2020] LORazepam (ATIVAN) tablet 0-4 mg  0-4 mg Oral Q12H Caroline Sauger, NP      . magnesium hydroxide (MILK OF MAGNESIA) suspension 30 mL  30 mL Oral Daily PRN Caroline Sauger, NP      . melatonin tablet 2.5 mg  2.5 mg Oral QHS Caroline Sauger, NP   2.5 mg at 03/31/20 2104  . nicotine (NICODERM CQ - dosed in mg/24 hours) patch 14 mg  14 mg Transdermal Daily Salley Scarlet, MD   14 mg at 04/01/20 0806  . nicotine polacrilex (NICORETTE) gum 2 mg  2 mg Oral Q4H PRN Salley Scarlet, MD   2 mg at 04/01/20 0805  . thiamine tablet 100 mg  100 mg Oral Daily Caroline Sauger, NP   100 mg at 04/01/20 2993   Or  . thiamine (B-1) injection 100 mg  100 mg Intravenous Daily Caroline Sauger, NP      . traZODone (DESYREL) tablet 100 mg  100 mg Oral QHS Caroline Sauger, NP   100 mg at 03/31/20  2121   PTA Medications: Medications Prior to Admission  Medication Sig Dispense Refill Last Dose  . clonazePAM (KLONOPIN) 0.5 MG tablet Take 1 tablet (0.5 mg total) by mouth 2 (two) times daily as needed for anxiety. And sleep APPT further refills. Call to schedule 60 tablet 5   . cyclobenzaprine (FLEXERIL) 10 MG tablet Take 1 tablet (10 mg total) by mouth 2 (two) times daily as needed for muscle spasms. 60 tablet 5   . ibuprofen (ADVIL) 200 MG tablet Take 200 mg by mouth every 6 (six) hours as needed.       Patient Stressors: Marital or family conflict Substance abuse  Patient Strengths: Average or above average intelligence Motivation for treatment/growth  Treatment Modalities: Medication Management, Group therapy, Case management,  1 to 1 session with clinician, Psychoeducation, Recreational therapy.   Physician Treatment Plan for Primary Diagnosis: Tobacco abuse Long Term Goal(s): Improvement in symptoms so as ready for discharge Improvement in symptoms so as ready for discharge   Short Term Goals: Ability to identify changes in lifestyle to reduce recurrence of condition will improve Ability to verbalize feelings will improve Ability to disclose and discuss suicidal ideas Ability to demonstrate self-control will improve Ability to identify and develop effective coping behaviors  will improve Ability to identify triggers associated with substance abuse/mental health issues will improve Ability to identify changes in lifestyle to reduce recurrence of condition will improve Ability to verbalize feelings will improve Ability to disclose and discuss suicidal ideas Ability to demonstrate self-control will improve Ability to identify and develop effective coping behaviors will improve Ability to identify triggers associated with substance abuse/mental health issues will improve  Medication Management: Evaluate patient's response, side effects, and tolerance of medication  regimen.  Therapeutic Interventions: 1 to 1 sessions, Unit Group sessions and Medication administration.  Evaluation of Outcomes: Not Met  Physician Treatment Plan for Secondary Diagnosis: Principal Problem:   Tobacco abuse Active Problems:   MDD (major depressive disorder), recurrent episode, severe ([place])  Long Term Goal(s): Improvement in symptoms so as ready for discharge Improvement in symptoms so as ready for discharge   Short Term Goals: Ability to identify changes in lifestyle to reduce recurrence of condition will improve Ability to verbalize feelings will improve Ability to disclose and discuss suicidal ideas Ability to demonstrate self-control will improve Ability to identify and develop effective coping behaviors will improve Ability to identify triggers associated with substance abuse/mental health issues will improve Ability to identify changes in lifestyle to reduce recurrence of condition will improve Ability to verbalize feelings will improve Ability to disclose and discuss suicidal ideas Ability to demonstrate self-control will improve Ability to identify and develop effective coping behaviors will improve Ability to identify triggers associated with substance abuse/mental health issues will improve     Medication Management: Evaluate patient's response, side effects, and tolerance of medication regimen.  Therapeutic Interventions: 1 to 1 sessions, Unit Group sessions and Medication administration.  Evaluation of Outcomes: Not Met   RN Treatment Plan for Primary Diagnosis: Tobacco abuse Long Term Goal(s): Knowledge of disease and therapeutic regimen to maintain health will improve  Short Term Goals: Ability to demonstrate self-control, Ability to participate in decision making will improve, Ability to verbalize feelings will improve, Ability to disclose and discuss suicidal ideas, Ability to identify and develop effective coping behaviors will improve and  Compliance with prescribed medications will improve  Medication Management: RN will administer medications as ordered by provider, will assess and evaluate patient's response and provide education to patient for prescribed medication. RN will report any adverse and/or side effects to prescribing provider.  Therapeutic Interventions: 1 on 1 counseling sessions, Psychoeducation, Medication administration, Evaluate responses to treatment, Monitor vital signs and CBGs as ordered, Perform/monitor CIWA, COWS, AIMS and Fall Risk screenings as ordered, Perform wound care treatments as ordered.  Evaluation of Outcomes: Not Met   LCSW Treatment Plan for Primary Diagnosis: Tobacco abuse Long Term Goal(s): Safe transition to appropriate next level of care at discharge, Engage patient in therapeutic group addressing interpersonal concerns.  Short Term Goals: Engage patient in aftercare planning with referrals and resources, Increase social support, Increase ability to appropriately verbalize feelings, Increase emotional regulation, Facilitate acceptance of mental health diagnosis and concerns, Facilitate patient progression through stages of change regarding substance use diagnoses and concerns and Identify triggers associated with mental health/substance abuse issues  Therapeutic Interventions: Assess for all discharge needs, 1 to 1 time with Social worker, Explore available resources and support systems, Assess for adequacy in community support network, Educate family and significant other(s) on suicide prevention, Complete Psychosocial Assessment, Interpersonal group therapy.  Evaluation of Outcomes: Not Met   Progress in Treatment: Attending groups: No. Participating in groups: No. Taking medication as prescribed: Yes. Toleration medication: Yes. Family/Significant  other contact made: Yes, individual(s) contacted:  SPE completed with the patient.  Patient declined collateral contact.  Patient  understands diagnosis: Yes. Discussing patient identified problems/goals with staff: Yes. Medical problems stabilized or resolved: Yes. Denies suicidal/homicidal ideation: Yes. Issues/concerns per patient self-inventory: No. Other: none  New problem(s) identified: No, Describe:  none  New Short Term/Long Term Goal(s):  detox, elimination of symptoms of psychosis, medication management for mood stabilization; elimination of SI thoughts; development of comprehensive mental wellness/sobriety plan.  Patient Goals:  "I want to get off these pain pills"  Discharge Plan or Barriers: Patient has been provided a list of local providers that meet his insurance and copay requirements.  Patient has indicated that he wishes to begin outpatient treatment to meet his needs.  Reason for Continuation of Hospitalization: Anxiety Depression Homicidal ideation Medication stabilization Suicidal ideation Withdrawal symptoms  Estimated Length of Stay:  1-7 days  Recreational Therapy: Patient Stressors: N/A Patient Goal: Patient will engage in groups without prompting or encouragement from LRT x3 group sessions within 5 recreation therapy group sessions.  Attendees: Patient: Parker Wilson. 04/01/2020 10:01 AM  Physician: Dr. Domingo Cocking, MD 04/01/2020 10:01 AM  Nursing: Steele Sizer, RN 04/01/2020 10:01 AM  RN Care Manager:  04/01/2020 10:01 AM  Social Worker: Assunta Curtis, MSW, LCSW 04/01/2020 10:01 AM  Recreational Therapist: Roanna Epley, Reather Converse, LRT 04/01/2020 10:01 AM  Other: Michell Heinrich, LCSW 04/01/2020 10:01 AM  Other: Paulla Dolly, MSW, Richrd Sox 04/01/2020 10:01 AM  Other: 04/01/2020 10:01 AM    Scribe for Treatment Team: Rozann Lesches, LCSW 04/01/2020 10:01 AM

## 2020-04-01 NOTE — Plan of Care (Signed)
Patient is showing improvement and has now lesser withdrawal symptoms. Has been active in the milieu, attending groups with active participation. Denying thoughts of self harm and pleasant on approach. Appetite and sleep improved. Reported that his goal is to work on stopping pain medications and become  more active in therapy.  He is taking his medications as prescribed and showing motivation for treatment. Pt is encouraged to express thoughts and feelings as needed. Safety monitored as expected.

## 2020-04-01 NOTE — Progress Notes (Signed)
Recreation Therapy Notes  INPATIENT RECREATION THERAPY ASSESSMENT  Patient Details Name: Parker Wilson. MRN: [id number] DOB: Jul 25, 1975 Today's Date: 04/01/2020       Information Obtained From: Patient  Able to Participate in Assessment/Interview: Yes  Patient Presentation: Responsive  Reason for Admission (Per Patient): Active Symptoms,Substance Abuse  Patient Stressors: Death,Work  Coping Skills:   Data processing manager  Leisure Interests (2+):  Nature - Hiking,Nature - Camping,Community - Travel (Comment),Nature - Hunting,Nature - Ambulance person of Recreation/Participation: Chief Executive Officer of Community Resources:  Yes  Community Resources:  Park  Current Use:    If no, Barriers?:    Expressed Interest in [employer] Information:    [place] of Residence:  Film/video editor  Patient Main Form of Transportation: Walk  Patient Strengths:  Good heart, Tough  Patient Identified Areas of Improvement:  Coping  Patient Goal for Hospitalization:  Detox  Current SI (including self-harm):  No  Current HI:  No  Current AVH: No  Staff Intervention Plan: Group Attendance,Collaborate with Interdisciplinary Treatment Team  Consent to Intern Participation: N/A  Parker Wilson 04/01/2020, 12:40 PM

## 2020-04-01 NOTE — Progress Notes (Signed)
Recreation Therapy Notes   Date: 04/01/2020  Time: 9:30 am   Location: Craft room   Behavioral response: Appropriate  Intervention Topic: Time Management   Discussion/Intervention:  Group content today was focused on time management. The group defined time management and identified healthy ways to manage time. Individuals expressed how much of the 24 hours they use in a day. Patients expressed how much time they use just for themselves personally. The group expressed how they have managed their time in the past. Individuals participated in the intervention "Managing Life" where they had a chance to see how much of the 24 hours they use and where it goes. Clinical Observations/Feedback: Patient came to group and explained that he manages his time by prioritizing things. He explained that time management is important to get things. Participant expressed that he would rate his time management skills a 2/10. Individual was social with staff while participating in the intervention. Dade Rodin LRT/CTRS         Alexus Michael 04/01/2020 11:07 AM

## 2020-04-01 NOTE — Progress Notes (Signed)
[facility] MD Progress Note  04/01/2020 4:16 PM Parker Wilson.  MRN:  [id number] Subjective:  45 year old male with history of MDD presenting with worsening depression in context of grief and substance abuse. Patient had no acute events overnight, attending to ADLs, no schedule medications.   Patient seen during treatment team and again one-on-one at bedside. He continues to complete acute detox without major complications. CIWA 5 this morning, and exhibiting only minor diarrhea and nausea from opiate withdrawals. His goal is to complete inpatient detox from opiates and benzodiazepines, and learn skills to cope with trauma. He also wants to pursue outpatient therapy. Patient did complete the two therapy assignments we discussed yesterday to 1) list his positive social supports, and 2) list times and actions taken to help his fiance during his relationship. Approximately 35 minutes spent processing through his responses to the questions. He did admit feeling anger over the second assignment, but ultimately he realized that he should not blame himself for his fiance's death. Emphasis placed on acceptance of various types of emotions including anger, saddness, and grief. Discussed techniques for allowing self to sit in these emotions, and process bodily feelings that are taking place. Discussed various healthy coping mechanisms to help assist with having an outlet for these emotions. He brings up using a punching bag or doing kick boxing as a healthy coping mechanism for releasing some anger. He found the homework assignments beneficial along with todays discussion. He continues to feel that continuing outpatient therapy will be very useful to him.  Principal Problem: Tobacco abuse Diagnosis: Principal Problem:   Tobacco abuse Active Problems:   MDD (major depressive disorder), recurrent episode, severe (HCC)  Total Time spent with patient: 45 minutes  Past Psychiatric History: History of MDD, GAD, and  insomnia. 1 prior inpatient admission for two weeks to acutely detox from Xanax. No history of suicide attempts  Past Medical History:  Past Medical History:  Diagnosis Date  . Anxiety   . Chronic back pain   . Chronic headaches   . Depression   . Insomnia   . Scoliosis   . Vitamin D deficiency     Past Surgical History:  Procedure Laterality Date  . tubes in ears     age 74 or 45 y.o    Family History:  Family History  Problem Relation Age of Onset  . Heart disease Father        cabg x 3   . Hypertension Father   . Arthritis Father   . Depression Father   . Hyperlipidemia Father   . Crohn's disease Father   . Hypertension Brother   . Alcohol abuse Brother   . Depression Brother   . Hyperlipidemia Brother   . Cancer Paternal Aunt        breast cancer  . Cancer Paternal Grandfather        Lung cancer  . Alcohol abuse Paternal Grandfather   . Arthritis Paternal Grandfather   . COPD Paternal Grandfather   . Early death Paternal Grandfather   . Heart disease Paternal Grandfather        cabg x 4   . Hyperlipidemia Paternal Grandfather   . Hypertension Paternal Grandfather   . Other Paternal Grandfather        died covid 2019-06-15  . Arthritis Mother        RA  . Arthritis Paternal Grandmother   . Depression Paternal Grandmother   . Seizures Paternal Grandmother    Family Psychiatric  History: Father, brother, PGM with depression. Brother with alcohol use disorder Social History:  Social History   Substance and Sexual Activity  Alcohol Use Yes  . Alcohol/week: 6.0 standard drinks  . Types: 6 Cans of beer per week     Social History   Substance and Sexual Activity  Drug Use Yes  . Types: Marijuana   Comment: Occasional use approximately every 2-3 weeks    Social History   Socioeconomic History  . Marital status: Divorced    Spouse name: Not on file  . Number of children: Not on file  . Years of education: Not on file  . Highest education level: Not on  file  Occupational History  . Not on file  Tobacco Use  . Smoking status: Current Every Day Smoker    Packs/day: 1.50    Years: 15.00    Pack years: 22.50    Types: Cigarettes  . Smokeless tobacco: Never Used  . Tobacco comment: x 12-13 years as of 10/08/17   Substance and Sexual Activity  . Alcohol use: Yes    Alcohol/week: 6.0 standard drinks    Types: 6 Cans of beer per week  . Drug use: Yes    Types: Marijuana    Comment: Occasional use approximately every 2-3 weeks  . Sexual activity: Yes  Other Topics Concern  . Not on file  Social History Narrative   No guns    Wears seat belt    Single   As of 03/15/20 drinking 2-4 beers daily and 1 ppd cigs   Social Determinants of Health   Financial Resource Strain: Not on file  Food Insecurity: Not on file  Transportation Needs: Not on file  Physical Activity: Not on file  Stress: Not on file  Social Connections: Not on file   Additional Social History:                         Sleep: Poor  Appetite:  Fair  Current Medications: Current Facility-Administered Medications  Medication Dose Route Frequency Provider Last Rate Last Admin  . acetaminophen (TYLENOL) tablet 650 mg  650 mg Oral Q6H PRN Gillermo Murdoch, NP   650 mg at 04/01/20 0805  . alum & mag hydroxide-simeth (MAALOX/MYLANTA) 200-200-20 MG/5ML suspension 30 mL  30 mL Oral Q4H PRN Gillermo Murdoch, NP      . loperamide (IMODIUM) capsule 2 mg  2 mg Oral Q8H PRN Jesse Sans, MD      . LORazepam (ATIVAN) injection 0-4 mg  0-4 mg Intravenous Q6H Gillermo Murdoch, NP       Or  . LORazepam (ATIVAN) tablet 0-4 mg  0-4 mg Oral Q6H Gillermo Murdoch, NP   2 mg at 04/01/20 1309  . [START ON 04/02/2020] LORazepam (ATIVAN) injection 0-4 mg  0-4 mg Intravenous Q12H Gillermo Murdoch, NP       Or  . Melene Muller ON 04/02/2020] LORazepam (ATIVAN) tablet 0-4 mg  0-4 mg Oral Q12H Gillermo Murdoch, NP      . magnesium hydroxide (MILK OF MAGNESIA)  suspension 30 mL  30 mL Oral Daily PRN Gillermo Murdoch, NP      . melatonin tablet 2.5 mg  2.5 mg Oral QHS Gillermo Murdoch, NP   2.5 mg at 03/31/20 2104  . nicotine (NICODERM CQ - dosed in mg/24 hours) patch 14 mg  14 mg Transdermal Daily Jesse Sans, MD   14 mg at 04/01/20 0806  . nicotine polacrilex (NICORETTE) gum  2 mg  2 mg Oral Q4H PRN Jesse Sans, MD   2 mg at 04/01/20 0805  . thiamine tablet 100 mg  100 mg Oral Daily Gillermo Murdoch, NP   100 mg at 04/01/20 1157   Or  . thiamine (B-1) injection 100 mg  100 mg Intravenous Daily Gillermo Murdoch, NP      . traZODone (DESYREL) tablet 100 mg  100 mg Oral QHS Gillermo Murdoch, NP   100 mg at 03/31/20 2121    Lab Results:  Results for orders placed or performed during the hospital encounter of 03/30/20 (from the past 48 hour(s))  Urine Drug Screen, Qualitative ([facility] only)     Status: Abnormal   Collection Time: 03/30/20  9:27 PM  Result Value Ref Range   Tricyclic, Ur Screen POSITIVE (A) NONE DETECTED   Amphetamines, Ur Screen NONE DETECTED NONE DETECTED   MDMA (Ecstasy)Ur Screen NONE DETECTED NONE DETECTED   Cocaine Metabolite,Ur [place] POSITIVE (A) NONE DETECTED   Opiate, Ur Screen NONE DETECTED NONE DETECTED   Phencyclidine (PCP) Ur S NONE DETECTED NONE DETECTED   Cannabinoid 50 Ng, Ur [place] POSITIVE (A) NONE DETECTED   Barbiturates, Ur Screen NONE DETECTED NONE DETECTED   Benzodiazepine, Ur Scrn POSITIVE (A) NONE DETECTED   Methadone Scn, Ur NONE DETECTED NONE DETECTED    Comment: (NOTE) Tricyclics + metabolites, urine    Cutoff 1000 ng/mL Amphetamines + metabolites, urine  Cutoff 1000 ng/mL MDMA (Ecstasy), urine              Cutoff 500 ng/mL Cocaine Metabolite, urine          Cutoff 300 ng/mL Opiate + metabolites, urine        Cutoff 300 ng/mL Phencyclidine (PCP), urine         Cutoff 25 ng/mL Cannabinoid, urine                 Cutoff 50 ng/mL Barbiturates + metabolites, urine  Cutoff 200  ng/mL Benzodiazepine, urine              Cutoff 200 ng/mL Methadone, urine                   Cutoff 300 ng/mL  The urine drug screen provides only a preliminary, unconfirmed analytical test result and should not be used for non-medical purposes. Clinical consideration and professional judgment should be applied to any positive drug screen result due to possible interfering substances. A more specific alternate chemical method must be used in order to obtain a confirmed analytical result. Gas chromatography / mass spectrometry (GC/MS) is the preferred confirm atory method. Performed at [facility], [address] [place], [place] [address]     Blood Alcohol level:  Lab Results  Component Value Date   ETH 63 (H) 03/30/2020   [facility]  06/15/2007    <5        LOWEST DETECTABLE LIMIT FOR SERUM ALCOHOL IS 11 mg/dL FOR MEDICAL PURPOSES ONLY    Metabolic Disorder Labs: No results found for: HGBA1C, MPG No results found for: PROLACTIN Lab Results  Component Value Date   CHOL 158 03/15/2020   TRIG 170.0 (H) 03/15/2020   HDL 52.80 03/15/2020   CHOLHDL 3 03/15/2020   VLDL 34.0 03/15/2020   LDLCALC 71 03/15/2020   LDLCALC 67 10/09/2017    Physical Findings: AIMS:  , ,  ,  ,    CIWA:  CIWA-Ar Total: 7 COWS:     Musculoskeletal: Strength &  Muscle Tone: within normal limits Gait & Station: normal Patient leans: N/A  Psychiatric Specialty Exam: Physical Exam Vitals and nursing note reviewed.  Constitutional:      Appearance: Normal appearance.  HENT:     Head: Normocephalic and atraumatic.     Right Ear: External ear normal.     Left Ear: External ear normal.     Nose: Nose normal.     Mouth/Throat:     Mouth: Mucous membranes are moist.     Pharynx: Oropharynx is clear.  Eyes:     Extraocular Movements: Extraocular movements intact.     Conjunctiva/sclera: Conjunctivae normal.     Pupils: Pupils are equal, round, and reactive to light.  Cardiovascular:      Rate and Rhythm: Normal rate.     Pulses: Normal pulses.  Pulmonary:     Effort: Pulmonary effort is normal.     Breath sounds: Normal breath sounds.  Abdominal:     General: Abdomen is flat.     Palpations: Abdomen is soft.  Musculoskeletal:        General: No swelling. Normal range of motion.     Cervical back: Normal range of motion and neck supple.  Skin:    General: Skin is warm and dry.  Neurological:     General: No focal deficit present.     Mental Status: He is alert and oriented to person, place, and time.  Psychiatric:        Attention and Perception: Attention and perception normal.        Mood and Affect: Mood is depressed. Affect is tearful.        Speech: Speech normal.        Behavior: Behavior is cooperative.        Thought Content: Thought content does not include suicidal ideation.        Cognition and Memory: Cognition and memory normal.        Judgment: Judgment normal.     Review of Systems  Constitutional: Negative for diaphoresis and fatigue.  HENT: Negative for rhinorrhea and sore throat.   Eyes: Negative for photophobia and visual disturbance.  Respiratory: Negative for cough and shortness of breath.   Cardiovascular: Negative for chest pain and palpitations.  Gastrointestinal: Positive for diarrhea and nausea. Negative for constipation and vomiting.  Endocrine: Negative for cold intolerance and heat intolerance.  Genitourinary: Negative for difficulty urinating and dysuria.  Musculoskeletal: Negative for arthralgias and myalgias.  Skin: Negative for rash and wound.  Allergic/Immunologic: Negative for food allergies and immunocompromised state.  Neurological: Negative for dizziness and headaches.  Hematological: Negative for adenopathy. Does not bruise/bleed easily.  Psychiatric/Behavioral: Positive for dysphoric mood and sleep disturbance. Negative for suicidal ideas. The patient is nervous/anxious.     Blood pressure 130/88, pulse 79, temperature  98.3 F (36.8 C), temperature source Oral, resp. rate 17, height 5\' 10"  (1.778 m), weight 68 kg, SpO2 100 %.Body mass index is 21.51 kg/m.  General Appearance: Well Groomed  Eye Contact:  Good  Speech:  Clear and Coherent and Normal Rate  Volume:  Normal  Mood:  Depressed and Dysphoric  Affect:  Congruent  Thought Process:  Coherent and Linear  Orientation:  Full (Time, Place, and Person)  Thought Content:  Logical  Suicidal Thoughts:  No  Homicidal Thoughts:  No  Memory:  Immediate;   Fair Recent;   Fair Remote;   Fair  Judgement:  Intact  Insight:  Present  Psychomotor Activity:  Normal  Concentration:  Concentration: Fair  Recall:  [employer]  Fund of Knowledge:  Fair  Language:  Fair  Akathisia:  Negative  Handed:  Right  AIMS (if indicated):     Assets:  Communication Skills Desire for Improvement Financial Resources/Insurance Housing Leisure Time Physical Health Resilience Social Support Talents/Skills Transportation Vocational/Educational  ADL's:  Intact  Cognition:  WNL  Sleep:  Number of Hours: 8     Treatment Plan Summary: Daily contact with patient to assess and evaluate symptoms and progress in treatment and Medication management PLAN OF CARE:45 year old male with history of MDD presenting with worsening depression in context of grief and substance abuse. At this time patient opting to not start medications for depression or anxiety at this time. Providing grief counseling and supportive psychotherapy at this time. CIWA protocol for acute withdrawals.  Jesse SansMegan M Lucky Alverson, MD 04/01/2020, 4:16 PM

## 2020-04-02 MED ORDER — TRAZODONE HCL 100 MG PO TABS: 100.0000 mg | ORAL_TABLET | Freq: Every day | ORAL | 0 refills | Status: DC

## 2020-04-02 MED ORDER — MELATONIN 5 MG PO TABS: 2.5000 mg | ORAL_TABLET | Freq: Every day | ORAL | 0 refills | Status: DC

## 2020-04-02 NOTE — Plan of Care (Signed)
Pleasant and cooperative and expressing readiness for discharge. MD notified for reevaluation.

## 2020-04-02 NOTE — Plan of Care (Signed)
  Problem: Education: Goal: Knowledge of [facility] Education information/materials will improve Outcome: Progressing Goal: Emotional status will improve Outcome: Progressing Goal: Mental status will improve Outcome: Progressing Goal: Verbalization of understanding the information provided will improve Outcome: Progressing   Problem: Safety: Goal: Periods of time without injury will increase Outcome: Progressing   Problem: Education: Goal: Ability to make informed decisions regarding treatment will improve Outcome: Progressing   Problem: Medication: Goal: Compliance with prescribed medication regimen will improve Outcome: Progressing   

## 2020-04-02 NOTE — [facility] Suicide Risk Assessment (Signed)
[facility] Discharge Suicide Risk Assessment   Principal Problem: Tobacco abuse Discharge Diagnoses: Principal Problem:   Tobacco abuse Active Problems:   MDD (major depressive disorder), recurrent episode, severe (HCC)   Total Time spent with patient: 20 minutes  Physical Findings: AIMS:  , ,  ,  ,    CIWA:  CIWA-Ar Total: 1 COWS:    General Appearance:Well Groomed  Eye Contact:Good  Speech:Clear and Coherent and Normal Rate  Volume:Normal  Mood:Good  Affect:Congruent  Thought Process:Coherent and Linear  Orientation:Full (Time, Place, and Person)  Thought Content:Logical  Suicidal Thoughts:No  Homicidal Thoughts:No  Memory:IGood  Judgement:Intact  Insight:Present  Psychomotor Activity:Normal  Concentration:Concentration:good  Recall:good  Fund of Knowledge:good  Language:good  Akathisia:Negative  Handed:Right  AIMS (if indicated):   Assets: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  Patients presenting with no risk factors but with morbid ruminations; may be classified as minimal risk based on the severity of the depressive symptoms    Plan Of Care/Follow-up recommendations:  Activity:  regular  Reggie Pile, MD 04/02/2020, 10:34 AM

## 2020-04-02 NOTE — Discharge Summary (Signed)
Physician Discharge Summary Note  Patient:  Parker Wilson is an 45 y.o., male MRN:  [id number] DOB:  Apr 23, 1975 Patient phone:  [phone] (home)  Patient address:   [address] [place] [address],  Total Time spent with patient: 30 minutes  Date of Admission:  03/30/2020 Date of Discharge: 04/02/2020  HOSPITAL COURSE: Patient request to be discharged today. He denies suicidal or homicidal plan intent, drive or preparation. He came in due to grief and suicide regarding death of his girlfriend. He harbored some guilt about this. He was not started on his psychiatric medications for depression and anxiety as he had been on many without benefit, per his preference. He was started on trazodone and melatonin. Today is my first time meeting with a patient that says that his opiate withdrawal has been completed. He is feeling stable and is looking forward to getting back to his work. He does live along is able to care for himself independently. He has no psychiatric or medical distress. He is AAOX3 If the patient was suicidal or homicidal ideations please call 911 or go to the local emergency department. Patient voices agreement and understanding.  Reason for Admission:  Suicidal ideations  Principal Problem: Tobacco abuse Discharge Diagnoses: Principal Problem:   Tobacco abuse Active Problems:   MDD (major depressive disorder), recurrent episode, severe (HCC)     Past Medical History:  Past Medical History:  Diagnosis Date  . Anxiety   . Chronic back pain   . Chronic headaches   . Depression   . Insomnia   . Scoliosis   . Vitamin D deficiency     Past Surgical History:  Procedure Laterality Date  . tubes in ears     age 74 or 45 y.o    Family History:  Family History  Problem Relation Age of Onset  . Heart disease Father        cabg x 3   . Hypertension Father   . Arthritis Father   . Depression Father   . Hyperlipidemia Father   . Crohn's disease Father   .  Hypertension Brother   . Alcohol abuse Brother   . Depression Brother   . Hyperlipidemia Brother   . Cancer Paternal Aunt        breast cancer  . Cancer Paternal Grandfather        Lung cancer  . Alcohol abuse Paternal Grandfather   . Arthritis Paternal Grandfather   . COPD Paternal Grandfather   . Early death Paternal Grandfather   . Heart disease Paternal Grandfather        cabg x 4   . Hyperlipidemia Paternal Grandfather   . Hypertension Paternal Grandfather   . Other Paternal Grandfather        died covid 06-19-19  . Arthritis Mother        RA  . Arthritis Paternal Grandmother   . Depression Paternal Grandmother   . Seizures Paternal Grandmother      Social History:  Social History   Substance and Sexual Activity  Alcohol Use Yes  . Alcohol/week: 6.0 standard drinks  . Types: 6 Cans of beer per week     Social History   Substance and Sexual Activity  Drug Use Yes  . Types: Marijuana   Comment: Occasional use approximately every 2-3 weeks    Social History   Socioeconomic History  . Marital status: Divorced    Spouse name: Not on file  . Number of children: Not  on file  . Years of education: Not on file  . Highest education level: Not on file  Occupational History  . Not on file  Tobacco Use  . Smoking status: Current Every Day Smoker    Packs/day: 1.50    Years: 15.00    Pack years: 22.50    Types: Cigarettes  . Smokeless tobacco: Never Used  . Tobacco comment: x 12-13 years as of 10/08/17   Substance and Sexual Activity  . Alcohol use: Yes    Alcohol/week: 6.0 standard drinks    Types: 6 Cans of beer per week  . Drug use: Yes    Types: Marijuana    Comment: Occasional use approximately every 2-3 weeks  . Sexual activity: Yes  Other Topics Concern  . Not on file  Social History Narrative   No guns    Wears seat belt    Single   As of 03/15/20 drinking 2-4 beers daily and 1 ppd cigs   Social Determinants of Health   Financial Resource Strain:  Not on file  Food Insecurity: Not on file  Transportation Needs: Not on file  Physical Activity: Not on file  Stress: Not on file  Social Connections: Not on file       Physical Findings: AIMS:  , ,  ,  ,    CIWA:  CIWA-Ar Total: 1 COWS:    General Appearance: Well Groomed  Eye Contact:  Good  Speech:  Clear and Coherent and Normal Rate  Volume:  Normal  Mood:Good  Affect:  Congruent  Thought Process:  Coherent and Linear  Orientation:  Full (Time, Place, and Person)  Thought Content:  Logical  Suicidal Thoughts:  No  Homicidal Thoughts:  No  Memory:  IGood  Judgement:  Intact  Insight:  Present  Psychomotor Activity:  Normal  Concentration:  Concentration: good  Recall:  good  Fund of Knowledge:  good  Language:  good  Akathisia:  Negative  Handed:  Right  AIMS (if indicated):     Assets:  Communication Skills Desire for Improvement Financial Resources/Insurance Housing Leisure Time Physical Health Resilience Social Support Talents/Skills Transportation Vocational/Educational  ADL's:  Intact  Cognition:  WNL  Sleep:  Number of Hours: 8     Have you used any form of tobacco in the last 30 days? (Cigarettes, Smokeless Tobacco, Cigars, and/or Pipes): No  Has this patient used any form of tobacco in the last 30 days? (Cigarettes, Smokeless Tobacco, Cigars, and/or Pipes) Yes, Yes, Prescription not provided due to Drug interaction (for all of the FDA-approved medications) with other drugs the patient is currently taking and Prescription not provided because: 1800 quit line  Blood Alcohol level:  Lab Results  Component Value Date   ETH 63 (H) 03/30/2020   ETH  06/15/2007    <5        LOWEST DETECTABLE LIMIT FOR SERUM ALCOHOL IS 11 mg/dL FOR MEDICAL PURPOSES ONLY    Metabolic Disorder Labs:  No results found for: HGBA1C, MPG No results found for: PROLACTIN Lab Results  Component Value Date   CHOL 158 03/15/2020   TRIG 170.0 (H) 03/15/2020   HDL  52.80 03/15/2020   CHOLHDL 3 03/15/2020   VLDL 34.0 03/15/2020   LDLCALC 71 03/15/2020   LDLCALC 67 10/09/2017    See Psychiatric Specialty Exam and Suicide Risk Assessment completed by Attending Physician prior to discharge.  Discharge destination:  Home  Is patient on multiple antipsychotic therapies at discharge:  No  Has Patient had three or more failed trials of antipsychotic monotherapy by history:  No     Discharge Instructions    Diet - low sodium heart healthy   Complete by: As directed    Diet general   Complete by: As directed    Discharge instructions   Complete by: As directed    Diet as tolerated   Increase activity slowly   Complete by: As directed      Allergies as of 04/02/2020      Reactions   Gabapentin Nausea Only      Medication List    STOP taking these medications   clonazePAM 0.5 MG tablet Commonly known as: KLONOPIN   cyclobenzaprine 10 MG tablet Commonly known as: FLEXERIL   ibuprofen 200 MG tablet Commonly known as: ADVIL     TAKE these medications     Indication  melatonin 5 MG Tabs Take 0.5 tablets (2.5 mg total) by mouth at bedtime.  Indication: Trouble Sleeping   traZODone 100 MG tablet Commonly known as: DESYREL Take 1 tablet (100 mg total) by mouth at bedtime.  Indication: insomnia        Follow-up recommendations:  Activity:  regular     Signed: Reggie Pile, MD 04/02/2020, 10:30 AM

## 2020-04-02 NOTE — [facility] Group Notes (Signed)
LCSW Group Therapy Note  04/02/2020 1:37 PM  Type of Therapy and Topic:  Group Therapy: Avoiding Self-Sabotaging and Enabling Behaviors  Participation Level:  Did Not Attend   Description of Group:   In this group, patients will learn how to identify obstacles, self-sabotaging and enabling behaviors, as well as: what are they, why do we do them and what needs these behaviors meet. Discuss unhealthy relationships and how to have positive healthy boundaries with those that sabotage and enable. Explore aspects of self-sabotage and enabling in yourself and how to limit these self-destructive behaviors in everyday life.   Therapeutic Goals: 1. Patient will identify one obstacle that relates to self-sabotage and enabling behaviors 2. Patient will identify one personal self-sabotaging or enabling behavior they did prior to admission 3. Patient will state a plan to change the above identified behavior 4. Patient will demonstrate ability to communicate their needs through discussion and/or role play.   Summary of Patient Progress: Patient did not attend group despite encouraged participation.   Therapeutic Modalities:   Cognitive Behavioral Therapy Person-Centered Therapy Motivational Interviewing   Ayoub Arey, MSW, LCSWA, LCASA 04/02/2020 1:37 PM  

## 2020-04-02 NOTE — Progress Notes (Signed)
Patient denies SI, HI and AVH. Is complaining of being unable to sleep at 0230am. Patient's q12hour Ativan changed to 2030 and 0830 by pharmacy since patient said he did not want to be awakended at 0400

## 2020-04-02 NOTE — Progress Notes (Signed)
  [facility] Adult Case Management Discharge Plan :  Will you be returning to the same living situation after discharge:  Yes,  Patient to return to place of residence.  At discharge, do you have transportation home?: Yes,  Patient's father to assist with transportation.  Do you have the ability to pay for your medications: Yes,  BCBS PPO  Release of information consent forms completed and in the chart;  Patient's signature needed at discharge.  Patient to Follow up at:  Follow-up Information    [facility] Follow up.   Specialty: Behavioral Health Why: Please call [phone] to schedule a "hospital discharge appointment." Supporting medical documentation will be sent as requested.  Contact information: [address] [place] [address] [phone]              Next level of care provider has access to [facility] Link:no  Safety Planning and Suicide Prevention discussed: Yes,  SPE completed with patient; patient declined consent for CSW to contact collateral.  Have you used any form of tobacco in the last 30 days? (Cigarettes, Smokeless Tobacco, Cigars, and/or Pipes): No  Has patient been referred to the Quitline?: Patient refused referral  Patient has been referred for addiction treatment: Pt. refused referral  Corky Crafts, LCSWA 04/02/2020, 11:31 AM

## 2020-04-02 NOTE — Progress Notes (Signed)
Patient discharged per MD order. Discharge instructions provided and patient verbalized understanding. Denied SI/HI/VAH. No sign of distress noted.

## 2020-04-05 ENCOUNTER — Ambulatory Visit: Payer: BC Managed Care – PPO | Admitting: Internal Medicine

## 2020-04-05 ENCOUNTER — Encounter: Payer: Self-pay | Admitting: Internal Medicine

## 2020-04-12 ENCOUNTER — Encounter: Payer: Self-pay | Admitting: Internal Medicine

## 2020-04-12 ENCOUNTER — Telehealth: Payer: BC Managed Care – PPO | Admitting: Internal Medicine

## 2020-04-12 VITALS — Ht 70.0 in | Wt 145.0 lb

## 2020-04-12 DIAGNOSIS — F339 Major depressive disorder, recurrent, unspecified: Secondary | ICD-10-CM | POA: Diagnosis not present

## 2020-04-12 DIAGNOSIS — F32A Depression, unspecified: Secondary | ICD-10-CM

## 2020-04-12 DIAGNOSIS — Z8659 Personal history of other mental and behavioral disorders: Secondary | ICD-10-CM | POA: Diagnosis not present

## 2020-04-12 DIAGNOSIS — F419 Anxiety disorder, unspecified: Secondary | ICD-10-CM

## 2020-04-12 DIAGNOSIS — F5104 Psychophysiologic insomnia: Secondary | ICD-10-CM | POA: Diagnosis not present

## 2020-04-12 DIAGNOSIS — F4321 Adjustment disorder with depressed mood: Secondary | ICD-10-CM

## 2020-04-12 NOTE — Addendum Note (Signed)
Addended by: Quentin Ore on: 04/12/2020 04:20 PM   Modules accepted: Orders

## 2020-04-12 NOTE — Progress Notes (Addendum)
Virtual Visit via Video Note  I connected with Parker Wilson  on 04/12/20 at  2:15 PM EST by a video enabled telemedicine application and verified that I am speaking with the correct person using two identifiers.  Location patient: car Location provider:work or home office Persons participating in the virtual visit: patient, provider  I discussed the limitations of evaluation and management by telemedicine and the availability of in person appointments. The patient expressed understanding and agreed to proceed.   HPI: 1. chronic insomnia, recurrent depression/anxiey [facility] hospitalized 03/30/20 to 1/29 feeling better mood improved he was grieving after former fiance Mayer Camel died due to drug overdose >1.5 months ago he does not want to be on klonopin any longer and wants to seek therapy will give me the name.  He is no longer suicidal    ROS: See pertinent positives and negatives per HPI.  Past Medical History:  Diagnosis Date  . Anxiety   . Chronic back pain   . Chronic headaches   . COVID-19    2021  . Depression   . Insomnia   . Scoliosis   . Vitamin D deficiency     Past Surgical History:  Procedure Laterality Date  . tubes in ears     age 45 or 45 y.o     No current outpatient medications on file.  EXAM:  VITALS per patient if applicable:  GENERAL: alert, oriented, appears well and in no acute distress  HEENT: atraumatic, conjunttiva clear, no obvious abnormalities on inspection of external nose and ears  NECK: normal movements of the head and neck  LUNGS: on inspection no signs of respiratory distress, breathing rate appears normal, no obvious gross SOB, gasping or wheezing  CV: no obvious cyanosis  MS: moves all visible extremities without noticeable abnormality  PSYCH/NEURO: pleasant and cooperative, no obvious depression or anxiety, speech and thought processing grossly intact  ASSESSMENT AND PLAN:  Discussed the following assessment and plan:  Chronic  insomnia - Plan: Ambulatory referral to Psychology Anxiety and depression - Plan: Ambulatory referral to Psychology History of suicidal ideation - Plan: Ambulatory referral to Psychology  Depression, recurrent (HCC) - Plan: Ambulatory referral to Psychology  Grief - Plan: Ambulatory referral to Psychology Will my chart me name of therapist and I will place referral doing well now not suicidal  Does not want to take benzos any longer will just do therapy   -we discussed possible serious and likely etiologies, options for evaluation and workup, limitations of telemedicine visit vs in person visit, treatment, treatment risks and precautions.  I discussed the assessment and treatment plan with the patient. The patient was provided an opportunity to ask questions and all were answered. The patient agreed with the plan and demonstrated an understanding of the instructions.    Time spent 20 min Bevelyn Buckles, MD
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
## 2020-06-15 ENCOUNTER — Telehealth: Payer: Self-pay | Admitting: Internal Medicine

## 2020-06-15 NOTE — Telephone Encounter (Signed)
lft vm for pt to call ofc follow up [facility]. thanks

## 2020-07-01 ENCOUNTER — Telehealth: Payer: Self-pay | Admitting: Internal Medicine

## 2020-07-01 NOTE — Telephone Encounter (Signed)
lft pt vm to call ofc to follow up with referral to [facility]. thanks

## 2020-07-15 ENCOUNTER — Ambulatory Visit: Payer: BC Managed Care – PPO | Admitting: Internal Medicine

## 2020-07-15 ENCOUNTER — Telehealth: Payer: Self-pay | Admitting: Internal Medicine

## 2020-07-15 DIAGNOSIS — Z0289 Encounter for other administrative examinations: Secondary | ICD-10-CM

## 2020-07-15 NOTE — Telephone Encounter (Signed)
Patient no-showed today's appointment; appointment was for 07/15/20 at 11:00 am, provider notified for review of record. Letter sent for patient to call in and re-schedule.

## 2020-09-13 ENCOUNTER — Ambulatory Visit: Payer: BC Managed Care – PPO | Admitting: Internal Medicine

## 2020-09-15 ENCOUNTER — Telehealth: Payer: Self-pay | Admitting: Internal Medicine

## 2020-09-15 NOTE — Telephone Encounter (Signed)
Lft pt vm to call ofc to follow up on referral. thanks 

## 2020-09-20 ENCOUNTER — Other Ambulatory Visit: Payer: Self-pay

## 2020-09-20 ENCOUNTER — Emergency Department
Admission: EM | Admit: 2020-09-20 | Discharge: 2020-09-20 | Disposition: A | Discharge status: Home / Self Care | Payer: Self-pay | Attending: Emergency Medicine | Admitting: Emergency Medicine

## 2020-09-20 DIAGNOSIS — F32A Depression, unspecified: Secondary | ICD-10-CM | POA: Insufficient documentation

## 2020-09-20 DIAGNOSIS — Z5321 Procedure and treatment not carried out due to patient leaving prior to being seen by health care provider: Secondary | ICD-10-CM | POA: Insufficient documentation

## 2020-09-20 DIAGNOSIS — R45851 Suicidal ideations: Secondary | ICD-10-CM | POA: Insufficient documentation

## 2020-09-20 DIAGNOSIS — Y907 Blood alcohol level of 200-239 mg/100 ml: Secondary | ICD-10-CM | POA: Insufficient documentation

## 2020-09-20 DIAGNOSIS — F1290 Cannabis use, unspecified, uncomplicated: Secondary | ICD-10-CM | POA: Insufficient documentation

## 2020-09-20 DIAGNOSIS — F1099 Alcohol use, unspecified with unspecified alcohol-induced disorder: Secondary | ICD-10-CM | POA: Insufficient documentation

## 2020-09-20 LAB — ACETAMINOPHEN LEVEL: Acetaminophen (Tylenol), Serum: <10 ug/mL — ABNORMAL LOW (ref 10–30)

## 2020-09-20 LAB — COMPREHENSIVE METABOLIC PANEL
ALT: 17 U/L (ref 0–44)
AST: 26 U/L (ref 15–41)
Albumin: 4.5 g/dL (ref 3.5–5.0)
Alkaline Phosphatase: 59 U/L (ref 38–126)
Anion gap: 11 (ref 5–15)
BUN: 14 mg/dL (ref 6–20)
CO2: 25 mmol/L (ref 22–32)
Calcium: 8.7 mg/dL — ABNORMAL LOW (ref 8.9–10.3)
Chloride: 94 mmol/L — ABNORMAL LOW (ref 98–111)
Creatinine, Ser: 0.96 mg/dL (ref 0.61–1.24)
GFR, Estimated: >60 mL/min (ref >60)
Glucose, Bld: 88 mg/dL (ref 70–99)
Potassium: 3.8 mmol/L (ref 3.5–5.1)
Sodium: 130 mmol/L — ABNORMAL LOW (ref 135–145)
Total Bilirubin: 0.7 mg/dL (ref 0.3–1.2)
Total Protein: 7.3 g/dL (ref 6.5–8.1)

## 2020-09-20 LAB — CBC
HCT: 37.6 % — ABNORMAL LOW (ref 39.0–52.0)
Hemoglobin: 13.4 g/dL (ref 13.0–17.0)
MCH: 33.3 pg (ref 26.0–34.0)
MCHC: 35.6 g/dL (ref 30.0–36.0)
MCV: 93.3 fL (ref 80.0–100.0)
Platelets: 323 10*3/uL (ref 150–400)
RBC: 4.03 MIL/uL — ABNORMAL LOW (ref 4.22–5.81)
RDW: 12.7 % (ref 11.5–15.5)
WBC: 10.6 10*3/uL — ABNORMAL HIGH (ref 4.0–10.5)
nRBC: 0 % (ref 0.0–0.2)

## 2020-09-20 LAB — ETHANOL: Alcohol, Ethyl (B): 213 mg/dL — ABNORMAL HIGH (ref <10)

## 2020-09-20 LAB — SALICYLATE LEVEL: Salicylate Lvl: <7 mg/dL — ABNORMAL LOW (ref 7.0–30.0)

## 2020-09-20 NOTE — ED Notes (Signed)
Charge RN called [employer], spoke to [place], to do a wellness check on patient.

## 2020-09-20 NOTE — ED Notes (Signed)
Pt stating he is wanting to leave. Pt states this is not going to do him any good. Pt changed clothes and dressed back into street clothes. Pt advised to please stay and at least speak with a provider. Pt agrees.

## 2020-09-20 NOTE — ED Provider Notes (Addendum)
Patient had told the triage nurse he did not want to live anymore.  He was brought back while waiting for me as I was speaking to pediatrics about 1 patient and cardiology about another 1 who has a dangerous looking pericardial effusion he got tired of waiting and walked out.  We are currently trying to find him.  If we cannot find him quickly I will take out papers on him and call the police.  He is not in the waiting room he has not in the emergency department that we can see and he is not in the parking lot.   Arnaldo Natal, MD 09/20/20 1931 Also we called one of the 2 numbers we have on him and it was not his number we will try and the other number quickly first.   Arnaldo Natal, MD 09/20/20 [phone]

## 2020-09-20 NOTE — ED Notes (Signed)
Pt dressed out into hospital attire. Pt's belongings to include: 1 black hat 1 black pants 1 black shirt 1 white shirt 2 black shoes 1 Black underwear

## 2020-09-20 NOTE — ED Notes (Signed)
[employer] called back stating unable to locate patient at this time.  States will continue to search and will notify ED if found.

## 2020-09-20 NOTE — ED Triage Notes (Signed)
Pt comes with c/o depression and SI. Pt states he lost his girlfriend awhile back and has been having hard time getting over this. Pt states some marijuana and alcohol use.  Pt tearful in triage.

## 2021-02-03 ENCOUNTER — Ambulatory Visit: Payer: BC Managed Care – PPO | Admitting: Internal Medicine

## 2021-03-03 ENCOUNTER — Ambulatory Visit: Payer: BC Managed Care – PPO | Admitting: Internal Medicine

## 2021-03-24 ENCOUNTER — Encounter: Payer: Self-pay | Admitting: Internal Medicine

## 2021-03-24 ENCOUNTER — Other Ambulatory Visit: Payer: Self-pay

## 2021-03-24 ENCOUNTER — Ambulatory Visit: Payer: BC Managed Care – PPO | Admitting: Internal Medicine

## 2021-03-24 VITALS — BP 100/66 | HR 82 | Temp 98.1°F | Ht 70.0 in | Wt 124.6 lb

## 2021-03-24 DIAGNOSIS — F339 Major depressive disorder, recurrent, unspecified: Secondary | ICD-10-CM

## 2021-03-24 DIAGNOSIS — Z8659 Personal history of other mental and behavioral disorders: Secondary | ICD-10-CM

## 2021-03-24 DIAGNOSIS — G4700 Insomnia, unspecified: Secondary | ICD-10-CM

## 2021-03-24 DIAGNOSIS — F419 Anxiety disorder, unspecified: Secondary | ICD-10-CM | POA: Diagnosis not present

## 2021-03-24 DIAGNOSIS — F1911 Other psychoactive substance abuse, in remission: Secondary | ICD-10-CM | POA: Insufficient documentation
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
## 2021-03-24 NOTE — Patient Instructions (Addendum)
[facility] [place]  [address] [place] [address] [phone]    [facility]  [address]  [place] [address]  [phone] Consider Amy Eulah Pont   Supplements over the counter anxiety  Calm magnesium for anxiety  Ashwaghana  L theanine 100-200 mg daily brand nature made

## 2021-10-01 ENCOUNTER — Ambulatory Visit (HOSPITAL_COMMUNITY)
Admission: EM | Admit: 2021-10-01 | Discharge: 2021-10-01 | Disposition: A | Discharge status: Home / Self Care | Payer: Self-pay | Attending: Psychiatry | Admitting: Psychiatry

## 2021-10-01 DIAGNOSIS — F419 Anxiety disorder, unspecified: Secondary | ICD-10-CM

## 2021-10-01 DIAGNOSIS — F332 Major depressive disorder, recurrent severe without psychotic features: Secondary | ICD-10-CM

## 2021-10-01 MED ORDER — OLANZAPINE 5 MG PO TBDP
5.0000 mg | ORAL_TABLET | Freq: Once | ORAL | Status: AC
  Administered 2021-10-01: 5 mg via ORAL
  Filled 2021-10-01: qty 1

## 2021-10-01 MED ORDER — OLANZAPINE 5 MG PO TABS: 5.0000 mg | ORAL_TABLET | Freq: Two times a day (BID) | ORAL | 0 refills | Status: DC
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
## 2021-10-01 NOTE — ED Triage Notes (Signed)
Pt presents to [facility] accompanied by his father due to ongoing depression from the passing of his fiance approximately 1 year ago. Pt states he never processed the death of his fiance, he instead abused pain pills. Pt reports he stopped abusing pain pills approximately 1 month ago and as a result of this his feelings/depression started to resurface. Pt states he is extremely aggresive and agitated all of the time. Pt has fists balled up and shaking during triage. Pt instructed to take deep breaths to calm down. Pt demonstrated deep breathing exercise and was able to calm down. Pt states " I just feel like bitch for crying, so instead I just get angry". Pt states he does not feel like therapy would be helpful but is open to recommendations. Pt extremely anxious about assessment process and feels comfortable only when the door is open. Pt states " I don't want to feel like I am in jail". Pt reports consuming "a few cups of jack daniels, a few beers, and THC" yesterday. Pt reports fair appetite, and lack of sleep. Pt denies SI/HI,NSSIB, and AVH.

## 2021-10-01 NOTE — Progress Notes (Signed)
Gust received his AVS, questions answered and he was escorted out of the facility.

## 2021-10-01 NOTE — Discharge Instructions (Addendum)
Please present to one of the following facilities to start medication management and therapy services:   [facility] [address],  [place], [place] [address] [phone]   [facility]  [address],  [place], [place] [address] [phone]        The suicide prevention education provided includes the following: Suicide risk factors Suicide prevention and interventions National Suicide Hotline telephone number [facility] assessment telephone number [facility] Emergency Assistance 911 [facility] and/or Residential Mobile Crisis Unit telephone number   Request made of family/significant other to: Remove weapons (e.g., guns, rifles, knives), all items previously/currently identified as safety concern.   Remove drugs/medications (over the counter, prescriptions, illicit drugs), all items previously/currently identified as a safety concern.

## 2021-10-03 ENCOUNTER — Telehealth: Payer: Self-pay | Admitting: Internal Medicine

## 2021-10-03 NOTE — Telephone Encounter (Signed)
Received call from person identifying himself as patient's father Nicholson Starace Sr.States patient was seen at [facility] over the weekend and needs to schedule a follow up appointment to get medication refills. States meds. are helping. No DPR or ROI on chart to allow speaking with other parties. Advised caller that patient will have to call to schedule and he agreed to have him call. Reviewed [facility] note and it appears the patient may have active substance use issues which will require referral to a different clinic should he call back.

## 2021-10-04 ENCOUNTER — Ambulatory Visit (HOSPITAL_COMMUNITY)
Admission: EM | Admit: 2021-10-04 | Discharge: 2021-10-04 | Disposition: A | Discharge status: Home / Self Care | Payer: BC Managed Care – PPO | Attending: Psychiatry | Admitting: Psychiatry

## 2021-10-04 ENCOUNTER — Telehealth (HOSPITAL_COMMUNITY): Payer: Self-pay | Admitting: Psychiatry

## 2021-10-04 DIAGNOSIS — T50905A Adverse effect of unspecified drugs, medicaments and biological substances, initial encounter: Secondary | ICD-10-CM

## 2021-10-04 MED ORDER — TOPIRAMATE 50 MG PO TABS: 50.0000 mg | ORAL_TABLET | Freq: Two times a day (BID) | ORAL | 0 refills | Status: DC

## 2021-10-04 MED ORDER — TRIAMCINOLONE ACETONIDE 0.5 % EX OINT: 1.0000 | TOPICAL_OINTMENT | Freq: Two times a day (BID) | CUTANEOUS | 0 refills | Status: DC

## 2021-10-04 NOTE — Telephone Encounter (Signed)
D:  Darrick Grinder, NP referred pt to MH-IOP.  A:  Placed call to orient, but there was no answer.  Left vm for patient to call the case manager back. Inform Adela Lank.

## 2021-10-04 NOTE — Progress Notes (Signed)
   10/04/21 1039  [facility] Triage Screening (Walk-ins at [facility] only)  How Did You Hear About [place]? Self  What Is the Reason for Your Visit/Call Today? Patient was just here at [facility] on Sunday and started on zyprexa.  He has had a reaction to the medication, showing hives on arms, neck, back, legs, etc.  He would like to speak with a provider about this and needs to discuss options.  How Long Has This Been Causing You Problems? <Week  Have You Recently Had Any Thoughts About Hurting Yourself? No  Are You Planning to Commit Suicide/Harm Yourself At This time? No  Have you Recently Had Thoughts About Hurting Someone Karolee Ohs? No  Are You Planning To Harm Someone At This Time? No  Are you currently experiencing any auditory, visual or other hallucinations? No  Have You Used Any Alcohol or Drugs in the Past 24 Hours? No  Do you have any current medical co-morbidities that require immediate attention? No  Clinician description of patient physical appearance/behavior: Patient reports the medication was helping, he has been calmer since starting...currently, somewhat anxious d/t concerns about medication reaction  What Do You Feel Would Help You the Most Today? Medication(s)  If access to [facility] was not available, would you have sought care in the Emergency Department? No  Determination of Need Routine (7 days)  Options For Referral Medication Management;Outpatient Therapy

## 2021-10-04 NOTE — Discharge Instructions (Addendum)
You are scheduled to see Dr. Vanetta Shawl at [facility].  At your request, you have been referred to the PHP/IOP program. Please look out for a phone call from staff. If you have questions, please reach out to [phone].  Patient is instructed prior to discharge to: Take all medications as prescribed by his/her mental healthcare provider. Report any adverse effects and or reactions from the medicines to his/her outpatient provider promptly. Keep all scheduled appointments, to ensure that you are getting refills on time and to avoid any interruption in your medication.  If you are unable to keep an appointment call to reschedule.  Be sure to follow-up with resources and follow-up appointments provided.  Patient has been instructed & cautioned: To not engage in alcohol and or illegal drug use while on prescription medicines. In the event of worsening symptoms, patient is instructed to call the crisis hotline, 911 and or go to the nearest ED for appropriate evaluation and treatment of symptoms. To follow-up with his/her primary care provider for your other medical issues, concerns and or health care needs.

## 2021-10-04 NOTE — ED Provider Notes (Signed)
Behavioral Health Urgent Care Medical Screening Exam  Patient Name: Parker Wilson. MRN: [id number] Date of Evaluation: 10/04/21 Chief Complaint:   Diagnosis:  Final diagnoses:  Adverse effect of drug, initial encounter   History of Present illness: Parker Saadeh. is a 46 y.o. male. Pt presents voluntarily to [facility] behavioral health for walk-in assessment.  Pt is accompanied by his father, who remains with pt throughout the assessment, as per pt request and verbal consent. Pt is assessed face-to-face.   Pt is a 46 y/o male w/ hx of anxiety, depression. Per chart review, pt was seen at this facility on 10/01/21. At that time, pt was discharged and given rx for Zyprexa 5mg  BID for aggression, mood stabilization, and anxiety.   Pt presents today stating he is getting a rash from the Zyprexa. Notable rash observed on bilateral upper and lower extremities, trunk, back, neck, face. He has been taking Zyprexa since Sunday. He last took Zyprexa yesterday evening. Pt denies that rash is itchy, burning. He denies fever, chills, cp, palpitations, sob, abd pain, n/v/c/d, HA, dizziness. He states he noticed the rash yesterday. Per pt, he has been taking Zyprexa 5mg  BID as rx'd. He states that he feels that it has been helping him. He states he feels his anger is controllable, he has been able to sleep at least 8 hours a night, he has decreased drinking to 1 to 2 beers a day.   Pt reports hx of NSSIB, "punching stuff" to hurt himself such as "appliances".  Pt reports he has had 2 inpatient psychiatric hospitalizations.   Pt denies suicidal, homicidal, or violent ideations. He denies auditory visual hallucinations, paranoia.   Pt reports he is living alone.  Pt is currently unemployed. He is switching jobs. His new job is starting on 10/16/21.   Pt's highest level of education is 2 associate degrees.   Pt denies access to firearms. Pt's father confirms pt does not have access to firearms.    Pt states he is set up with an appointment with a psychiatrist on Saturday. Per chart review, pt is scheduled to see Dr. 10/18/21 on 10/07/21 at 8:00AM. Discussed attending this appointment. Also offered referral to PHP/IOP program. Pt expresses interest in PHP/IOP referral. He gives best contact information at [phone]; [email or web address].   Pt was seen w/ Dr. 12/07/21. Discussed concerns regarding possible DRESS reaction. Discussed recommendation for discontinuing Zyprexa and going to the ED to receive medical workup, including labwork and rule out of organ involvement. Pt states he is not willing to do this. Discussed going to primary care or urgent care if pt is not willing to go to the ED. Pt states he does not have a primary care provider and is not willing go to an urgent care. Discussed other medication options such as gabapentin, topiramate, depakote. Pt states he is not willing to try other medications. He is adamant that Zyprexa is the only medication he is willing to take. He verbalizes understanding of risks of continuing to take Zyprexa, including possibility of DRESS reaction, although states he does not care as he has found a medication that works for him. He verbalizes intent to continue taking Zyprexa. Discussed this was against current recommendation. Discussed follow-up for psychiatric services as already scheduled, and recommended establishing care with a PCP for monitoring of rash and organ systems. Discussed again recommendation for discontinuing Zyprexa and transfer to ED, going to PCP or Urgent Care for medical clearance. Pt agrees for 7 day  rx for Topamax 50mg  BID and Kenalog 0.5% to be provided to him. Pt states he will likely not take this medication and will just continue with Zyprexa. Pt then left facility. Pt's father states he will talk to pt further about recommendations.  Pt is a&ox3, in no acute distress, non-toxic appearing. Pt appears casually dressed, fairly groomed. Eye  contact is good. Speech is clear and coherent w/ nml rate and volume. Reported mood is anxious, depressed. Affect is congruent. TP is coherent, goal directed, linear. Description of associations is intact. TC is logical. There is no evidence of responding to internal stimuli, agitation, aggression or distractibility. No delusions or paranoia elicited. Pt appears agitated and is combative during assessment.  Psychiatric Specialty Exam  Presentation  General Appearance:Casual; Fairly Groomed  Eye Contact:Good  Speech:Clear and Coherent; Normal Rate  Speech Volume:Normal  Handedness:Right   Mood and Affect  Mood:Anxious; Depressed  Affect:Congruent   Thought Process  Thought Processes:Coherent; Goal Directed; Linear  Descriptions of Associations:Intact  Orientation:Full (Time, Place and Person)  Thought Content:Logical  Diagnosis of Schizophrenia or Schizoaffective disorder in past: No data recorded  Hallucinations:None  Ideas of Reference:None  Suicidal Thoughts:No  Homicidal Thoughts:No   Sensorium  Memory:Immediate Good; Recent Good; Remote Good  Judgment:Poor  Insight:Good   Executive Functions  Concentration:Good  Attention Span:Good  Recall:Good  Fund of Knowledge:Good  Language:Good   Psychomotor Activity  Psychomotor Activity:Normal   Assets  Assets:Communication Skills; Desire for Improvement; Financial Resources/Insurance; Housing; Social Support   Sleep  Sleep:Good  Number of hours: 8   No data recorded  Physical Exam: Physical Exam Constitutional:      Appearance: Normal appearance.  Cardiovascular:     Rate and Rhythm: Normal rate.  Pulmonary:     Effort: Pulmonary effort is normal.  Skin:    Comments: Rash noted on bilateral upper and lower extremities, trunk, back, neck, face  Neurological:     Mental Status: He is alert and oriented to person, place, and time.  Psychiatric:        Attention and Perception: Attention  and perception normal.        Mood and Affect: Mood is anxious and depressed.        Speech: Speech normal.        Behavior: Behavior is agitated and combative.        Thought Content: Thought content normal.        Cognition and Memory: Cognition and memory normal.    Review of Systems  Constitutional:  Negative for chills and fever.  Respiratory:  Negative for shortness of breath.   Cardiovascular:  Negative for chest pain and palpitations.  Gastrointestinal:  Negative for abdominal pain, constipation, diarrhea, nausea and vomiting.  Skin:  Positive for rash.       Rash noted on bilateral upper and lower extremities, trunk, neck, face  Neurological:  Negative for dizziness and headaches.  Psychiatric/Behavioral:  Positive for depression. The patient is nervous/anxious.    Blood pressure (!) 130/93, temperature 97.9 F (36.6 C), temperature source Oral, resp. rate 18, SpO2 100 %. There is no height or weight on file to calculate BMI.  Musculoskeletal: Strength & Muscle Tone: within normal limits Gait & Station: normal Patient leans: N/A   [facility] MSE Discharge Disposition for Follow up and Recommendations: Based on my evaluation the patient does not appear to have an emergency medical condition and can be discharged with resources and follow up care in outpatient services for Medication Management, Partial  Hospitalization Program, and Intensive Outpatient Program  Lauree Chandler, [place] 10/04/2021, 2:48 PM

## 2021-10-05 ENCOUNTER — Ambulatory Visit (HOSPITAL_COMMUNITY)
Admission: EM | Admit: 2021-10-05 | Discharge: 2021-10-05 | Disposition: A | Discharge status: Home / Self Care | Payer: BC Managed Care – PPO

## 2021-10-05 ENCOUNTER — Telehealth: Payer: Self-pay | Admitting: Internal Medicine

## 2021-10-05 DIAGNOSIS — F419 Anxiety disorder, unspecified: Secondary | ICD-10-CM

## 2021-10-05 NOTE — Telephone Encounter (Signed)
Contacted [facility] per provider covering Saturday clinic to notify of Saturday clinic appointment being cancelled due to patient requiring higher/different level of care however he had already been discharged. Contacted patient to make him aware of cancellation and gave him referral information for two alternatives - [facility] and [facility].

## 2021-10-05 NOTE — Discharge Instructions (Addendum)
It was recommended for you to go to the emergency room for medical clearance. If you do not go to the emergency room, please establish primary care services or go to the nearest urgent care.   You were referred to the PHP/IOP program yesterday. A staff member should be reaching out to you to schedule an appointment.  You have an appointment with Dr. Vanetta Shawl on 10/07/21 at 8AM.

## 2021-10-05 NOTE — ED Provider Notes (Signed)
Behavioral Health Urgent Care Medical Screening Exam  Patient Name: Parker Wilson. MRN: [id number] Date of Evaluation: 10/05/21 Chief Complaint:   Diagnosis:  Final diagnoses:  Anxiety   History of Present illness: Parker Wilson. is a 46 y.o. male. Pt presents voluntarily to [facility] behavioral health for walk-in assessment. Pt is assessed face-to-face by nurse practitioner.   Pt is a 46 y/o male w/ hx of anxiety, depression. Pt was seen at this facility on 10/01/21. At that time, pt was discharged and given rx for Zyprexa 5mg  BID for aggression, mood stabilization, and anxiety. He was seen by this and Dr. Clinical research associate on 10/04/21 after presenting w/ complaint of getting a rash from the Zyprexa. Pt was recommended for transfer to the ED for medical clearance and to stop taking Zyprexa and given rx for Topamax 50mg  BID and Kenalog 0.5%. Pt declined transfer to the ED and left facility.   Pt reports current anxious, depressed mood. He denies SI/VI/HI, AVH, paranoia. Pt states he did not take the Zyprexa yesterday following discharge. He states he did take the Topamax 50mg  BID and Kenalog 0.5%. He states he slept 2 hours last night. He states last night prior to putting on the Kenalog 0.5%, his upper extremities were swollen, although since putting on the cream, he believes they are now improving. He states that he experienced HA, abd pain since yesterday and is currently experiencing HA, abd pain. I discussed w/ pt continued recommendation for transfer to the ED for medical clearance. Pt declines transfer to the ED for medical clearance. Discussed if he is declining transfer to the ED, recommendation to establish primary care services or go to urgent care. Pt verbalized understanding of risks of not receiving medical clearance, including worsening condition, possible death. Pt becomes increasingly agitated throughout discussion. He terminates the assessment and asks to leave. Reminded pt about  referral to PHP/IOP program yesterday. Also discussed his appointment w/ Dr. 12/04/21 on 10/07/21 at 8AM.  Pt is a&ox3, in no acute distress, non-toxic appearing. He appears casually dressed, fairly groomed. Eye contact is good. Speech is clear and coherent, w/ nml rate and volume. Reported mood is anxious, depressed. Affect is congruent. TP is coherent, goal directed, linear. Description of associations is intact. TC is logical. There is no evidence of internal preoccupation. No delusions or paranoia elicited.   Psychiatric Specialty Exam  Presentation  General Appearance:Casual; Fairly Groomed  Eye Contact:Good  Speech:Clear and Coherent; Normal Rate  Speech Volume:Normal  Handedness:Right   Mood and Affect  Mood:Anxious; Depressed  Affect:Congruent   Thought Process  Thought Processes:Coherent; Goal Directed; Linear  Descriptions of Associations:Intact  Orientation:Full (Time, Place and Person)  Thought Content:Logical  Diagnosis of Schizophrenia or Schizoaffective disorder in past: No data recorded  Hallucinations:None  Ideas of Reference:None  Suicidal Thoughts:No  Homicidal Thoughts:No   Sensorium  Memory:Immediate Good; Recent Good; Remote Good  Judgment:Poor  Insight:Good   Executive Functions  Concentration:Good  Attention Span:Good  Recall:Good  Fund of Knowledge:Good  Language:Good   Psychomotor Activity  Psychomotor Activity:Normal   Assets  Assets:Communication Skills; Desire for Improvement; Financial Resources/Insurance; Housing; Social Support   Sleep  Sleep:Poor  Number of hours: 2   No data recorded  Physical Exam: Physical Exam Cardiovascular:     Rate and Rhythm: Normal rate.  Pulmonary:     Effort: Pulmonary effort is normal.  Skin:    Comments: Rash noted on bilateral upper and lower extremities, trunk, back, neck, face  Neurological:     Mental Status: He is alert and oriented to person, place, and time.   Psychiatric:        Attention and Perception: Attention and perception normal.        Mood and Affect: Mood is anxious and depressed.        Speech: Speech normal.        Behavior: Behavior is agitated.        Thought Content: Thought content normal.    Review of Systems  Constitutional:  Negative for chills and fever.  Respiratory:  Negative for shortness of breath.   Cardiovascular:  Negative for chest pain and palpitations.  Gastrointestinal:  Positive for abdominal pain.  Skin:        Rash on bilateral upper and lower extremities, trunk, back, neck, face   Neurological:  Positive for headaches.  Psychiatric/Behavioral:  Positive for depression. The patient is nervous/anxious.    Pulse 79, temperature 98.1 F (36.7 C), temperature source Oral, resp. rate 18, SpO2 100 %. There is no height or weight on file to calculate BMI.  Musculoskeletal: Strength & Muscle Tone: within normal limits Gait & Station: normal Patient leans: N/A  [facility] MSE Discharge Disposition for Follow up and Recommendations: Based on my evaluation, the patient was recommended to go to the emergency room for medical clearance, although declined.  Lauree Chandler, NP 10/05/2021, 9:27 AM

## 2021-10-05 NOTE — ED Notes (Signed)
Patient left AMA.

## 2021-10-05 NOTE — BH Assessment (Signed)
Parker Wilson, Routine; 46 year old presents to [facility] voluntarily unaccompanied at this time with complaints of anxious/panic attacks. Pt denies SI, HI or AVH.  Pt reports drinking beer and smoking marijuana on 10/04/21.  Pt reports reaction to  medication he received several days ago.  Pt admits piror MH diagnosis or prescribed medication for symptom management.  MSE signed by patient.

## 2021-10-07 ENCOUNTER — Ambulatory Visit (HOSPITAL_COMMUNITY): Payer: BC Managed Care – PPO | Admitting: Psychiatry

## 2021-10-10 ENCOUNTER — Other Ambulatory Visit: Payer: Self-pay

## 2021-10-10 ENCOUNTER — Emergency Department
Admission: EM | Admit: 2021-10-10 | Discharge: 2021-10-10 | Discharge status: Against Medical Advice | Payer: Self-pay | Attending: Physician Assistant | Admitting: Physician Assistant

## 2021-10-10 ENCOUNTER — Emergency Department: Payer: Self-pay

## 2021-10-10 DIAGNOSIS — R440 Auditory hallucinations: Secondary | ICD-10-CM | POA: Insufficient documentation

## 2021-10-10 DIAGNOSIS — R208 Other disturbances of skin sensation: Secondary | ICD-10-CM | POA: Insufficient documentation

## 2021-10-10 DIAGNOSIS — F1410 Cocaine abuse, uncomplicated: Secondary | ICD-10-CM | POA: Insufficient documentation

## 2021-10-10 DIAGNOSIS — R202 Paresthesia of skin: Secondary | ICD-10-CM | POA: Insufficient documentation

## 2021-10-10 DIAGNOSIS — R0602 Shortness of breath: Secondary | ICD-10-CM | POA: Insufficient documentation

## 2021-10-10 DIAGNOSIS — Z5321 Procedure and treatment not carried out due to patient leaving prior to being seen by health care provider: Secondary | ICD-10-CM | POA: Insufficient documentation

## 2021-10-10 DIAGNOSIS — F1210 Cannabis abuse, uncomplicated: Secondary | ICD-10-CM | POA: Insufficient documentation

## 2021-10-10 DIAGNOSIS — H538 Other visual disturbances: Secondary | ICD-10-CM | POA: Insufficient documentation

## 2021-10-10 LAB — COMPREHENSIVE METABOLIC PANEL
ALT: 45 U/L — ABNORMAL HIGH (ref 0–44)
AST: 23 U/L (ref 15–41)
Albumin: 4.5 g/dL (ref 3.5–5.0)
Alkaline Phosphatase: 65 U/L (ref 38–126)
Anion gap: 5 (ref 5–15)
BUN: 17 mg/dL (ref 6–20)
CO2: 23 mmol/L (ref 22–32)
Calcium: 9.1 mg/dL (ref 8.9–10.3)
Chloride: 111 mmol/L (ref 98–111)
Creatinine, Ser: 1.15 mg/dL (ref 0.61–1.24)
GFR, Estimated: >60 mL/min (ref >60)
Glucose, Bld: 102 mg/dL — ABNORMAL HIGH (ref 70–99)
Potassium: 4.4 mmol/L (ref 3.5–5.1)
Sodium: 139 mmol/L (ref 135–145)
Total Bilirubin: 0.4 mg/dL (ref 0.3–1.2)
Total Protein: 7.3 g/dL (ref 6.5–8.1)

## 2021-10-10 LAB — CBC WITH DIFFERENTIAL/PLATELET
Abs Immature Granulocytes: 0.03 10*3/uL (ref 0.00–0.07)
Basophils Absolute: 0.1 10*3/uL (ref 0.0–0.1)
Basophils Relative: 1 %
Eosinophils Absolute: 0.7 10*3/uL — ABNORMAL HIGH (ref 0.0–0.5)
Eosinophils Relative: 8 %
HCT: 41.1 % (ref 39.0–52.0)
Hemoglobin: 13.6 g/dL (ref 13.0–17.0)
Immature Granulocytes: 0 %
Lymphocytes Relative: 30 %
Lymphs Abs: 2.7 10*3/uL (ref 0.7–4.0)
MCH: 31.6 pg (ref 26.0–34.0)
MCHC: 33.1 g/dL (ref 30.0–36.0)
MCV: 95.4 fL (ref 80.0–100.0)
Monocytes Absolute: 0.8 10*3/uL (ref 0.1–1.0)
Monocytes Relative: 8 %
Neutro Abs: 4.9 10*3/uL (ref 1.7–7.7)
Neutrophils Relative %: 53 %
Platelets: 285 10*3/uL (ref 150–400)
RBC: 4.31 MIL/uL (ref 4.22–5.81)
RDW: 13.8 % (ref 11.5–15.5)
WBC: 9.2 10*3/uL (ref 4.0–10.5)
nRBC: 0 % (ref 0.0–0.2)

## 2021-10-10 LAB — URINALYSIS, ROUTINE W REFLEX MICROSCOPIC
Bilirubin Urine: NEGATIVE
Glucose, UA: NEGATIVE mg/dL
Hgb urine dipstick: NEGATIVE
Ketones, ur: NEGATIVE mg/dL
Leukocytes,Ua: NEGATIVE
Nitrite: NEGATIVE
Protein, ur: NEGATIVE mg/dL
Specific Gravity, Urine: 1.004 — ABNORMAL LOW (ref 1.005–1.030)
pH: 6 (ref 5.0–8.0)

## 2021-10-10 LAB — URINE DRUG SCREEN, QUALITATIVE (ARMC ONLY)
Amphetamines, Ur Screen: NOT DETECTED
Barbiturates, Ur Screen: NOT DETECTED
Benzodiazepine, Ur Scrn: NOT DETECTED
Cannabinoid 50 Ng, Ur ~~LOC~~: POSITIVE — AB
Cocaine Metabolite,Ur ~~LOC~~: POSITIVE — AB
MDMA (Ecstasy)Ur Screen: NOT DETECTED
Methadone Scn, Ur: NOT DETECTED
Opiate, Ur Screen: NOT DETECTED
Phencyclidine (PCP) Ur S: NOT DETECTED
Tricyclic, Ur Screen: NOT DETECTED

## 2021-10-10 LAB — TROPONIN I (HIGH SENSITIVITY): Troponin I (High Sensitivity): 2 ng/L (ref <18)

## 2021-10-10 MED ORDER — ALBUTEROL SULFATE (2.5 MG/3ML) 0.083% IN NEBU: 3.0000 mL | INHALATION_SOLUTION | RESPIRATORY_TRACT | Status: DC | PRN

## 2021-10-10 NOTE — ED Provider Triage Note (Signed)
Emergency Medicine Provider Triage Evaluation Note  Frans Valente., a 46 y.o. male  was evaluated in triage.  Pt complains of possible allergic reaction to his previously prescribed Zyprexa.  Patient has multiple complaints including blurry vision, tingling in his fingers, tactile disturbances, shortness of breath.  Review of Systems  Positive: Paresthesias, blurry vision, auditory hallucination Negative: CP  Physical Exam  BP (!) 133/109 (BP Location: Left Arm)   Pulse 86   Temp 98.5 F (36.9 C) (Oral)   Resp 17   Ht 5\' 9"  (1.753 m)   Wt 59 kg   SpO2 98%   BMI 19.20 kg/m  Gen:   Awake, no distress  anxious Resp:  Normal effort CTA MSK:   Moves extremities without difficulty  CVS:  RRR  Medical Decision Making  Medically screening exam initiated at 5:17 PM.  Appropriate orders placed.  . was informed that the remainder of the evaluation will be completed by another provider, this initial triage assessment does not replace that evaluation, and the importance of remaining in the ED until their evaluation is complete.  Patient to the ED for concern of a possible drug reaction related to his psych medication previously prescribed.  Patient continued the medication after he developed some hives.   [name], PA-C 10/10/21 1732

## 2021-10-10 NOTE — ED Triage Notes (Signed)
Pt was seen at [facility] clinic a week ago and was prescribed a medication for his aggression. Pt got hives and stopped taking medication. Pt now has blurry vision, tactile disturbances, and shob.

## 2021-10-16 ENCOUNTER — Telehealth (HOSPITAL_COMMUNITY): Payer: Self-pay | Admitting: Internal Medicine

## 2021-10-16 NOTE — BH Assessment (Signed)
Care Management - [facility] Follow Up Discharges   Writer attempted to make contact with patient today and was unsuccessful.  Writer left a HIPPA compliant voice message.   Per chart review, patient was referred to MH-IOP outpatient services.

## 2022-06-20 ENCOUNTER — Emergency Department
Admission: EM | Admit: 2022-06-20 | Discharge: 2022-06-21 | Disposition: A | Discharge status: Home / Self Care | Payer: 59 | Attending: Emergency Medicine | Admitting: Emergency Medicine

## 2022-06-20 ENCOUNTER — Other Ambulatory Visit: Payer: Self-pay

## 2022-06-20 DIAGNOSIS — R45851 Suicidal ideations: Secondary | ICD-10-CM | POA: Diagnosis not present

## 2022-06-20 DIAGNOSIS — Z1152 Encounter for screening for COVID-19: Secondary | ICD-10-CM | POA: Insufficient documentation

## 2022-06-20 DIAGNOSIS — Z8659 Personal history of other mental and behavioral disorders: Secondary | ICD-10-CM

## 2022-06-20 DIAGNOSIS — Y906 Blood alcohol level of 120-199 mg/100 ml: Secondary | ICD-10-CM | POA: Insufficient documentation

## 2022-06-20 DIAGNOSIS — F419 Anxiety disorder, unspecified: Secondary | ICD-10-CM | POA: Diagnosis present

## 2022-06-20 DIAGNOSIS — F1911 Other psychoactive substance abuse, in remission: Secondary | ICD-10-CM | POA: Diagnosis present

## 2022-06-20 DIAGNOSIS — Z720 Tobacco use: Secondary | ICD-10-CM | POA: Diagnosis present

## 2022-06-20 DIAGNOSIS — F339 Major depressive disorder, recurrent, unspecified: Secondary | ICD-10-CM | POA: Diagnosis present

## 2022-06-20 DIAGNOSIS — F1090 Alcohol use, unspecified, uncomplicated: Secondary | ICD-10-CM

## 2022-06-20 DIAGNOSIS — F332 Major depressive disorder, recurrent severe without psychotic features: Secondary | ICD-10-CM | POA: Diagnosis present

## 2022-06-20 DIAGNOSIS — F10129 Alcohol abuse with intoxication, unspecified: Secondary | ICD-10-CM

## 2022-06-20 DIAGNOSIS — Z789 Other specified health status: Secondary | ICD-10-CM

## 2022-06-20 DIAGNOSIS — F5104 Psychophysiologic insomnia: Secondary | ICD-10-CM | POA: Diagnosis present

## 2022-06-20 LAB — CBC WITH DIFFERENTIAL/PLATELET
Abs Immature Granulocytes: 0.02 10*3/uL (ref 0.00–0.07)
Basophils Absolute: 0.1 10*3/uL (ref 0.0–0.1)
Basophils Relative: 1 %
Eosinophils Absolute: 0.4 10*3/uL (ref 0.0–0.5)
Eosinophils Relative: 4 %
HCT: 44.4 % (ref 39.0–52.0)
Hemoglobin: 14.8 g/dL (ref 13.0–17.0)
Immature Granulocytes: 0 %
Lymphocytes Relative: 39 %
Lymphs Abs: 3.8 10*3/uL (ref 0.7–4.0)
MCH: 31.4 pg (ref 26.0–34.0)
MCHC: 33.3 g/dL (ref 30.0–36.0)
MCV: 94.1 fL (ref 80.0–100.0)
Monocytes Absolute: 0.5 10*3/uL (ref 0.1–1.0)
Monocytes Relative: 5 %
Neutro Abs: 5 10*3/uL (ref 1.7–7.7)
Neutrophils Relative %: 51 %
Platelets: 318 10*3/uL (ref 150–400)
RBC: 4.72 MIL/uL (ref 4.22–5.81)
RDW: 13.2 % (ref 11.5–15.5)
WBC: 9.9 10*3/uL (ref 4.0–10.5)
nRBC: 0 % (ref 0.0–0.2)

## 2022-06-20 LAB — COMPREHENSIVE METABOLIC PANEL
ALT: 16 U/L (ref 0–44)
AST: 21 U/L (ref 15–41)
Albumin: 4.3 g/dL (ref 3.5–5.0)
Alkaline Phosphatase: 62 U/L (ref 38–126)
Anion gap: 12 (ref 5–15)
BUN: 15 mg/dL (ref 6–20)
CO2: 22 mmol/L (ref 22–32)
Calcium: 8.8 mg/dL — ABNORMAL LOW (ref 8.9–10.3)
Chloride: 105 mmol/L (ref 98–111)
Creatinine, Ser: 0.82 mg/dL (ref 0.61–1.24)
GFR, Estimated: >60 mL/min (ref >60)
Glucose, Bld: 93 mg/dL (ref 70–99)
Potassium: 3.4 mmol/L — ABNORMAL LOW (ref 3.5–5.1)
Sodium: 139 mmol/L (ref 135–145)
Total Bilirubin: 0.6 mg/dL (ref 0.3–1.2)
Total Protein: 7.2 g/dL (ref 6.5–8.1)

## 2022-06-20 LAB — SALICYLATE LEVEL: Salicylate Lvl: <7 mg/dL — ABNORMAL LOW (ref 7.0–30.0)

## 2022-06-20 LAB — ETHANOL: Alcohol, Ethyl (B): 180 mg/dL — ABNORMAL HIGH (ref <10)

## 2022-06-20 LAB — ACETAMINOPHEN LEVEL: Acetaminophen (Tylenol), Serum: <10 ug/mL — ABNORMAL LOW (ref 10–30)

## 2022-06-20 NOTE — ED Notes (Signed)
Pt not keeping pulse ox on, educated on importance of keeping it on. Pt still refuses

## 2022-06-20 NOTE — ED Triage Notes (Addendum)
Pt to ED via [facility] c/o alcohol intoxication. Per EMS, pt was found in ditch for an unknown amount of time. Pt alert and oriented x3, and able to answer questions but sometimes does not want to. Pt stats he drank 1/5 of whiskey, took an unknown amount of klonopin, and had some weed use. Per EMS, pt having some SI, family of his recently died. Pt denies SI, HI, hallucinations.  96 CBG 128/86 88HR 24RR 98% RA

## 2022-06-20 NOTE — ED Notes (Signed)
Pt dressed out by this Financial planner. Pt belongings in belongings bag Blue denim jacket Brown bracelet [employer] button up shirt Dark blue jeans Brown belt Dark blue underwear Black pair of socks Gray pair of shoes White t shirt

## 2022-06-20 NOTE — ED Provider Notes (Signed)
[facility] Provider Note    Event Date/Time   First MD Initiated Contact with Patient 06/20/22 2148     (approximate)   History   Alcohol Intoxication and Psychiatric Evaluation   HPI  Parker Wilson. is a 47 y.o. male   Past medical history of chronic back pain, anxiety, depression, substance abuse, who presents to the emergency department with alcohol intoxication, depression, suicidal ideation.  He was found in a ditch by the side of the road and had suicidal commentary, stating that he wanted to die, admits to drinking alcohol and taking Klonopin today.   He is guarded when I interview him.  He admits to drinking alcohol today and taking 1 pill of Klonopin as well as some Remeron to help him sleep.  He has been having poor sleep lately due to depression related to the death of his fiance.  He avoids answering my question when I ask him if he is suicidal.  He states that he wants to be left in the ditch and be left alone.  He reports no other acute medical complaints.   External Medical Documents Reviewed: Behavioral health note from August 2023 for depression and anxiety      Physical Exam   Triage Vital Signs: ED Triage Vitals  Enc Vitals Group     BP      Pulse      Resp      Temp      Temp src      SpO2      Weight      Height      Head Circumference      Peak Flow      Pain Score      Pain Loc      Pain Edu?      Excl. in GC?     Most recent vital signs: Vitals:   06/20/22 2154  BP: (!) 128/97  Pulse: 91  Resp: 18  Temp: 98.1 F (36.7 C)  SpO2: 97%    General: Awake, no distress.  CV:  Good peripheral perfusion.  Resp:  Normal effort.  Abd:  No distention.  Other:  Awake, guarded, answering questions, moving all extremities, no obvious signs of trauma, normal vital signs.  Tearful at times.   ED Results / Procedures / Treatments   Labs (all labs ordered are listed, but only abnormal results are  displayed) Labs Reviewed  ACETAMINOPHEN LEVEL - Abnormal; Notable for the following components:      Result Value   Acetaminophen (Tylenol), Serum <10 (*)    All other components within normal limits  COMPREHENSIVE METABOLIC PANEL - Abnormal; Notable for the following components:   Potassium 3.4 (*)    Calcium 8.8 (*)    All other components within normal limits  ETHANOL - Abnormal; Notable for the following components:   Alcohol, Ethyl (B) 180 (*)    All other components within normal limits  SALICYLATE LEVEL - Abnormal; Notable for the following components:   Salicylate Lvl <7.0 (*)    All other components within normal limits  CBC WITH DIFFERENTIAL/PLATELET  URINE DRUG SCREEN, QUALITATIVE ([facility] ONLY)     I ordered and reviewed the above labs they are notable for negative Tylenol and salicylate levels, ethanol is 180  EKG  ED ECG REPORT I, Pilar Jarvis, the attending physician, personally viewed and interpreted this ECG.   Date: 06/20/2022  EKG Time: 2201  Rate: 88  Rhythm:  nsr  Axis: rad  Intervalsrbbb  ST&T Change: No acute ischemic changes   PROCEDURES:  Critical Care performed: No  Procedures   MEDICATIONS ORDERED IN ED: Medications - No data to display  IMPRESSION / MDM / ASSESSMENT AND PLAN / ED COURSE  I reviewed the triage vital signs and the nursing notes.                                Patient's presentation is most consistent with acute presentation with potential threat to life or bodily function.  Differential diagnosis includes, but is not limited to, intoxication, suicidal ideation   MDM: Patient with alcohol use and depression/suicidal ideation.  No other medical complaints, no signs of trauma, basic labs and toxicologic labs IVC and psychiatry consultation.       FINAL CLINICAL IMPRESSION(S) / ED DIAGNOSES   Final diagnoses:  Alcohol use  Suicidal ideation     Rx / DC Orders   ED Discharge Orders     None        Note:   This document was prepared using Dragon voice recognition software and may include unintentional dictation errors.    Pilar Jarvis, MD 06/20/22 [phone]

## 2022-06-21 DIAGNOSIS — F1090 Alcohol use, unspecified, uncomplicated: Secondary | ICD-10-CM | POA: Diagnosis not present

## 2022-06-21 DIAGNOSIS — F10129 Alcohol abuse with intoxication, unspecified: Secondary | ICD-10-CM | POA: Diagnosis not present

## 2022-06-21 DIAGNOSIS — Z789 Other specified health status: Secondary | ICD-10-CM

## 2022-06-21 LAB — URINE DRUG SCREEN, QUALITATIVE (ARMC ONLY)
Amphetamines, Ur Screen: NOT DETECTED
Barbiturates, Ur Screen: NOT DETECTED
Benzodiazepine, Ur Scrn: POSITIVE — AB
Cannabinoid 50 Ng, Ur ~~LOC~~: POSITIVE — AB
Cocaine Metabolite,Ur ~~LOC~~: NOT DETECTED
MDMA (Ecstasy)Ur Screen: NOT DETECTED
Methadone Scn, Ur: NOT DETECTED
Opiate, Ur Screen: NOT DETECTED
Phencyclidine (PCP) Ur S: NOT DETECTED
Tricyclic, Ur Screen: NOT DETECTED

## 2022-06-21 LAB — RESP PANEL BY RT-PCR (RSV, FLU A&B, COVID)  RVPGX2
Influenza A by PCR: NEGATIVE
Influenza B by PCR: NEGATIVE
Resp Syncytial Virus by PCR: NEGATIVE
SARS Coronavirus 2 by RT PCR: NEGATIVE

## 2022-06-21 MED ORDER — LORAZEPAM 2 MG PO TABS
2.0000 mg | ORAL_TABLET | Freq: Once | ORAL | Status: AC
  Administered 2022-06-21: 2 mg via ORAL
  Filled 2022-06-21: qty 1

## 2022-06-21 MED ORDER — HYDROXYZINE HCL 25 MG PO TABS
50.0000 mg | ORAL_TABLET | Freq: Once | ORAL | Status: AC
  Administered 2022-06-21: 50 mg via ORAL
  Filled 2022-06-21: qty 2

## 2022-06-21 NOTE — Discharge Instructions (Signed)
Patient cleared by psychiatry for discharge home. Cut down your alcohol use.  Return to the ER if develop worsening symptoms or any other concerns

## 2022-06-21 NOTE — ED Notes (Signed)
VOLUNTARY as IVC rescinded by Barthold/Funke and order in also

## 2022-06-21 NOTE — ED Notes (Signed)
Pt asleep when medication was taken to bedside for sleep. Will not wake pt up to admin med for sleep so he can return to sleep. Order placed on hold at this time but still present

## 2022-06-21 NOTE — ED Notes (Signed)
Dr. Dolores Frame states at this time no orders to be placed.

## 2022-06-21 NOTE — BH Assessment (Addendum)
Comprehensive Clinical Assessment (CCA) Note  06/21/2022 Gwin Eagon [id number]  Chief Complaint: Patient is a 47 year old male presenting to [facility] ED under IVC. Per triage note Pt to ED via [facility] c/o alcohol intoxication. Per EMS, pt was found in ditch for an unknown amount of time. Pt alert and oriented x3, and able to answer questions but sometimes does not want to. Pt stats he drank 1/5 of whiskey, took an unknown amount of klonopin, and had some weed use. Per EMS, pt having some SI, family of his recently died. Pt denies SI, HI, hallucinations. During assessment patient appears alert and oriented x4, resistant, tense and guarded during th assessment. Patient reports that tonight "I wanted the police to leave me in that ditch." Patient reports "I used to live with my fiance but she's dead now" he reports that his finace has passed away several years ago. Patient reports that he was clean "for 4 weeks" but recently relapsed. Patient reports drinking alcohol "a 5th or more until I put myself down." Patient's BAL is 180. Patient is upset that he cannot leave and when told that he was IVC'd he reports "then leave then!" When asked if he is experiencing SI patient did not want to answer "I'm not answering that." Patient did deny AH/VH.  Collateral information was obtained from patient's father Sten Dematteo who reports "his anger isn't under control, he gets very angry and he stopped a medication that he was taking a while ago."  Per Psyc NP Elenore Paddy patient to be reassessed Chief Complaint  Patient presents with   Alcohol Intoxication   Psychiatric Evaluation   Visit Diagnosis: Depression, Alcohol intoxication    CCA Screening, Triage and Referral (STR)  Patient Reported Information How did you hear about [place]? Legal System  Referral name: No data recorded Referral phone number: No data recorded  Whom do you see for routine medical problems? No data recorded Practice/Facility Name: No  data recorded Practice/Facility Phone Number: No data recorded Name of Contact: No data recorded Contact Number: No data recorded Contact Fax Number: No data recorded Prescriber Name: No data recorded Prescriber Address (if known): No data recorded  What Is the Reason for Your Visit/Call Today? Pt to ED via [facility] c/o alcohol intoxication. Per EMS, pt was found in ditch for an unknown amount of time. Pt alert and oriented x3, and able to answer questions but sometimes does not want to. Pt stats he drank 1/5 of whiskey, took an unknown amount of klonopin, and had some weed use. Per EMS, pt having some SI, family of his recently died. Pt denies SI, HI, hallucinations.  How Long Has This Been Causing You Problems? > than 6 months  What Do You Feel Would Help You the Most Today? Treatment for Depression or other mood problem   Have You Recently Been in Any Inpatient Treatment (Hospital/Detox/Crisis Center/28-Day Program)? No data recorded Name/Location of Program/Hospital:No data recorded How Long Were You There? No data recorded When Were You Discharged? No data recorded  Have You Ever Received Services From [facility] Before? No data recorded Who Do You See at [facility]? No data recorded  Have You Recently Had Any Thoughts About Hurting Yourself? -- (UTA)  Are You Planning to Commit Suicide/Harm Yourself At This time? -- (UTA)   Have you Recently Had Thoughts About Hurting Someone Karolee Ohs? No  Explanation: No data recorded  Have You Used Any Alcohol or Drugs in the Past 24 Hours? Yes  How Long Ago Did You Use Drugs or Alcohol? No data recorded What Did You Use and How Much? Alcohol   Do You Currently Have a Therapist/Psychiatrist? No  Name of Therapist/Psychiatrist: No data recorded  Have You Been Recently Discharged From Any Office Practice or Programs? No  Explanation of Discharge From Practice/Program: No data recorded    CCA Screening Triage Referral Assessment Type  of Contact: Face-to-Face  Is this Initial or Reassessment? No data recorded Date Telepsych consult ordered in [facility]:  No data recorded Time Telepsych consult ordered in [facility]:  No data recorded  Patient Reported Information Reviewed? No data recorded Patient Left Without Being Seen? No data recorded Reason for Not Completing Assessment: No data recorded  Collateral Involvement: No data recorded  Does Patient Have a Court Appointed Legal Guardian? No data recorded Name and Contact of Legal Guardian: No data recorded If Minor and Not Living with Parent(s), Who has Custody? No data recorded Is CPS involved or ever been involved? Never  Is APS involved or ever been involved? Never   Patient Determined To Be At Risk for Harm To Self or Others Based on Review of Patient Reported Information or Presenting Complaint? No  Method: No data recorded Availability of Means: No data recorded Intent: No data recorded Notification Required: No data recorded Additional Information for Danger to Others Potential: No data recorded Additional Comments for Danger to Others Potential: No data recorded Are There Guns or Other Weapons in Your Home? No  Types of Guns/Weapons: No data recorded Are These Weapons Safely Secured?                            No data recorded Who Could Verify You Are Able To Have These Secured: No data recorded Do You Have any Outstanding Charges, Pending Court Dates, Parole/Probation? No data recorded Contacted To Inform of Risk of Harm To Self or Others: No data recorded  Location of Assessment: [facility] ED   Does Patient Present under Involuntary Commitment? Yes  IVC Papers Initial File Date: No data recorded  [place] of Residence: [place]   Patient Currently Receiving the Following Services: No data recorded  Determination of Need: Emergent (2 hours)   Options For Referral: Medication Management     CCA Biopsychosocial Intake/Chief Complaint:  No data  recorded Current Symptoms/Problems: No data recorded  Patient Reported Schizophrenia/Schizoaffective Diagnosis in Past: No   Strengths: Patient is able to communicate  Preferences: No data recorded Abilities: No data recorded  Type of Services Patient Feels are Needed: No data recorded  Initial Clinical Notes/Concerns: No data recorded  Mental Health Symptoms Depression:   Change in energy/activity; Fatigue; Hopelessness; Irritability; Tearfulness   Duration of Depressive symptoms:  Greater than two weeks   Mania:   None   Anxiety:    None   Psychosis:   None   Duration of Psychotic symptoms: No data recorded  Trauma:   None   Obsessions:   None   Compulsions:   Poor Insight   Inattention:   None   Hyperactivity/Impulsivity:   None   Oppositional/Defiant Behaviors:   None   Emotional Irregularity:   Potentially harmful impulsivity   Other Mood/Personality Symptoms:  No data recorded   Mental Status Exam Appearance and self-care  Stature:   Average   Weight:   Average weight   Clothing:   Casual   Grooming:   Normal   Cosmetic use:   None  Posture/gait:   Tense   Motor activity:   Not Remarkable   Sensorium  Attention:   Normal   Concentration:   Normal   Orientation:   X5   Recall/memory:   Normal   Affect and Mood  Affect:   Flat   Mood:   Depressed   Relating  Eye contact:   Normal   Facial expression:   Tense   Attitude toward examiner:   Defensive; Guarded; Resistant   Thought and Language  Speech flow:  Clear and Coherent   Thought content:   Appropriate to Mood and Circumstances   Preoccupation:   None   Hallucinations:   None   Organization:  No data recorded  [employer] of Knowledge:   Fair   Intelligence:   Average   Abstraction:   Functional   Judgement:   Impaired   Reality Testing:   Adequate   Insight:   Lacking   Decision Making:   Impulsive    Social Functioning  Social Maturity:   Impulsive   Social Judgement:   Heedless   Stress  Stressors:   Grief/losses   Coping Ability:   Contractor Deficits:   None   Supports:   Family     Religion: Religion/Spirituality Are You A Religious Person?: No  Leisure/Recreation: Leisure / Recreation Do You Have Hobbies?: No  Exercise/Diet: Exercise/Diet Do You Exercise?: No Have You Gained or Lost A Significant Amount of Weight in the Past Six Months?: No Do You Follow a Special Diet?: No Do You Have Any Trouble Sleeping?: No   CCA Employment/Education Employment/Work Situation: Employment / Work Situation Employment Situation: Employed Work Stressors: None reported Patient's Job has Been Impacted by Current Illness: No Has Patient ever Been in Equities trader?: No  Education: Education Is Patient Currently Attending School?: No Did You Have An Individualized Education Program (IIEP): No Did You Have Any Difficulty At [employer]?: No Patient's Education Has Been Impacted by Current Illness: No   CCA Family/Childhood History Family and Relationship History: Family history Marital status: Single Does patient have children?: No  Childhood History:  Childhood History Did patient suffer any verbal/emotional/physical/sexual abuse as a child?: No Did patient suffer from severe childhood neglect?: No Has patient ever been sexually abused/assaulted/raped as an adolescent or adult?: No Was the patient ever a victim of a crime or a disaster?: No Witnessed domestic violence?: No Has patient been affected by domestic violence as an adult?: No  Child/Adolescent Assessment:     CCA Substance Use Alcohol/Drug Use: Alcohol / Drug Use Pain Medications: SEE MAR Prescriptions: SEE MAR Over the Counter: SEE MAR History of alcohol / drug use?: Yes Substance #1 Name of Substance 1: Alcohol                       ASAM's:  Six Dimensions of  Multidimensional Assessment  Dimension 1:  Acute Intoxication and/or Withdrawal Potential:      Dimension 2:  Biomedical Conditions and Complications:      Dimension 3:  Emotional, Behavioral, or Cognitive Conditions and Complications:     Dimension 4:  Readiness to Change:     Dimension 5:  Relapse, Continued use, or Continued Problem Potential:     Dimension 6:  Recovery/Living Environment:     ASAM Severity Score:    ASAM Recommended Level of Treatment:     Substance use Disorder (SUD)    Recommendations for Services/Supports/Treatments:  DSM5 Diagnoses: Patient Active Problem List   Diagnosis Date Noted   Alcohol use 06/21/2022   History of substance abuse 03/24/2021   History of suicidal ideation 04/12/2020   MDD (major depressive disorder), recurrent episode, severe 03/31/2020   Annual physical exam 10/01/2018   Abnormal MRI, lumbar spine 01/28/2018   Lumbar herniated disc 01/28/2018   Cervicalgia 01/08/2018   Depression, recurrent 10/08/2017   Chronic insomnia 10/08/2017   Chronic low back pain 10/08/2017   Tobacco abuse 02/26/2012   Headache, chronic daily 02/26/2012   Fatigue 02/26/2012   Anxiety 02/26/2012    Patient Centered Plan: Patient is on the following Treatment Plan(s):  Depression and Substance Abuse   Referrals to Alternative Service(s): Referred to Alternative Service(s):   Place:   Date:   Time:    Referred to Alternative Service(s):   Place:   Date:   Time:    Referred to Alternative Service(s):   Place:   Date:   Time:    Referred to Alternative Service(s):   Place:   Date:   Time:      @[email or web address] @  [employer], LCAS-A

## 2022-06-21 NOTE — ED Notes (Signed)
Pt to ED BHU 8. Calm, cooperative, and interactive with this nurse. Denies SI, HI, AVH. Reports no sleep fro 4-5 days and plan to get some. Pt shown around unit and rules shared. Pt gets in bed and covers with blanket. Requests light out and does not want tv on.

## 2022-06-21 NOTE — ED Notes (Signed)
Pt awake, complaining of not being able to return to sleep. Educated pt on time and informed this nurse would reach out to Dr. Dolores Frame. In meantime pt requested tv and light on. Another blanket provided for comfort. Pt also asks for food as he states he has not had anything for some time, agreed on by this nurse. Pt aware with time and relativity to AM that sleep aid may not be appropriate to help with being awake later for evaluation and daily activities.

## 2022-06-21 NOTE — Consult Note (Signed)
[facility] Psych ED Progress Note  06/21/2022 10:28 AM Parker Wilson.  MRN:  [id number]   Method of visit?: Face to Face  REASSESSMENT  Subjective:  Patient reassessed this morning. He is clinically sober, speaks in clear, coherent sentences. Patient denies suicidal thoughts or intent. Denies  homicidal ideations. Denies auditory or visual hallucinations; perceptions appear intact. Patient  BAL 180; UDS positive for benzodiazepines and cannabinoids.  Patient admits to using Remeron and Clonazepam for "sleep", neither are prescribed for patient.   Patient denies depression. He reports that he was drinking because he could not sleep. He reports that he does not feel he needs any mental health treatment, but "wants something for sleep." Writer asked patient if he would like to be psychiatrically admitted for diagnostic purposes and treatment; patient declined. He states his only problem is poor sleep, getting 4 hours at night. He denies any symptoms of mania or other mood dysregulation. He says irritability or "anger" comes from lack of proper sleep. He reports that he has "dealt with the loss of his fiance." Advised patient we cannot prescribe "something for sleep." Patient was prescribed olanzapine for mood stabilization and had a reaction of a rash. Patient did not follow up with mental health treatment.  Writer spoke with patient's father. He reports that he has no particular concerns for patient's safety, no suicide attempts. He does not own any fire arms. Father agrees that patient would benefit from seeing outpatient mental health providers. Clinical research associate gave father phone number for Lorella Nimrod at [employer]. This information also given to patient.          Principal Problem: Alcohol abuse with intoxication Diagnosis:  Principal Problem:   Alcohol abuse with intoxication Active Problems:   Tobacco abuse   Anxiety   Depression, recurrent   Chronic insomnia   MDD (major depressive disorder), recurrent episode,  severe   History of suicidal ideation   History of substance abuse   Alcohol use  Total Time spent with patient: 45 minutes  Past Psychiatric History: alcohol use; history of substance abuse; history of  SI; depression  Past Medical History:  Past Medical History:  Diagnosis Date   Anxiety    Chronic back pain    Chronic headaches    COVID-19    2021   Depression    Insomnia    Scoliosis    Substance abuse    uds +cocaine, meth, thc and benzos and +etoh c/o uds 03/30/20   Vitamin D deficiency     Past Surgical History:  Procedure Laterality Date   tubes in ears     age 59 or 47 y.o    Family History:  Family History  Problem Relation Age of Onset   Heart disease Father        cabg x 3    Hypertension Father    Arthritis Father    Depression Father    Hyperlipidemia Father    Crohn's disease Father    Hypertension Brother    Alcohol abuse Brother    Depression Brother    Hyperlipidemia Brother    Cancer Paternal Aunt        breast cancer   Cancer Paternal Grandfather        Lung cancer   Alcohol abuse Paternal Grandfather    Arthritis Paternal Grandfather    COPD Paternal Grandfather    Early death Paternal Grandfather    Heart disease Paternal Grandfather        cabg x 4  Hyperlipidemia Paternal Grandfather    Hypertension Paternal Grandfather    Other Paternal Grandfather        died covid 20-May-2019   Arthritis Mother        RA   Arthritis Paternal Grandmother    Depression Paternal Grandmother    Seizures Paternal Grandmother    Family Psychiatric  History:  Social History:  Social History   Substance and Sexual Activity  Alcohol Use Yes   Alcohol/week: 6.0 standard drinks of alcohol   Types: 6 Cans of beer per week   Comment: 6     Social History   Substance and Sexual Activity  Drug Use Yes   Types: Marijuana   Comment: Occasional use approximately every 2-3 weeks    Social History   Socioeconomic History   Marital status: Divorced     Spouse name: Not on file   Number of children: Not on file   Years of education: Not on file   Highest education level: Not on file  Occupational History   Not on file  Tobacco Use   Smoking status: Every Day    Packs/day: 1.00    Years: 15.00    Additional pack years: 0.00    Total pack years: 15.00    Types: Cigarettes   Smokeless tobacco: Never   Tobacco comments:    x 12-13 years as of 10/08/17   Vaping Use   Vaping Use: Every day   Substances: Nicotine, THC, CBD  Substance and Sexual Activity   Alcohol use: Yes    Alcohol/week: 6.0 standard drinks of alcohol    Types: 6 Cans of beer per week    Comment: 6   Drug use: Yes    Types: Marijuana    Comment: Occasional use approximately every 2-3 weeks   Sexual activity: Yes  Other Topics Concern   Not on file  Social History Narrative   No guns    Wears seat belt    Single   As of 03/15/20 drinking 2-4 beers daily and 1 ppd cigs   Likes golf   Social Determinants of Health   Financial Resource Strain: Not on file  Food Insecurity: Not on file  Transportation Needs: Not on file  Physical Activity: Not on file  Stress: Not on file  Social Connections: Not on file    Sleep: Poor  Appetite:  Fair  Current Medications: No current facility-administered medications for this encounter.   Current Outpatient Medications  Medication Sig Dispense Refill   OLANZapine (ZYPREXA) 5 MG tablet Take 1 tablet (5 mg total) by mouth 2 (two) times daily. (Patient not taking: Reported on 06/21/2022) 28 tablet 0   topiramate (TOPAMAX) 50 MG tablet Take 1 tablet (50 mg total) by mouth 2 (two) times daily for 7 days. 14 tablet 0   triamcinolone ointment (KENALOG) 0.5 % Apply 1 Application topically 2 (two) times daily. (Patient not taking: Reported on 06/21/2022) 30 g 0    Lab Results:  Results for orders placed or performed during the hospital encounter of 06/20/22 (from the past 48 hour(s))  Acetaminophen level     Status: Abnormal    Collection Time: 06/20/22 10:08 PM  Result Value Ref Range   Acetaminophen (Tylenol), Serum <10 (L) 10 - 30 ug/mL    Comment: (NOTE) Therapeutic concentrations vary significantly. A range of 10-30 ug/mL  may be an effective concentration for many patients. However, some  are best treated at concentrations outside of this range. Acetaminophen concentrations >150  ug/mL at 4 hours after ingestion  and >50 ug/mL at 12 hours after ingestion are often associated with  toxic reactions.  Performed at [facility], [address] [place], [place] [address]   Comprehensive metabolic panel     Status: Abnormal   Collection Time: 06/20/22 10:08 PM  Result Value Ref Range   Sodium 139 135 - 145 mmol/L   Potassium 3.4 (L) 3.5 - 5.1 mmol/L   Chloride 105 98 - 111 mmol/L   CO2 22 22 - 32 mmol/L   Glucose, Bld 93 70 - 99 mg/dL    Comment: Glucose reference range applies only to samples taken after fasting for at least 8 hours.   BUN 15 6 - 20 mg/dL   Creatinine, Ser 6.04 0.61 - 1.24 mg/dL   Calcium 8.8 (L) 8.9 - 10.3 mg/dL   Total Protein 7.2 6.5 - 8.1 g/dL   Albumin 4.3 3.5 - 5.0 g/dL   AST 21 15 - 41 U/L   ALT 16 0 - 44 U/L   Alkaline Phosphatase 62 38 - 126 U/L   Total Bilirubin 0.6 0.3 - 1.2 mg/dL   GFR, Estimated >54 >09 mL/min    Comment: (NOTE) Calculated using the CKD-EPI Creatinine Equation (2021)    Anion gap 12 5 - 15    Comment: Performed at [facility], [address], [place], [place] [address]  Ethanol     Status: Abnormal   Collection Time: 06/20/22 10:08 PM  Result Value Ref Range   Alcohol, Ethyl (B) 180 (H) <10 mg/dL    Comment: (NOTE) Lowest detectable limit for serum alcohol is 10 mg/dL.  For medical purposes only. Performed at [facility], [address] [place], [place] [address]   Salicylate level     Status: Abnormal   Collection Time: 06/20/22 10:08 PM  Result Value Ref Range   Salicylate Lvl <7.0 (L) 7.0 - 30.0 mg/dL     Comment: Performed at [facility], [address] [place], [place] [address]  CBC with Differential     Status: None   Collection Time: 06/20/22 10:08 PM  Result Value Ref Range   WBC 9.9 4.0 - 10.5 K/uL   RBC 4.72 4.22 - 5.81 MIL/uL   Hemoglobin 14.8 13.0 - 17.0 g/dL   HCT 08.6 57.8 - 46.9 %   MCV 94.1 80.0 - 100.0 fL   MCH 31.4 26.0 - 34.0 pg   MCHC 33.3 30.0 - 36.0 g/dL   RDW 62.9 52.8 - 41.3 %   Platelets 318 150 - 400 K/uL   nRBC 0.0 0.0 - 0.2 %   Neutrophils Relative % 51 %   Neutro Abs 5.0 1.7 - 7.7 K/uL   Lymphocytes Relative 39 %   Lymphs Abs 3.8 0.7 - 4.0 K/uL   Monocytes Relative 5 %   Monocytes Absolute 0.5 0.1 - 1.0 K/uL   Eosinophils Relative 4 %   Eosinophils Absolute 0.4 0.0 - 0.5 K/uL   Basophils Relative 1 %   Basophils Absolute 0.1 0.0 - 0.1 K/uL   Immature Granulocytes 0 %   Abs Immature Granulocytes 0.02 0.00 - 0.07 K/uL    Comment: Performed at [facility], [address] [place], [place] [address]  Resp panel by RT-PCR (RSV, Flu A&B, Covid) Anterior Nasal Swab     Status: None   Collection Time: 06/21/22 12:22 AM   Specimen: Anterior Nasal Swab  Result Value Ref Range   SARS Coronavirus 2  by RT PCR NEGATIVE NEGATIVE    Comment: (NOTE) SARS-CoV-2 target nucleic acids are NOT DETECTED.  The SARS-CoV-2 RNA is generally detectable in upper respiratory specimens during the acute phase of infection. The lowest concentration of SARS-CoV-2 viral copies this assay can detect is 138 copies/mL. A negative result does not preclude SARS-Cov-2 infection and should not be used as the sole basis for treatment or other patient management decisions. A negative result may occur with  improper specimen collection/handling, submission of specimen other than nasopharyngeal swab, presence of viral mutation(s) within the areas targeted by this assay, and inadequate number of viral copies(<138 copies/mL). A negative result must be combined  with clinical observations, patient history, and epidemiological information. The expected result is Negative.  Fact Sheet for Patients:  BloggerCourse.com  Fact Sheet for Healthcare Providers:  SeriousBroker.it  This test is no t yet approved or cleared by the [place] FDA and  has been authorized for detection and/or diagnosis of SARS-CoV-2 by FDA under an Emergency Use Authorization (EUA). This EUA will remain  in effect (meaning this test can be used) for the duration of the COVID-19 declaration under Section 564(b)(1) of the Act, 21 U.S.C.section 360bbb-3(b)(1), unless the authorization is terminated  or revoked sooner.       Influenza A by PCR NEGATIVE NEGATIVE   Influenza B by PCR NEGATIVE NEGATIVE    Comment: (NOTE) The Xpert Xpress SARS-CoV-2/FLU/RSV plus assay is intended as an aid in the diagnosis of influenza from Nasopharyngeal swab specimens and should not be used as a sole basis for treatment. Nasal washings and aspirates are unacceptable for Xpert Xpress SARS-CoV-2/FLU/RSV testing.  Fact Sheet for Patients: BloggerCourse.com  Fact Sheet for Healthcare Providers: SeriousBroker.it  This test is not yet approved or cleared by the [place] FDA and has been authorized for detection and/or diagnosis of SARS-CoV-2 by FDA under an Emergency Use Authorization (EUA). This EUA will remain in effect (meaning this test can be used) for the duration of the COVID-19 declaration under Section 564(b)(1) of the Act, 21 U.S.C. section 360bbb-3(b)(1), unless the authorization is terminated or revoked.     Resp Syncytial Virus by PCR NEGATIVE NEGATIVE    Comment: (NOTE) Fact Sheet for Patients: BloggerCourse.com  Fact Sheet for Healthcare Providers: SeriousBroker.it  This test is not yet approved or cleared by the  [place] FDA and has been authorized for detection and/or diagnosis of SARS-CoV-2 by FDA under an Emergency Use Authorization (EUA). This EUA will remain in effect (meaning this test can be used) for the duration of the COVID-19 declaration under Section 564(b)(1) of the Act, 21 U.S.C. section 360bbb-3(b)(1), unless the authorization is terminated or revoked.  Performed at [facility], [address] [place], [place] [address]   Urine Drug Screen, Qualitative     Status: Abnormal   Collection Time: 06/21/22  8:29 AM  Result Value Ref Range   Tricyclic, Ur Screen NONE DETECTED NONE DETECTED   Amphetamines, Ur Screen NONE DETECTED NONE DETECTED   MDMA (Ecstasy)Ur Screen NONE DETECTED NONE DETECTED   Cocaine Metabolite,Ur [place] NONE DETECTED NONE DETECTED   Opiate, Ur Screen NONE DETECTED NONE DETECTED   Phencyclidine (PCP) Ur S NONE DETECTED NONE DETECTED   Cannabinoid 50 Ng, Ur [place] POSITIVE (A) NONE DETECTED   Barbiturates, Ur Screen NONE DETECTED NONE DETECTED   Benzodiazepine, Ur Scrn POSITIVE (A) NONE DETECTED   Methadone Scn, Ur NONE DETECTED NONE DETECTED    Comment: (NOTE) Tricyclics + metabolites, urine  Cutoff 1000 ng/mL Amphetamines + metabolites, urine  Cutoff 1000 ng/mL MDMA (Ecstasy), urine              Cutoff 500 ng/mL Cocaine Metabolite, urine          Cutoff 300 ng/mL Opiate + metabolites, urine        Cutoff 300 ng/mL Phencyclidine (PCP), urine         Cutoff 25 ng/mL Cannabinoid, urine                 Cutoff 50 ng/mL Barbiturates + metabolites, urine  Cutoff 200 ng/mL Benzodiazepine, urine              Cutoff 200 ng/mL Methadone, urine                   Cutoff 300 ng/mL  The urine drug screen provides only a preliminary, unconfirmed analytical test result and should not be used for non-medical purposes. Clinical consideration and professional judgment should be applied to any positive drug screen result due to possible interfering substances. A  more specific alternate chemical method must be used in order to obtain a confirmed analytical result. Gas chromatography / mass spectrometry (GC/MS) is the preferred confirm atory method. Performed at [facility], [address] [place], [place] [address]     Blood Alcohol level:  Lab Results  Component Value Date   ETH 180 (H) 06/20/2022   ETH 213 (H) 09/20/2020    Physical Findings: AIMS:  , ,  ,  ,    CIWA:    COWS:     Musculoskeletal: Strength & Muscle Tone: within normal limits Gait & Station: normal Patient leans: N/A  Psychiatric Specialty Exam:  Presentation  General Appearance:  Appropriate for Environment  Eye Contact: Good  Speech: Clear and Coherent  Speech Volume: Normal  Handedness: Right   Mood and Affect  Mood: Euthymic  Affect: Congruent   Thought Process  Thought Processes: Coherent; Goal Directed; Linear  Descriptions of Associations:Intact  Orientation:Full (Time, Place and Person)  Thought Content:WDL  History of Schizophrenia/Schizoaffective disorder:No  Duration of Psychotic Symptoms:No data recorded Hallucinations:Hallucinations: None  Ideas of Reference:None  Suicidal Thoughts:Suicidal Thoughts: No  Homicidal Thoughts:Homicidal Thoughts: No   Sensorium  Memory: Immediate Good; Recent Fair  Judgment: Fair  Insight: Poor   Executive Functions  Concentration: Good  Attention Span: Good  Recall: Fair  Fund of Knowledge: Fair  Language: Good   Psychomotor Activity  Psychomotor Activity: Psychomotor Activity: Normal   Assets  Assets: Desire for Improvement; Housing; Resilience; Physical Health   Sleep  Sleep: Sleep: Poor Number of Hours of Sleep: 4    Physical Exam: Physical Exam Vitals and nursing note reviewed.  HENT:     Head: Normocephalic.     Nose: No congestion or rhinorrhea.  Eyes:     General:        Right eye: No discharge.        Left eye: No  discharge.  Pulmonary:     Effort: Pulmonary effort is normal.  Musculoskeletal:        General: Normal range of motion.     Cervical back: Normal range of motion.  Skin:    General: Skin is dry.  Neurological:     Mental Status: He is alert and oriented to person, place, and time.    Review of Systems  Constitutional: Negative.   Respiratory: Negative.    Musculoskeletal: Negative.   Psychiatric/Behavioral:  Positive for  substance abuse. Negative for depression (denies), hallucinations, memory loss and suicidal ideas. The patient has insomnia. The patient is not nervous/anxious.    Blood pressure 123/81, pulse 93, temperature 98.2 F (36.8 C), temperature source Oral, resp. rate 17, SpO2 98 %. There is no height or weight on file to calculate BMI.  Treatment Plan Summary: Plan Discharge with outpatient resources. Information about [facility] given to patient. Also suggested seeing primary care provider at clinics/Health Department in [place].   Patient no longer meets criteria for involuntary commitment and declines inpatient psychiatric hospitalization.  Patient does not appear to be an imminent risk to himself or others. IVC released.  Reviewed with Dr. Fuller Plan.   Vanetta Mulders, NP 06/21/2022, 10:28 AM

## 2022-06-21 NOTE — ED Notes (Signed)
Pt to restroom, unable to get to pt before he finishes to collect urine sample despite educating pt of need on arrival to bhu

## 2022-06-21 NOTE — ED Provider Notes (Signed)
Emergency Medicine Observation Re-evaluation Note  Tylan Kinn. is a 47 y.o. male, seen on rounds today.  Pt initially presented to the ED for complaints of Alcohol Intoxication and Psychiatric Evaluation  Currently, the patient is asleep when I saw around 8AM- had some troubles sleeping overnight and got some meds to help.   Physical Exam  Blood pressure 123/81, pulse 93, temperature 98.2 F (36.8 C), temperature source Oral, resp. rate 17, SpO2 98 %.  Physical Exam General: No apparent distress Pulm: Normal WOB Psych: resting     ED Course / MDM     I have reviewed the labs performed to date as well as medications administered while in observation.  Plan   Current plan is to continue to wait for psych plan/placement if felt warranted  Patient is under full IVC at this time.   9:37 AM discussed case with psychiatry who is cleared patient for discharge home.  IVC was reversed.  I discussed with nurse to ensure he had a safe transport home.    Concha Se, MD 06/21/22 [phone]

## 2022-06-21 NOTE — ED Notes (Addendum)
Pt requests this nurse ask Dr for additional help to sleep. Reports he has not fallen asleep yet, pt has been in bed since arrival to unit. Pt reports self medicating with alcohol and other medications at home to help him sleep with little success recently. Also shared that his fiance died recently and this has stimulated many life choices but the lack of sleep is new. Secure chat sent to Dr. Dolores Frame.

## 2022-06-21 NOTE — ED Notes (Signed)
Pt requested medication to help go back to sleep

## 2022-06-21 NOTE — ED Notes (Addendum)
Patient calm and cooperative at this time. Discharge instructions reviewed with patient. [facility] follow up paperwork provided. Patient verbalized understanding. Belongings returned. RN called patients Father Bayne Fosnaugh to pick patient up.

## 2022-06-21 NOTE — Consult Note (Signed)
[facility] Face-to-Face Psychiatry Consult   Reason for Consult:  Referring Physician:  Patient Identification: Parker Wilson. MRN:  [id number] Principal Diagnosis: <principal problem not specified> Diagnosis:  Active Problems:   Tobacco abuse   Anxiety   Depression, recurrent   Chronic insomnia   MDD (major depressive disorder), recurrent episode, severe   History of suicidal ideation   History of substance abuse   Total Time spent with patient: 1 hour  Subjective:  "What is it to you if I want to harm myself?"  Parker Wilson. is a 47 y.o. male patient presented to [facility] ED under involuntary commitment status (IVC) by [facility] due to alcohol intoxication with a blood alcohol level (BAL) of 180 mg/dl. According to EMS, the patient was found in a ditch for an unknown duration, and upon assessment, reported consuming approximately 1/5 of whiskey, an unknown quantity of klonopin, and marijuana. The patient expressed suicidal ideation (SI), citing the loss of his fiance and dog as contributing factors.  During face-to-face evaluation, the patient appeared alert and oriented x 4, displaying anger, uncooperativeness, and mood-congruent affect. Despite exhibiting irritability, the patient did not manifest signs of responding to internal or external stimuli. No delusional thinking, auditory or visual hallucinations, suicidal, homicidal, or self-harm ideations were reported. Additionally, the patient did not display psychotic or paranoid behaviors and responded appropriately to questioning.  The patient's condition necessitates overnight observation with reassessment in the morning to determine suitability for psychiatric inpatient admission or discharge home. Collaboration with Dr. Modesto Charon was initiated for optimal patient care.  Further monitoring and supportive interventions are indicated to address the patient's alcohol intoxication, emotional distress, and risk of harm to self.  Disposition:The  patient remained under observation overnight and will be reassessed in the a.m. to determine if he meets the criteria for psychiatric inpatient admission; he could be discharged home.  HPI: Per Dr. Modesto Charon, Parker Wilson. is a 47 y.o. male   Past medical history of chronic back pain, anxiety, depression, substance abuse, who presents to the emergency department with alcohol intoxication, depression, suicidal ideation.  He was found in a ditch by the side of the road and had suicidal commentary, stating that he wanted to die, admits to drinking alcohol and taking Klonopin today.     He is guarded when I interview him.  He admits to drinking alcohol today and taking 1 pill of Klonopin as well as some Remeron to help him sleep.  He has been having poor sleep lately due to depression related to the death of his fiance.  He avoids answering my question when I ask him if he is suicidal.  He states that he wants to be left in the ditch and be left alone.   He reports no other acute medical complaints.     External Medical Documents Reviewed: Behavioral health note from August 2023 for depression and anxiety.  Past Psychiatric History:  Anxiety Chronic headaches Depression Insomnia Substance abuse uds +cocaine, meth, thc and benzos and +etoh c/o uds 03/30/20  Risk to Self:   Risk to Others:   Prior Inpatient Therapy:   Prior Outpatient Therapy:    Past Medical History:  Past Medical History:  Diagnosis Date   Anxiety    Chronic back pain    Chronic headaches    COVID-19    2021   Depression    Insomnia    Scoliosis    Substance abuse    uds +cocaine, meth, thc and  benzos and +etoh c/o uds 03/30/20   Vitamin D deficiency     Past Surgical History:  Procedure Laterality Date   tubes in ears     age 53 or 47 y.o    Family History:  Family History  Problem Relation Age of Onset   Heart disease Father        cabg x 3    Hypertension Father    Arthritis Father    Depression Father     Hyperlipidemia Father    Crohn's disease Father    Hypertension Brother    Alcohol abuse Brother    Depression Brother    Hyperlipidemia Brother    Cancer Paternal Aunt        breast cancer   Cancer Paternal Grandfather        Lung cancer   Alcohol abuse Paternal Grandfather    Arthritis Paternal Grandfather    COPD Paternal Grandfather    Early death Paternal Grandfather    Heart disease Paternal Grandfather        cabg x 4    Hyperlipidemia Paternal Grandfather    Hypertension Paternal Grandfather    Other Paternal Grandfather        died covid 05-06-2019   Arthritis Mother        RA   Arthritis Paternal Grandmother    Depression Paternal Grandmother    Seizures Paternal Grandmother    Family Psychiatric  History: History reviewed. No pertinent family psychiatric history. Social History:  Social History   Substance and Sexual Activity  Alcohol Use Yes   Alcohol/week: 6.0 standard drinks of alcohol   Types: 6 Cans of beer per week   Comment: 6     Social History   Substance and Sexual Activity  Drug Use Yes   Types: Marijuana   Comment: Occasional use approximately every 2-3 weeks    Social History   Socioeconomic History   Marital status: Divorced    Spouse name: Not on file   Number of children: Not on file   Years of education: Not on file   Highest education level: Not on file  Occupational History   Not on file  Tobacco Use   Smoking status: Every Day    Packs/day: 1.00    Years: 15.00    Additional pack years: 0.00    Total pack years: 15.00    Types: Cigarettes   Smokeless tobacco: Never   Tobacco comments:    x 12-13 years as of 10/08/17   Vaping Use   Vaping Use: Every day   Substances: Nicotine, THC, CBD  Substance and Sexual Activity   Alcohol use: Yes    Alcohol/week: 6.0 standard drinks of alcohol    Types: 6 Cans of beer per week    Comment: 6   Drug use: Yes    Types: Marijuana    Comment: Occasional use approximately every 2-3  weeks   Sexual activity: Yes  Other Topics Concern   Not on file  Social History Narrative   No guns    Wears seat belt    Single   As of 03/15/20 drinking 2-4 beers daily and 1 ppd cigs   Likes golf   Social Determinants of Health   Financial Resource Strain: Not on file  Food Insecurity: Not on file  Transportation Needs: Not on file  Physical Activity: Not on file  Stress: Not on file  Social Connections: Not on file   Additional Social History:  Allergies:   Allergies  Allergen Reactions   Gabapentin Nausea Only    Labs:  Results for orders placed or performed during the hospital encounter of 06/20/22 (from the past 48 hour(s))  Acetaminophen level     Status: Abnormal   Collection Time: 06/20/22 10:08 PM  Result Value Ref Range   Acetaminophen (Tylenol), Serum <10 (L) 10 - 30 ug/mL    Comment: (NOTE) Therapeutic concentrations vary significantly. A range of 10-30 ug/mL  may be an effective concentration for many patients. However, some  are best treated at concentrations outside of this range. Acetaminophen concentrations >150 ug/mL at 4 hours after ingestion  and >50 ug/mL at 12 hours after ingestion are often associated with  toxic reactions.  Performed at [facility], [address] [place], [place] [address]   Comprehensive metabolic panel     Status: Abnormal   Collection Time: 06/20/22 10:08 PM  Result Value Ref Range   Sodium 139 135 - 145 mmol/L   Potassium 3.4 (L) 3.5 - 5.1 mmol/L   Chloride 105 98 - 111 mmol/L   CO2 22 22 - 32 mmol/L   Glucose, Bld 93 70 - 99 mg/dL    Comment: Glucose reference range applies only to samples taken after fasting for at least 8 hours.   BUN 15 6 - 20 mg/dL   Creatinine, Ser 2.13 0.61 - 1.24 mg/dL   Calcium 8.8 (L) 8.9 - 10.3 mg/dL   Total Protein 7.2 6.5 - 8.1 g/dL   Albumin 4.3 3.5 - 5.0 g/dL   AST 21 15 - 41 U/L   ALT 16 0 - 44 U/L   Alkaline Phosphatase 62 38 - 126 U/L   Total Bilirubin 0.6  0.3 - 1.2 mg/dL   GFR, Estimated >08 >65 mL/min    Comment: (NOTE) Calculated using the CKD-EPI Creatinine Equation (2021)    Anion gap 12 5 - 15    Comment: Performed at [facility], [address], [place], [place] [address]  Ethanol     Status: Abnormal   Collection Time: 06/20/22 10:08 PM  Result Value Ref Range   Alcohol, Ethyl (B) 180 (H) <10 mg/dL    Comment: (NOTE) Lowest detectable limit for serum alcohol is 10 mg/dL.  For medical purposes only. Performed at [facility], [address] [place], [place] [address]   Salicylate level     Status: Abnormal   Collection Time: 06/20/22 10:08 PM  Result Value Ref Range   Salicylate Lvl <7.0 (L) 7.0 - 30.0 mg/dL    Comment: Performed at [facility], [address] [place], [place] [address]  CBC with Differential     Status: None   Collection Time: 06/20/22 10:08 PM  Result Value Ref Range   WBC 9.9 4.0 - 10.5 K/uL   RBC 4.72 4.22 - 5.81 MIL/uL   Hemoglobin 14.8 13.0 - 17.0 g/dL   HCT 44.0 10.2 - 72.5 %   MCV 94.1 80.0 - 100.0 fL   MCH 31.4 26.0 - 34.0 pg   MCHC 33.3 30.0 - 36.0 g/dL   RDW 36.6 44.0 - 34.7 %   Platelets 318 150 - 400 K/uL   nRBC 0.0 0.0 - 0.2 %   Neutrophils Relative % 51 %   Neutro Abs 5.0 1.7 - 7.7 K/uL   Lymphocytes Relative 39 %   Lymphs Abs 3.8 0.7 - 4.0 K/uL   Monocytes Relative 5 %   Monocytes Absolute 0.5 0.1 - 1.0  K/uL   Eosinophils Relative 4 %   Eosinophils Absolute 0.4 0.0 - 0.5 K/uL   Basophils Relative 1 %   Basophils Absolute 0.1 0.0 - 0.1 K/uL   Immature Granulocytes 0 %   Abs Immature Granulocytes 0.02 0.00 - 0.07 K/uL    Comment: Performed at [facility], [address] [place], [place] [address]    No current facility-administered medications for this encounter.   Current Outpatient Medications  Medication Sig Dispense Refill   Ibuprofen (ADVIL PO) Take by mouth as needed.     OLANZapine (ZYPREXA) 5 MG tablet Take 1 tablet (5 mg  total) by mouth 2 (two) times daily. 28 tablet 0   topiramate (TOPAMAX) 50 MG tablet Take 1 tablet (50 mg total) by mouth 2 (two) times daily for 7 days. 14 tablet 0   triamcinolone ointment (KENALOG) 0.5 % Apply 1 Application topically 2 (two) times daily. 30 g 0    Musculoskeletal: Strength & Muscle Tone: within normal limits Gait & Station: normal Patient leans: N/A Psychiatric Specialty Exam:  Presentation  General Appearance:  Bizarre  Eye Contact: Good  Speech: Clear and Coherent  Speech Volume: Normal  Handedness: Right   Mood and Affect  Mood: Anxious; Irritable  Affect: Congruent   Thought Process  Thought Processes: Coherent  Descriptions of Associations:Intact  Orientation:Full (Time, Place and Person)  Thought Content:Logical  History of Schizophrenia/Schizoaffective disorder:No data recorded Duration of Psychotic Symptoms:No data recorded Hallucinations:Hallucinations: None  Ideas of Reference:None  Suicidal Thoughts:Suicidal Thoughts: No  Homicidal Thoughts:Homicidal Thoughts: No   Sensorium  Memory: Immediate Good; Recent Good; Remote Good  Judgment: Poor  Insight: Poor   Executive Functions  Concentration: Poor  Attention Span: Good  Recall: Fair  Fund of Knowledge: Good  Language: Good   Psychomotor Activity  Psychomotor Activity: Psychomotor Activity: Normal   Assets  Assets: Communication Skills; Desire for Improvement; Resilience; Social Support   Sleep  Sleep: Sleep: Poor Number of Hours of Sleep: 4   Physical Exam: Physical Exam Vitals and nursing note reviewed.  Constitutional:      Appearance: Normal appearance. He is normal weight.  HENT:     Head: Normocephalic and atraumatic.     Right Ear: External ear normal.     Left Ear: External ear normal.     Nose: Nose normal.     Mouth/Throat:     Mouth: Mucous membranes are moist.  Cardiovascular:     Rate and Rhythm: Normal rate.      Pulses: Normal pulses.  Pulmonary:     Effort: Pulmonary effort is normal.  Musculoskeletal:        General: Normal range of motion.     Cervical back: Normal range of motion and neck supple.  Neurological:     General: No focal deficit present.     Mental Status: He is alert and oriented to person, place, and time.  Psychiatric:        Attention and Perception: Attention and perception normal.        Mood and Affect: Mood is anxious. Affect is angry and inappropriate.        Speech: Speech normal.        Behavior: Behavior is uncooperative and agitated.        Thought Content: Thought content normal.        Cognition and Memory: Cognition and memory normal.        Judgment: Judgment is impulsive and inappropriate.    Review  of Systems  Psychiatric/Behavioral:  Positive for depression and substance abuse. The patient is nervous/anxious and has insomnia.    Blood pressure (!) 128/97, pulse 91, temperature 98.1 F (36.7 C), temperature source Oral, resp. rate 18, SpO2 97 %. There is no height or weight on file to calculate BMI.  Treatment Plan Summary: Plan   The patient remained under observation overnight and will be reassessed in the a.m. to determine if he meets the criteria for psychiatric inpatient admission; he could be discharged home.  Disposition: Supportive therapy provided about ongoing stressors. The patient remained under observation overnight and will be reassessed in the a.m. to determine if she meets the criteria for psychiatric inpatient admission; she could be discharged home.  Gillermo Murdoch, NP 06/21/2022 12:17 AM

## 2022-06-21 NOTE — ED Notes (Signed)
ivc by MD Wong/psych consult ordered/pending.. 

## 2022-07-21 ENCOUNTER — Emergency Department
Admission: EM | Admit: 2022-07-21 | Discharge: 2022-07-22 | Disposition: A | Discharge status: Home / Self Care | Payer: No Typology Code available for payment source | Attending: Emergency Medicine | Admitting: Emergency Medicine

## 2022-07-21 ENCOUNTER — Other Ambulatory Visit: Payer: Self-pay

## 2022-07-21 DIAGNOSIS — F1210 Cannabis abuse, uncomplicated: Secondary | ICD-10-CM | POA: Insufficient documentation

## 2022-07-21 DIAGNOSIS — F332 Major depressive disorder, recurrent severe without psychotic features: Secondary | ICD-10-CM | POA: Insufficient documentation

## 2022-07-21 DIAGNOSIS — F32A Depression, unspecified: Secondary | ICD-10-CM

## 2022-07-21 DIAGNOSIS — F1410 Cocaine abuse, uncomplicated: Secondary | ICD-10-CM | POA: Diagnosis not present

## 2022-07-21 DIAGNOSIS — Y907 Blood alcohol level of 200-239 mg/100 ml: Secondary | ICD-10-CM | POA: Insufficient documentation

## 2022-07-21 LAB — COMPREHENSIVE METABOLIC PANEL
ALT: 17 U/L (ref 0–44)
AST: 26 U/L (ref 15–41)
Albumin: 4.6 g/dL (ref 3.5–5.0)
Alkaline Phosphatase: 70 U/L (ref 38–126)
Anion gap: 11 (ref 5–15)
BUN: 8 mg/dL (ref 6–20)
CO2: 25 mmol/L (ref 22–32)
Calcium: 8.6 mg/dL — ABNORMAL LOW (ref 8.9–10.3)
Chloride: 97 mmol/L — ABNORMAL LOW (ref 98–111)
Creatinine, Ser: 0.93 mg/dL (ref 0.61–1.24)
GFR, Estimated: >60 mL/min (ref >60)
Glucose, Bld: 110 mg/dL — ABNORMAL HIGH (ref 70–99)
Potassium: 3.6 mmol/L (ref 3.5–5.1)
Sodium: 133 mmol/L — ABNORMAL LOW (ref 135–145)
Total Bilirubin: 0.4 mg/dL (ref 0.3–1.2)
Total Protein: 7.4 g/dL (ref 6.5–8.1)

## 2022-07-21 LAB — URINE DRUG SCREEN, QUALITATIVE (ARMC ONLY)
Amphetamines, Ur Screen: NOT DETECTED
Barbiturates, Ur Screen: NOT DETECTED
Benzodiazepine, Ur Scrn: POSITIVE — AB
Cannabinoid 50 Ng, Ur ~~LOC~~: POSITIVE — AB
Cocaine Metabolite,Ur ~~LOC~~: POSITIVE — AB
MDMA (Ecstasy)Ur Screen: NOT DETECTED
Methadone Scn, Ur: NOT DETECTED
Opiate, Ur Screen: NOT DETECTED
Phencyclidine (PCP) Ur S: NOT DETECTED
Tricyclic, Ur Screen: NOT DETECTED

## 2022-07-21 LAB — CBC
HCT: 41.4 % (ref 39.0–52.0)
Hemoglobin: 14.1 g/dL (ref 13.0–17.0)
MCH: 32 pg (ref 26.0–34.0)
MCHC: 34.1 g/dL (ref 30.0–36.0)
MCV: 94.1 fL (ref 80.0–100.0)
Platelets: 373 10*3/uL (ref 150–400)
RBC: 4.4 MIL/uL (ref 4.22–5.81)
RDW: 13.8 % (ref 11.5–15.5)
WBC: 9.9 10*3/uL (ref 4.0–10.5)
nRBC: 0 % (ref 0.0–0.2)

## 2022-07-21 LAB — ACETAMINOPHEN LEVEL: Acetaminophen (Tylenol), Serum: <10 ug/mL — ABNORMAL LOW (ref 10–30)

## 2022-07-21 LAB — ETHANOL: Alcohol, Ethyl (B): 201 mg/dL — ABNORMAL HIGH (ref <10)

## 2022-07-21 LAB — SALICYLATE LEVEL: Salicylate Lvl: <7 mg/dL — ABNORMAL LOW (ref 7.0–30.0)

## 2022-07-21 MED ORDER — ZIPRASIDONE MESYLATE 20 MG IM SOLR
20.0000 mg | Freq: Once | INTRAMUSCULAR | Status: AC
  Administered 2022-07-21: 20 mg via INTRAMUSCULAR
  Filled 2022-07-21: qty 20

## 2022-07-21 NOTE — ED Notes (Signed)
Dressed out into hospital provided wine colored scrubs. All belongings gathered and placed in secure holding.

## 2022-07-21 NOTE — ED Provider Notes (Signed)
   [facility] Provider Note    Event Date/Time   First MD Initiated Contact with Patient 07/21/22 2050     (approximate)  History   Chief Complaint: Psychiatric Evaluation  HPI  Parker Worch. is a 47 y.o. male with a past medically anxiety, depression, substance abuse, presents to the emergency department for agitation and requesting psychiatric evaluation.  Patient here states he has been under a lot of stressors and has lost some people recently but does not go into any further detail.  Patient does appear agitated at times.  Admits to using marijuana as well as alcohol today but denies any other substances.  When asked if the patient is having any thoughts of hurting himself he states "at this point I do not know what is going to happen."  Denies any medical complaints.  Patient is willing to take a medication to help him relax voluntarily.  Physical Exam   Triage Vital Signs: ED Triage Vitals  Enc Vitals Group     BP 07/21/22 2025 (!) 125/90     Pulse Rate 07/21/22 2025 80     Resp 07/21/22 2025 18     Temp 07/21/22 2025 98.1 F (36.7 C)     Temp src --      SpO2 07/21/22 2020 98 %     Weight 07/21/22 2026 130 lb (59 kg)     Height 07/21/22 2026 5\' 9"  (1.753 m)     Head Circumference --      Peak Flow --      Pain Score 07/21/22 2026 0     Pain Loc --      Pain Edu? --      Excl. in GC? --     Most recent vital signs: Vitals:   07/21/22 2020 07/21/22 2025  BP:  (!) 125/90  Pulse:  80  Resp:  18  Temp:  98.1 F (36.7 C)  SpO2: 98% 97%    General: Awake, no distress.  CV:  Good peripheral perfusion.  Regular rate and rhythm  Resp:  Normal effort.  Equal breath sounds bilaterally.  Abd:  No distention.     ED Results / Procedures / Treatments   MEDICATIONS ORDERED IN ED: Medications  ziprasidone (GEODON) injection 20 mg (has no administration in time range)     IMPRESSION / MDM / ASSESSMENT AND PLAN / ED COURSE  I reviewed the  triage vital signs and the nursing notes.  Patient's presentation is most consistent with acute presentation with potential threat to life or bodily function.  Patient presents emergency department for psychiatric evaluation agitation.  Patient does admit to marijuana and alcohol use earlier today.  Patient admits to depression and increased stressors, will not denies suicidal ideation.  Given the patient's possible suicidal ideation we will place the patient under an IVC until psychiatry can properly evaluate.  Patient is willing to take medication voluntarily we will dose IM Geodon to help with the patient's agitation.  Patient CBC is normal, chemistry is normal, remaining labs are pending.  FINAL CLINICAL IMPRESSION(S) / ED DIAGNOSES   Agitation Substance abuse  Note:  This document was prepared using Dragon voice recognition software and may include unintentional dictation errors.   Minna Antis, MD 07/21/22 2103

## 2022-07-21 NOTE — ED Notes (Signed)
Speaking with pt and pt repeatedly threatening other pts and requesting IM medication.

## 2022-07-21 NOTE — ED Triage Notes (Signed)
Arrives EMS from home requesting psychiatric services. Has multiple stressors at home and says he has "recently lost a few people".   Hesitant to answer questions and request EMS tell it.   Admits to drinking a pint of tequila prior to arrival.   Denies any SI/HI but verbalizes losing everything and having nothing to live for.

## 2022-07-21 NOTE — ED Notes (Signed)
Patient into BHU rm 3, oriented to unit regarding rounding and cameras, patient verablized understanding.

## 2022-07-21 NOTE — ED Notes (Signed)
TTS attempted to talk with patient, but patient did not wake up.

## 2022-07-21 NOTE — BH Assessment (Signed)
TTS attempted to evaluate pt. However, TTS was unable to assess due to pt being sleep. Pt was give agitation medication; psych team to follow up.

## 2022-07-22 DIAGNOSIS — F332 Major depressive disorder, recurrent severe without psychotic features: Secondary | ICD-10-CM

## 2022-07-22 MED ORDER — TRAZODONE HCL 100 MG PO TABS: 100.0000 mg | ORAL_TABLET | Freq: Every day | ORAL | 0 refills | Status: DC

## 2022-07-22 MED ORDER — LORAZEPAM 1 MG PO TABS
1.0000 mg | ORAL_TABLET | Freq: Once | ORAL | Status: AC
  Administered 2022-07-22: 1 mg via ORAL
  Filled 2022-07-22: qty 1

## 2022-07-22 NOTE — ED Provider Notes (Signed)
Emergency Medicine Observation Re-evaluation Note  Parker Wilson. is a 47 y.o. male, seen on rounds today.  Pt initially presented to the ED for complaints of Psychiatric Evaluation Currently, the patient is awaiting disposition.  Physical Exam  BP 130/85 (BP Location: Right Arm)   Pulse 77   Temp 98.1 F (36.7 C) (Oral)   Resp 18   Ht 5\' 9"  (1.753 m)   Wt 59 kg   SpO2 99%   BMI 19.20 kg/m  Physical Exam General: calm  ED Course / MDM  EKG:   I have reviewed the labs performed in the past 24 hours.  Plan  Awaiting disposition.    Phineas Semen, MD 07/22/22 [phone]

## 2022-07-22 NOTE — Consult Note (Signed)
Parker Wilson is a 47 year old white male who presents to the emergency room on a voluntary basis looking to get help for for depression and sleep.  He stopped using opioids about 2 months ago.  Says he is having trouble sleeping at night.  He became very agitated in the emergency room last night and they involuntarily committed him.  Today he is very pleasant and cooperative.  He states that he just cannot sleep and he did go to [facility] but they said that he needed to go to groups but that he could not work and go to groups and would like to just have a provider.  I talked to him about starting trazodone before either returning to [facility] or pursuing [facility].  He denies being suicidal.  He just gets very irritated and agitated when people ask him questions when he is tired.  He says that the trazodone does help him sleep.  Mental status exam: He is pleasant cooperative and good eye contact.  Speech is conversational and not pressured.  Mood and affect are depressed and irritable and congruent.  Thought process is goal directed.  Thought content he denies suicidal ideation, homicidal ideation, auditory or visual hallucinations.  Judgment and insight are good.  Assessment: Major depressive disorder, recurrent, severe without psychosis.  Plan: Discharge home with a prescription of trazodone 100 mg #30 with no refills.  Follow-up at [facility] or [facility].  He is going to pursue both depending on whether he has to do groups or not.

## 2022-07-22 NOTE — ED Notes (Signed)
Shaleta in TTS just informed me that the Behavioral health doctor will come and assess thi patient today. Patient notified of the plan.

## 2022-07-22 NOTE — ED Notes (Signed)
Spoke with Shaleta in TTS, TTS to come around and do assessment on pt. MD Derrill Kay notified of patients agitation. Ativan ordered for patient.

## 2022-07-22 NOTE — ED Notes (Signed)
Pt given his clothing to change into for discharge. Patients belonging bag documented 2/2 bags. Only one bag located. Contents of the located bag given to patient to change. Patients states that he only had one bag of belongings.

## 2022-07-22 NOTE — ED Notes (Signed)
Patient agitated, wanting to know what the plan is for him. Pt states that he hasn't seen anyone yet. Pt is requesting something for his nerves.

## 2022-07-22 NOTE — Discharge Instructions (Addendum)
Please seek medical attention and help for any thoughts about wanting to harm yourself, harm others, any concerning change in behavior, severe depression, inappropriate drug use or any other new or concerning symptoms. ° °

## 2022-07-22 NOTE — ED Notes (Signed)
IVC/pending psych consult 

## 2022-07-23 ENCOUNTER — Emergency Department
Admission: EM | Admit: 2022-07-23 | Discharge: 2022-07-23 | Disposition: A | Discharge status: Home / Self Care | Payer: No Typology Code available for payment source | Attending: Emergency Medicine | Admitting: Emergency Medicine

## 2022-07-23 DIAGNOSIS — F1094 Alcohol use, unspecified with alcohol-induced mood disorder: Secondary | ICD-10-CM | POA: Insufficient documentation

## 2022-07-23 DIAGNOSIS — G4700 Insomnia, unspecified: Secondary | ICD-10-CM | POA: Diagnosis not present

## 2022-07-23 LAB — ETHANOL: Alcohol, Ethyl (B): 262 mg/dL — ABNORMAL HIGH (ref <10)

## 2022-07-23 LAB — COMPREHENSIVE METABOLIC PANEL
ALT: 18 U/L (ref 0–44)
AST: 27 U/L (ref 15–41)
Albumin: 4.6 g/dL (ref 3.5–5.0)
Alkaline Phosphatase: 70 U/L (ref 38–126)
Anion gap: 12 (ref 5–15)
BUN: 9 mg/dL (ref 6–20)
CO2: 22 mmol/L (ref 22–32)
Calcium: 8.7 mg/dL — ABNORMAL LOW (ref 8.9–10.3)
Chloride: 102 mmol/L (ref 98–111)
Creatinine, Ser: 0.81 mg/dL (ref 0.61–1.24)
GFR, Estimated: >60 mL/min (ref >60)
Glucose, Bld: 100 mg/dL — ABNORMAL HIGH (ref 70–99)
Potassium: 3.7 mmol/L (ref 3.5–5.1)
Sodium: 136 mmol/L (ref 135–145)
Total Bilirubin: 0.7 mg/dL (ref 0.3–1.2)
Total Protein: 7.4 g/dL (ref 6.5–8.1)

## 2022-07-23 LAB — CBC
HCT: 42.7 % (ref 39.0–52.0)
Hemoglobin: 14.4 g/dL (ref 13.0–17.0)
MCH: 32.5 pg (ref 26.0–34.0)
MCHC: 33.7 g/dL (ref 30.0–36.0)
MCV: 96.4 fL (ref 80.0–100.0)
Platelets: 350 10*3/uL (ref 150–400)
RBC: 4.43 MIL/uL (ref 4.22–5.81)
RDW: 13.8 % (ref 11.5–15.5)
WBC: 10.1 10*3/uL (ref 4.0–10.5)
nRBC: 0 % (ref 0.0–0.2)

## 2022-07-23 LAB — ACETAMINOPHEN LEVEL: Acetaminophen (Tylenol), Serum: <10 ug/mL — ABNORMAL LOW (ref 10–30)

## 2022-07-23 LAB — URINE DRUG SCREEN, QUALITATIVE (ARMC ONLY)
Amphetamines, Ur Screen: NOT DETECTED
Barbiturates, Ur Screen: NOT DETECTED
Benzodiazepine, Ur Scrn: POSITIVE — AB
Cannabinoid 50 Ng, Ur ~~LOC~~: POSITIVE — AB
Cocaine Metabolite,Ur ~~LOC~~: NOT DETECTED
MDMA (Ecstasy)Ur Screen: NOT DETECTED
Methadone Scn, Ur: NOT DETECTED
Opiate, Ur Screen: NOT DETECTED
Phencyclidine (PCP) Ur S: NOT DETECTED
Tricyclic, Ur Screen: NOT DETECTED

## 2022-07-23 LAB — SALICYLATE LEVEL: Salicylate Lvl: <7 mg/dL — ABNORMAL LOW (ref 7.0–30.0)

## 2022-07-23 MED ORDER — ZIPRASIDONE MESYLATE 20 MG IM SOLR
20.0000 mg | Freq: Once | INTRAMUSCULAR | Status: AC
  Administered 2022-07-23: 20 mg via INTRAMUSCULAR
  Filled 2022-07-23: qty 20

## 2022-07-23 MED ORDER — LORAZEPAM 1 MG PO TABS
1.0000 mg | ORAL_TABLET | Freq: Once | ORAL | Status: AC
  Administered 2022-07-23: 1 mg via ORAL
  Filled 2022-07-23: qty 1

## 2022-07-23 MED ORDER — NICOTINE 7 MG/24HR TD PT24
7.0000 mg | MEDICATED_PATCH | Freq: Once | TRANSDERMAL | Status: DC
  Administered 2022-07-23: 7 mg via TRANSDERMAL
  Filled 2022-07-23: qty 1

## 2022-07-23 NOTE — BH Assessment (Signed)
This Clinical research associate contacted [employer] NP Beverly Milch and NP Sindy Guadeloupe regarding this patient. NP Welford Roche offered patient detox treatment at [facility]. This Clinical research associate spoke with the patient regarding the suggestion of detox but the patient declined. NP [place] recommended following up with Outpatient treatment as an alternative, this was communicated to the patient and the patient is receptive to following up on his own with outpatient.

## 2022-07-23 NOTE — BH Assessment (Signed)
Comprehensive Clinical Assessment (CCA) Note  07/23/2022 Parker Wilson [id number]  Chief Complaint: Patient is a 47 year old male presenting to [facility] ED voluntarily. Per triage note Per father pt was seen here yesterday and was discharged. Per father pt has been having a lot of problems with sleeping and very angry. Pt was sent home with trazadone to help him sleep and that did not work. Per pt he has been treating his anger and feelings with alcohol as well as trying to get to sleep. Pt sts that he has been wanting to hit and or kill many ppl. Pt sts that he has been off of fentanyl for the last couple of months. During assessment patient appears alert and oriented x4, calm and cooperative. Patient reports he is presenting due to "I'm just not sleeping." Patient presented to this ED yesterday and was psyc cleared and prescribed Trazadone for his sleep, he reports "it didn't work." When asked about following up with Outpatient he reports that he has not followed up, per chart review patient is not interested in group therapy that is recommended by [facility]. Patient reports alcohol use today "a couple of beers." Patient's BAL is 262. Patient denies SI/HI/AH/VH Chief Complaint  Patient presents with   Psychiatric Evaluation   Visit Diagnosis: Alcohol Intoxication, Substance-induced mood disorder    CCA Screening, Triage and Referral (STR)  Patient Reported Information How did you hear about [place]? Self  Referral name: No data recorded Referral phone number: No data recorded  Whom do you see for routine medical problems? No data recorded Practice/Facility Name: No data recorded Practice/Facility Phone Number: No data recorded Name of Contact: No data recorded Contact Number: No data recorded Contact Fax Number: No data recorded Prescriber Name: No data recorded Prescriber Address (if known): No data recorded  What Is the Reason for Your Visit/Call Today? Per father pt was seen here yesterday and  was discharged. Per father pt has been having a lot of problems with sleeping and very angry. Pt was sent home with trazadone to help him sleep and that did not work. Per pt he has been treating his anger and feelings with alcohol as well as trying to get to sleep. Pt sts that he has been wanting to hit and or kill many ppl. Pt sts that he has been off of fentanyl for the last couple of months.  How Long Has This Been Causing You Problems? > than 6 months  What Do You Feel Would Help You the Most Today? Treatment for Depression or other mood problem   Have You Recently Been in Any Inpatient Treatment (Hospital/Detox/Crisis Center/28-Day Program)? No data recorded Name/Location of Program/Hospital:No data recorded How Long Were You There? No data recorded When Were You Discharged? No data recorded  Have You Ever Received Services From [facility] Before? No data recorded Who Do You See at [facility]? No data recorded  Have You Recently Had Any Thoughts About Hurting Yourself? No  Are You Planning to Commit Suicide/Harm Yourself At This time? No   Have you Recently Had Thoughts About Hurting Someone Karolee Ohs? No  Explanation: No data recorded  Have You Used Any Alcohol or Drugs in the Past 24 Hours? Yes  How Long Ago Did You Use Drugs or Alcohol? No data recorded What Did You Use and How Much? Alcohol "a couple of beers"   Do You Currently Have a Therapist/Psychiatrist? No  Name of Therapist/Psychiatrist: No data recorded  Have You Been Recently  Discharged From Any Office Practice or Programs? No  Explanation of Discharge From Practice/Program: No data recorded    CCA Screening Triage Referral Assessment Type of Contact: Face-to-Face  Is this Initial or Reassessment? No data recorded Date Telepsych consult ordered in [facility]:  No data recorded Time Telepsych consult ordered in [facility]:  No data recorded  Patient Reported Information Reviewed? No data recorded Patient Left Without  Being Seen? No data recorded Reason for Not Completing Assessment: No data recorded  Collateral Involvement: No data recorded  Does Patient Have a Court Appointed Legal Guardian? No data recorded Name and Contact of Legal Guardian: No data recorded If Minor and Not Living with Parent(s), Who has Custody? No data recorded Is CPS involved or ever been involved? Never  Is APS involved or ever been involved? Never   Patient Determined To Be At Risk for Harm To Self or Others Based on Review of Patient Reported Information or Presenting Complaint? No  Method: No data recorded Availability of Means: No data recorded Intent: No data recorded Notification Required: No data recorded Additional Information for Danger to Others Potential: No data recorded Additional Comments for Danger to Others Potential: No data recorded Are There Guns or Other Weapons in Your Home? No  Types of Guns/Weapons: No data recorded Are These Weapons Safely Secured?                            No data recorded Who Could Verify You Are Able To Have These Secured: No data recorded Do You Have any Outstanding Charges, Pending Court Dates, Parole/Probation? No data recorded Contacted To Inform of Risk of Harm To Self or Others: No data recorded  Location of Assessment: [facility] ED   Does Patient Present under Involuntary Commitment? No  IVC Papers Initial File Date: No data recorded  [place] of Residence: [place]   Patient Currently Receiving the Following Services: No data recorded  Determination of Need: Emergent (2 hours)   Options For Referral: Medication Management     CCA Biopsychosocial Intake/Chief Complaint:  No data recorded Current Symptoms/Problems: No data recorded  Patient Reported Schizophrenia/Schizoaffective Diagnosis in Past: No   Strengths: Patient is able to communicate his needs  Preferences: No data recorded Abilities: No data recorded  Type of Services Patient Feels are  Needed: No data recorded  Initial Clinical Notes/Concerns: No data recorded  Mental Health Symptoms Depression:   Fatigue   Duration of Depressive symptoms:  Greater than two weeks   Mania:   None   Anxiety:    Fatigue; Irritability; Sleep   Psychosis:   None   Duration of Psychotic symptoms: No data recorded  Trauma:   None   Obsessions:   None   Compulsions:   None   Inattention:   None   Hyperactivity/Impulsivity:   None   Oppositional/Defiant Behaviors:   None   Emotional Irregularity:   None   Other Mood/Personality Symptoms:  No data recorded   Mental Status Exam Appearance and self-care  Stature:   Average   Weight:   Average weight   Clothing:   Casual   Grooming:   Normal   Cosmetic use:   None   Posture/gait:   Normal   Motor activity:   Not Remarkable   Sensorium  Attention:   Normal   Concentration:   Normal   Orientation:   X5   Recall/memory:   Normal   Affect and Mood  Affect:   Appropriate   Mood:   Other (Comment)   Relating  Eye contact:   Normal   Facial expression:   Responsive   Attitude toward examiner:   Cooperative   Thought and Language  Speech flow:  Clear and Coherent   Thought content:   Appropriate to Mood and Circumstances   Preoccupation:   None   Hallucinations:   None   Organization:  No data recorded  [employer] of Knowledge:   Fair   Intelligence:   Average   Abstraction:   Normal   Judgement:   Fair   Dance movement psychotherapist:   Adequate   Insight:   Fair   Decision Making:   Normal   Social Functioning  Social Maturity:   Responsible   Social Judgement:   Normal   Stress  Stressors:   Surveyor, quantity; Housing   Coping Ability:   Normal   Skill Deficits:   None   Supports:   Family     Religion: Religion/Spirituality Are You A Religious Person?: No  Leisure/Recreation: Leisure / Recreation Do You Have Hobbies?:  No  Exercise/Diet: Exercise/Diet Do You Exercise?: No Have You Gained or Lost A Significant Amount of Weight in the Past Six Months?: No Do You Follow a Special Diet?: No Do You Have Any Trouble Sleeping?: Yes Explanation of Sleeping Difficulties: Patient reports poor sleep   CCA Employment/Education Employment/Work Situation: Employment / Work Situation Employment Situation: Employed Work Stressors: None reported Patient's Job has Been Impacted by Current Illness: No Has Patient ever Been in Equities trader?: No  Education: Education Is Patient Currently Attending School?: No Did You Have An Individualized Education Program (IIEP): No Did You Have Any Difficulty At [employer]?: No Patient's Education Has Been Impacted by Current Illness: No   CCA Family/Childhood History Family and Relationship History: Family history Marital status: Single Does patient have children?: No  Childhood History:  Childhood History Did patient suffer any verbal/emotional/physical/sexual abuse as a child?: No Did patient suffer from severe childhood neglect?: No Has patient ever been sexually abused/assaulted/raped as an adolescent or adult?: No Was the patient ever a victim of a crime or a disaster?: No Witnessed domestic violence?: No Has patient been affected by domestic violence as an adult?: No  Child/Adolescent Assessment:     CCA Substance Use Alcohol/Drug Use: Alcohol / Drug Use Pain Medications: see mar Prescriptions: see mar Over the Counter: see mar History of alcohol / drug use?: Yes Substance #1 Name of Substance 1: Alcohol 1 - Age of First Use: Unknown age 83 - Amount (size/oz): "couple of beers" 1 - Frequency: daily 1 - Last Use / Amount: 07/23/22 1 - Method of Aquiring: Purchasing 1- Route of Use: Oral                       ASAM's:  Six Dimensions of Multidimensional Assessment  Dimension 1:  Acute Intoxication and/or Withdrawal Potential:      Dimension  2:  Biomedical Conditions and Complications:      Dimension 3:  Emotional, Behavioral, or Cognitive Conditions and Complications:     Dimension 4:  Readiness to Change:     Dimension 5:  Relapse, Continued use, or Continued Problem Potential:     Dimension 6:  Recovery/Living Environment:     ASAM Severity Score:    ASAM Recommended Level of Treatment:     Substance use Disorder (SUD) Substance Use Disorder (SUD)  Checklist Symptoms of  Substance Use: Continued use despite having a persistent/recurrent physical/psychological problem caused/exacerbated by use, Evidence of tolerance, Presence of craving or strong urge to use, Social, occupational, recreational activities given up or reduced due to use, Recurrent use that results in a failure to fulfill major role obligations (work, school, home), Large amounts of time spent to obtain, use or recover from the substance(s), Substance(s) often taken in larger amounts or over longer times than was intended, Persistent desire or unsuccessful efforts to cut down or control use, Continued use despite persistent or recurrent social, interpersonal problems, caused or exacerbated by use  Recommendations for Services/Supports/Treatments: Recommendations for Services/Supports/Treatments Recommendations For Services/Supports/Treatments: CD-IOP Intensive Chemical Dependency Program, IOP (Intensive Outpatient Program), SAIOP (Substance Abuse Intensive Outpatient Program)  DSM5 Diagnoses: Patient Active Problem List   Diagnosis Date Noted   Alcohol use 06/21/2022   Alcohol abuse with intoxication (HCC) 06/21/2022   History of substance abuse (HCC) 03/24/2021   History of suicidal ideation 04/12/2020   MDD (major depressive disorder), recurrent episode, severe (HCC) 03/31/2020   Annual physical exam 10/01/2018   Abnormal MRI, lumbar spine 01/28/2018   Lumbar herniated disc 01/28/2018   Cervicalgia 01/08/2018   Depression, recurrent (HCC) 10/08/2017    Chronic insomnia 10/08/2017   Chronic low back pain 10/08/2017   Tobacco abuse 02/26/2012   Headache, chronic daily 02/26/2012   Fatigue 02/26/2012   Anxiety 02/26/2012    Patient Centered Plan: Patient is on the following Treatment Plan(s):  Substance Abuse   Referrals to Alternative Service(s): Referred to Alternative Service(s):   Place:   Date:   Time:    Referred to Alternative Service(s):   Place:   Date:   Time:    Referred to Alternative Service(s):   Place:   Date:   Time:    Referred to Alternative Service(s):   Place:   Date:   Time:      @BHCOLLABOFCARE @  [employer], LCAS-A

## 2022-07-23 NOTE — ED Triage Notes (Addendum)
Per father pt was seen here yesterday and was discharged. Per father pt has been having a lot of problems with sleeping and very angry. Pt was sent home with trazadone to help him sleep and that did not work. Per pt he has been treating his anger and feelings with alcohol as well as trying to get to sleep. Pt sts that he has been wanting to hit and or kill many ppl. Pt sts that he has been off of fentanyl for the last couple of months.

## 2022-07-23 NOTE — ED Provider Notes (Signed)
[facility] Provider Note    Event Date/Time   First MD Initiated Contact with Patient 07/23/22 1739     (approximate)   History   Psychiatric Evaluation   HPI  Parker Wilson. is a 47 y.o. male with history of anxiety, depression, substance use presenting to the emergency department for evaluation of insomnia.  Reports he has only been getting about 2 hours of sleep a night for the past several nights.  He was actually seen in our ER on 5/18.  He reports he was sent home with trazodone without is not working.  He says that this is not effective so he has been drinking alcohol to try and help.  In triage she reports that he has been wanting to "hit and or kill many people".  He denies SI or HI to me though reported he wanted to him people in triage.    Physical Exam   Triage Vital Signs: ED Triage Vitals  Enc Vitals Group     BP 07/23/22 1403 (!) 136/96     Pulse Rate 07/23/22 1403 71     Resp 07/23/22 1403 17     Temp 07/23/22 1403 98 F (36.7 C)     Temp Source 07/23/22 1403 Oral     SpO2 07/23/22 1403 98 %     Weight 07/23/22 1404 129 lb 13.6 oz (58.9 kg)     Height --      Head Circumference --      Peak Flow --      Pain Score 07/23/22 1404 3     Pain Loc --      Pain Edu? --      Excl. in GC? --     Most recent vital signs: Vitals:   07/23/22 1403  BP: (!) 136/96  Pulse: 71  Resp: 17  Temp: 98 F (36.7 C)  SpO2: 98%     General: Awake, interactive  CV:  Regular rate Resp:  Lungs clear, unlabored respirations.  Abd:  Nondistended Neuro:  Symmetric facial movement, fluid speech   ED Results / Procedures / Treatments   Labs (all labs ordered are listed, but only abnormal results are displayed) Labs Reviewed  COMPREHENSIVE METABOLIC PANEL - Abnormal; Notable for the following components:      Result Value   Glucose, Bld 100 (*)    Calcium 8.7 (*)    All other components within normal limits  ETHANOL - Abnormal; Notable  for the following components:   Alcohol, Ethyl (B) 262 (*)    All other components within normal limits  SALICYLATE LEVEL - Abnormal; Notable for the following components:   Salicylate Lvl <7.0 (*)    All other components within normal limits  ACETAMINOPHEN LEVEL - Abnormal; Notable for the following components:   Acetaminophen (Tylenol), Serum <10 (*)    All other components within normal limits  URINE DRUG SCREEN, QUALITATIVE ([facility] ONLY) - Abnormal; Notable for the following components:   Cannabinoid 50 Ng, Ur  POSITIVE (*)    Benzodiazepine, Ur Scrn POSITIVE (*)    All other components within normal limits  CBC     EKG EKG independently reviewed interpreted by myself (ER attending) demonstrates:    RADIOLOGY Imaging independently reviewed and interpreted by myself demonstrates:    PROCEDURES:  Critical Care performed: No  Procedures   MEDICATIONS ORDERED IN ED: Medications  nicotine (NICODERM CQ - dosed in mg/24 hr) patch 7 mg (7 mg Transdermal  Patch Applied 07/23/22 2100)  LORazepam (ATIVAN) tablet 1 mg (1 mg Oral Given 07/23/22 1457)  ziprasidone (GEODON) injection 20 mg (20 mg Intramuscular Given 07/23/22 1546)     IMPRESSION / MDM / ASSESSMENT AND PLAN / ED COURSE  I reviewed the triage vital signs and the nursing notes.  Differential diagnosis includes, but is not limited to, substance-induced mood disorder, exacerbation of chronic underlying psychiatric issues  Patient's presentation is most consistent with acute presentation with potential threat to life or bodily function.  47 year old male presenting with insomnia currently denying SI or HI.  Medical workup notes elevated alcohol at 262, UDS positive cannabinoids and benzos, otherwise reassuring.  Do not feel he needs further medical workup at this time.  Will consult behavioral health to evaluate further.  Patient was evaluated by behavioral health team.  The case was discussed with the [facility]  behavioral health providers.  They did offer detox for the patient, but patient declined.  Patient reported that he wished to follow-up as an outpatient.  As contacted by TTS team with recommendation for discharge.  Patient continues to deny SI or HI.  Do not think he is appropriate for an involuntary commitment.  He is wishing to be discharged home.  Do think this is reasonable.  He will arrange follow-up as an outpatient for further evaluation.     FINAL CLINICAL IMPRESSION(S) / ED DIAGNOSES   Final diagnoses:  Insomnia, unspecified type     Rx / DC Orders   ED Discharge Orders     None        Note:  This document was prepared using Dragon voice recognition software and may include unintentional dictation errors.   Trinna Post, MD 07/24/22 [phone]

## 2022-07-23 NOTE — ED Provider Triage Note (Signed)
Emergency Medicine Provider Triage Evaluation Note  Parker Wilson., a 47 y.o. male  was evaluated in triage.  Pt complains of agitation, insomnia, and EtoH use. He was evaluated here on 5/18 and left with referral info for [facility]. He presents today, accompanied by his father, for psyche evaluation. He denies SI but reports he has thoughts of "hurting" some other people.   Review of Systems  Positive: Anxiety, agitation, insomnia, EtoH Negative: SI  Physical Exam  BP (!) 136/96 (BP Location: Left Arm)   Pulse 71   Temp 98 F (36.7 C) (Oral)   Resp 17   Wt 58.9 kg   SpO2 98%   BMI 19.18 kg/m  Gen:   Awake, no distress  anxious Resp:  Normal effort CTA MSK:   Moves extremities without difficulty  Other:    Medical Decision Making  Medically screening exam initiated at 2:45 PM.  Appropriate orders placed.  Parker Wilson. was informed that the remainder of the evaluation will be completed by another provider, this initial triage assessment does not replace that evaluation, and the importance of remaining in the ED until their evaluation is complete.  Patient to  the ED for evaluation of anxiety, insomnia, and EtOH use. He presents voluntarily at this time.    Lissa Hoard, PA-C 07/23/22 1450

## 2022-07-23 NOTE — ED Notes (Signed)
VOL/  PENDING  CONSULT 

## 2022-07-23 NOTE — ED Notes (Signed)
Pt offered snack, but only accepted water.

## 2022-07-23 NOTE — Discharge Instructions (Signed)
You were seen in the emergency room today for evaluation of your insomnia.  You can take your previously prescribed medications as directed.  You were offered detox, but declined this.  Please follow-up as an outpatient for further evaluation.  Return to the ER for new or worsening symptoms.

## 2022-07-31 ENCOUNTER — Other Ambulatory Visit: Payer: Self-pay

## 2022-07-31 ENCOUNTER — Encounter (HOSPITAL_COMMUNITY): Payer: Self-pay | Admitting: Behavioral Health

## 2022-07-31 ENCOUNTER — Encounter (HOSPITAL_COMMUNITY): Payer: Self-pay | Admitting: Registered Nurse

## 2022-07-31 ENCOUNTER — Inpatient Hospital Stay (HOSPITAL_COMMUNITY)
Admission: AD | Admit: 2022-07-31 | Discharge: 2022-08-04 | DRG: 885 | Disposition: A | Discharge status: Home / Self Care | Payer: No Typology Code available for payment source | Source: Intra-hospital | Attending: Psychiatry | Admitting: Psychiatry

## 2022-07-31 ENCOUNTER — Ambulatory Visit (HOSPITAL_COMMUNITY)
Admission: EM | Admit: 2022-07-31 | Discharge: 2022-07-31 | Disposition: A | Discharge status: Other Acute Inpatient Hospital | Payer: No Typology Code available for payment source | Attending: Behavioral Health | Admitting: Behavioral Health

## 2022-07-31 DIAGNOSIS — K5900 Constipation, unspecified: Secondary | ICD-10-CM | POA: Diagnosis present

## 2022-07-31 DIAGNOSIS — F333 Major depressive disorder, recurrent, severe with psychotic symptoms: Secondary | ICD-10-CM | POA: Diagnosis present

## 2022-07-31 DIAGNOSIS — Z9151 Personal history of suicidal behavior: Secondary | ICD-10-CM | POA: Insufficient documentation

## 2022-07-31 DIAGNOSIS — R45851 Suicidal ideations: Secondary | ICD-10-CM | POA: Diagnosis present

## 2022-07-31 DIAGNOSIS — F1910 Other psychoactive substance abuse, uncomplicated: Secondary | ICD-10-CM

## 2022-07-31 DIAGNOSIS — G4700 Insomnia, unspecified: Secondary | ICD-10-CM | POA: Diagnosis present

## 2022-07-31 DIAGNOSIS — Z818 Family history of other mental and behavioral disorders: Secondary | ICD-10-CM

## 2022-07-31 DIAGNOSIS — F1010 Alcohol abuse, uncomplicated: Secondary | ICD-10-CM | POA: Diagnosis present

## 2022-07-31 DIAGNOSIS — R45850 Homicidal ideations: Secondary | ICD-10-CM | POA: Insufficient documentation

## 2022-07-31 DIAGNOSIS — F10129 Alcohol abuse with intoxication, unspecified: Secondary | ICD-10-CM | POA: Diagnosis present

## 2022-07-31 DIAGNOSIS — K30 Functional dyspepsia: Secondary | ICD-10-CM | POA: Diagnosis present

## 2022-07-31 DIAGNOSIS — F410 Panic disorder [episodic paroxysmal anxiety] without agoraphobia: Secondary | ICD-10-CM | POA: Diagnosis present

## 2022-07-31 DIAGNOSIS — F332 Major depressive disorder, recurrent severe without psychotic features: Secondary | ICD-10-CM | POA: Diagnosis present

## 2022-07-31 DIAGNOSIS — F1320 Sedative, hypnotic or anxiolytic dependence, uncomplicated: Secondary | ICD-10-CM | POA: Insufficient documentation

## 2022-07-31 DIAGNOSIS — F1210 Cannabis abuse, uncomplicated: Secondary | ICD-10-CM | POA: Diagnosis present

## 2022-07-31 DIAGNOSIS — F1290 Cannabis use, unspecified, uncomplicated: Secondary | ICD-10-CM | POA: Insufficient documentation

## 2022-07-31 DIAGNOSIS — Y904 Blood alcohol level of 80-99 mg/100 ml: Secondary | ICD-10-CM | POA: Diagnosis present

## 2022-07-31 DIAGNOSIS — F1994 Other psychoactive substance use, unspecified with psychoactive substance-induced mood disorder: Secondary | ICD-10-CM | POA: Diagnosis not present

## 2022-07-31 DIAGNOSIS — F17210 Nicotine dependence, cigarettes, uncomplicated: Secondary | ICD-10-CM | POA: Diagnosis present

## 2022-07-31 DIAGNOSIS — F419 Anxiety disorder, unspecified: Secondary | ICD-10-CM | POA: Diagnosis present

## 2022-07-31 DIAGNOSIS — F1310 Sedative, hypnotic or anxiolytic abuse, uncomplicated: Secondary | ICD-10-CM | POA: Diagnosis present

## 2022-07-31 LAB — COMPREHENSIVE METABOLIC PANEL
ALT: 19 U/L (ref 0–44)
AST: 27 U/L (ref 15–41)
Albumin: 4.5 g/dL (ref 3.5–5.0)
Alkaline Phosphatase: 60 U/L (ref 38–126)
Anion gap: 13 (ref 5–15)
BUN: 7 mg/dL (ref 6–20)
CO2: 26 mmol/L (ref 22–32)
Calcium: 9 mg/dL (ref 8.9–10.3)
Chloride: 97 mmol/L — ABNORMAL LOW (ref 98–111)
Creatinine, Ser: 0.86 mg/dL (ref 0.61–1.24)
GFR, Estimated: >60 mL/min (ref >60)
Glucose, Bld: 72 mg/dL (ref 70–99)
Potassium: 4 mmol/L (ref 3.5–5.1)
Sodium: 136 mmol/L (ref 135–145)
Total Bilirubin: 0.4 mg/dL (ref 0.3–1.2)
Total Protein: 7.6 g/dL (ref 6.5–8.1)

## 2022-07-31 LAB — CBC WITH DIFFERENTIAL/PLATELET
Abs Immature Granulocytes: 0.03 10*3/uL (ref 0.00–0.07)
Basophils Absolute: 0.1 10*3/uL (ref 0.0–0.1)
Basophils Relative: 1 %
Eosinophils Absolute: 0.6 10*3/uL — ABNORMAL HIGH (ref 0.0–0.5)
Eosinophils Relative: 6 %
HCT: 46.1 % (ref 39.0–52.0)
Hemoglobin: 15.3 g/dL (ref 13.0–17.0)
Immature Granulocytes: 0 %
Lymphocytes Relative: 31 %
Lymphs Abs: 2.9 10*3/uL (ref 0.7–4.0)
MCH: 32.1 pg (ref 26.0–34.0)
MCHC: 33.2 g/dL (ref 30.0–36.0)
MCV: 96.6 fL (ref 80.0–100.0)
Monocytes Absolute: 0.6 10*3/uL (ref 0.1–1.0)
Monocytes Relative: 6 %
Neutro Abs: 5.3 10*3/uL (ref 1.7–7.7)
Neutrophils Relative %: 56 %
Platelets: 332 10*3/uL (ref 150–400)
RBC: 4.77 MIL/uL (ref 4.22–5.81)
RDW: 14.1 % (ref 11.5–15.5)
WBC: 9.6 10*3/uL (ref 4.0–10.5)
nRBC: 0 % (ref 0.0–0.2)

## 2022-07-31 LAB — LIPID PANEL
Cholesterol: 203 mg/dL — ABNORMAL HIGH (ref 0–200)
HDL: 79 mg/dL (ref >40)
LDL Cholesterol: 97 mg/dL (ref 0–99)
Total CHOL/HDL Ratio: 2.6 RATIO
Triglycerides: 136 mg/dL (ref <150)
VLDL: 27 mg/dL (ref 0–40)

## 2022-07-31 LAB — POCT URINE DRUG SCREEN - MANUAL ENTRY (I-SCREEN)
POC Amphetamine UR: NOT DETECTED
POC Buprenorphine (BUP): NOT DETECTED
POC Cocaine UR: NOT DETECTED
POC Marijuana UR: POSITIVE — AB
POC Methadone UR: NOT DETECTED
POC Methamphetamine UR: NOT DETECTED
POC Morphine: NOT DETECTED
POC Oxazepam (BZO): POSITIVE — AB
POC Oxycodone UR: NOT DETECTED
POC Secobarbital (BAR): NOT DETECTED

## 2022-07-31 LAB — ETHANOL: Alcohol, Ethyl (B): 95 mg/dL — ABNORMAL HIGH (ref <10)

## 2022-07-31 LAB — TSH: TSH: 2.631 u[IU]/mL (ref 0.350–4.500)

## 2022-07-31 LAB — MAGNESIUM: Magnesium: 2.4 mg/dL (ref 1.7–2.4)

## 2022-07-31 MED ORDER — ONDANSETRON 4 MG PO TBDP: 4.0000 mg | ORAL_TABLET | Freq: Four times a day (QID) | ORAL | Status: DC | PRN

## 2022-07-31 MED ORDER — HYDROXYZINE HCL 25 MG PO TABS
25.0000 mg | ORAL_TABLET | Freq: Four times a day (QID) | ORAL | Status: DC | PRN
  Administered 2022-07-31 – 2022-08-03 (×5): 25 mg via ORAL
  Filled 2022-07-31 (×5): qty 1

## 2022-07-31 MED ORDER — TRAZODONE HCL 50 MG PO TABS: 50.0000 mg | ORAL_TABLET | Freq: Every evening | ORAL | Status: DC | PRN

## 2022-07-31 MED ORDER — ALUM & MAG HYDROXIDE-SIMETH 200-200-20 MG/5ML PO SUSP: 30.0000 mL | ORAL | Status: DC | PRN

## 2022-07-31 MED ORDER — LORAZEPAM 1 MG PO TABS: 1.0000 mg | ORAL_TABLET | Freq: Three times a day (TID) | ORAL | Status: DC

## 2022-07-31 MED ORDER — LOPERAMIDE HCL 2 MG PO CAPS: 2.0000 mg | ORAL_CAPSULE | ORAL | Status: DC | PRN

## 2022-07-31 MED ORDER — DIPHENHYDRAMINE HCL 25 MG PO CAPS: 50.0000 mg | ORAL_CAPSULE | Freq: Three times a day (TID) | ORAL | Status: DC | PRN

## 2022-07-31 MED ORDER — MAGNESIUM HYDROXIDE 400 MG/5ML PO SUSP: 30.0000 mL | Freq: Every day | ORAL | Status: DC | PRN

## 2022-07-31 MED ORDER — LORAZEPAM 1 MG PO TABS: 1.0000 mg | ORAL_TABLET | Freq: Two times a day (BID) | ORAL | Status: DC

## 2022-07-31 MED ORDER — LORAZEPAM 2 MG/ML IJ SOLN: 2.0000 mg | Freq: Three times a day (TID) | INTRAMUSCULAR | Status: DC | PRN

## 2022-07-31 MED ORDER — HYDROXYZINE HCL 25 MG PO TABS
25.0000 mg | ORAL_TABLET | Freq: Three times a day (TID) | ORAL | Status: DC | PRN
  Administered 2022-07-31: 25 mg via ORAL
  Filled 2022-07-31: qty 1

## 2022-07-31 MED ORDER — THIAMINE MONONITRATE 100 MG PO TABS: 100.0000 mg | ORAL_TABLET | Freq: Every day | ORAL | Status: DC

## 2022-07-31 MED ORDER — LORAZEPAM 1 MG PO TABS: 1.0000 mg | ORAL_TABLET | Freq: Four times a day (QID) | ORAL | Status: DC | PRN

## 2022-07-31 MED ORDER — LORAZEPAM 1 MG PO TABS
1.0000 mg | ORAL_TABLET | Freq: Three times a day (TID) | ORAL | Status: AC
  Administered 2022-08-02 – 2022-08-03 (×3): 1 mg via ORAL
  Filled 2022-07-31 (×3): qty 1

## 2022-07-31 MED ORDER — LORAZEPAM 1 MG PO TABS
1.0000 mg | ORAL_TABLET | Freq: Four times a day (QID) | ORAL | Status: AC
  Administered 2022-07-31 – 2022-08-02 (×6): 1 mg via ORAL
  Filled 2022-07-31 (×6): qty 1

## 2022-07-31 MED ORDER — LORAZEPAM 1 MG PO TABS: 2.0000 mg | ORAL_TABLET | Freq: Three times a day (TID) | ORAL | Status: DC | PRN

## 2022-07-31 MED ORDER — LOPERAMIDE HCL 2 MG PO CAPS: 2.0000 mg | ORAL_CAPSULE | ORAL | Status: AC | PRN

## 2022-07-31 MED ORDER — ONDANSETRON 4 MG PO TBDP: 4.0000 mg | ORAL_TABLET | Freq: Four times a day (QID) | ORAL | Status: AC | PRN

## 2022-07-31 MED ORDER — TRAZODONE HCL 100 MG PO TABS: 100.0000 mg | ORAL_TABLET | Freq: Every evening | ORAL | Status: DC | PRN

## 2022-07-31 MED ORDER — THIAMINE HCL 100 MG/ML IJ SOLN
100.0000 mg | Freq: Once | INTRAMUSCULAR | Status: DC
  Filled 2022-07-31: qty 2

## 2022-07-31 MED ORDER — LORAZEPAM 1 MG PO TABS
1.0000 mg | ORAL_TABLET | Freq: Two times a day (BID) | ORAL | Status: AC
  Administered 2022-08-03 – 2022-08-04 (×2): 1 mg via ORAL
  Filled 2022-07-31 (×2): qty 1

## 2022-07-31 MED ORDER — LORAZEPAM 1 MG PO TABS: 1.0000 mg | ORAL_TABLET | Freq: Every day | ORAL | Status: DC

## 2022-07-31 MED ORDER — ACETAMINOPHEN 325 MG PO TABS: 650.0000 mg | ORAL_TABLET | Freq: Four times a day (QID) | ORAL | Status: DC | PRN

## 2022-07-31 MED ORDER — DIPHENHYDRAMINE HCL 50 MG/ML IJ SOLN: 50.0000 mg | Freq: Three times a day (TID) | INTRAMUSCULAR | Status: DC | PRN

## 2022-07-31 MED ORDER — ADULT MULTIVITAMIN W/MINERALS CH
1.0000 | ORAL_TABLET | Freq: Every day | ORAL | Status: DC
  Administered 2022-08-01 – 2022-08-04 (×4): 1 via ORAL
  Filled 2022-07-31 (×6): qty 1

## 2022-07-31 MED ORDER — HALOPERIDOL LACTATE 5 MG/ML IJ SOLN: 5.0000 mg | Freq: Three times a day (TID) | INTRAMUSCULAR | Status: DC | PRN

## 2022-07-31 MED ORDER — VITAMIN B-1 100 MG PO TABS
100.0000 mg | ORAL_TABLET | Freq: Every day | ORAL | Status: DC
  Administered 2022-08-01 – 2022-08-04 (×4): 100 mg via ORAL
  Filled 2022-07-31 (×6): qty 1

## 2022-07-31 MED ORDER — ADULT MULTIVITAMIN W/MINERALS CH
1.0000 | ORAL_TABLET | Freq: Every day | ORAL | Status: DC
  Administered 2022-07-31: 1 via ORAL
  Filled 2022-07-31: qty 1

## 2022-07-31 MED ORDER — HYDROXYZINE HCL 25 MG PO TABS: 25.0000 mg | ORAL_TABLET | Freq: Four times a day (QID) | ORAL | Status: DC | PRN

## 2022-07-31 MED ORDER — LORAZEPAM 1 MG PO TABS
1.0000 mg | ORAL_TABLET | Freq: Four times a day (QID) | ORAL | Status: DC
  Administered 2022-07-31 (×2): 1 mg via ORAL
  Filled 2022-07-31 (×2): qty 1

## 2022-07-31 MED ORDER — LORAZEPAM 1 MG PO TABS: 1.0000 mg | ORAL_TABLET | Freq: Four times a day (QID) | ORAL | Status: AC | PRN

## 2022-07-31 MED ORDER — HALOPERIDOL 5 MG PO TABS: 5.0000 mg | ORAL_TABLET | Freq: Three times a day (TID) | ORAL | Status: DC | PRN

## 2022-07-31 NOTE — [facility]HH Group Notes (Signed)
Pt did not attend wrap-up group   

## 2022-07-31 NOTE — ED Notes (Signed)
Pt A&O x 4, awake & resting at present, anxious and irritable, Ativan 1 mg given po.  No distress noted.  Pt is IVC, pending report to [facility] rm 303-2 and transfer.

## 2022-07-31 NOTE — Progress Notes (Signed)
  ADMISSION DAR NOTE:   Patient presents from [facility] under Involuntary status. Alert and oriented x 4 Denies SI denies HI endorsing A/VH of his late girlfriend who died two years ago from fentanyl overdose. Patient able to contract for safety. Stated that " I am tired of beating myself up with the guilt of my girlfriends death she had an overdose while we were doing fentanyl. I finally stopped Fentanyl 3 months ago but I take Xanax or Oxycodone to be able to sleep and have been drinking a 12 pack of beer on a daily bases for about 3 months." Patient stated he needs help with sleep and to stop the feelings of guilt. He reports having panic attacks, anger, irritability and depression and anxiety. Not able to concentrate at work. 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  Denies any history of abuse, denies family history of Mental health. Smokes a pack of cigarette a day.   Emotional support and availability offered to Patient as needed. Skin assessment done noted a scar on left forearm and multiple tattoos all over his body. Belongings searched per protocol. Items deemed contraband secured in locker. Unit orientation and routine discussed, Care Plan reviewed as well and Patient verbalized understanding. Fluids and Food offered, tolerated well. Q15 minutes safety checks initiated without self harm gestures.

## 2022-07-31 NOTE — ED Notes (Signed)
Report called to RN Kuda, [facility] rm 303-2.  Pending transfer at 9:30pm.  Pt is IVC.

## 2022-07-31 NOTE — Tx Team (Signed)
Initial Treatment Plan 07/31/2022 11:20 PM [name] [id number]    PATIENT STRESSORS: Medication change or noncompliance   Substance abuse   Traumatic event     PATIENT STRENGTHS: Ability for insight  Financial means  [employer] fund of knowledge  Supportive family/friends    PATIENT IDENTIFIED PROBLEMS: "My girlfriend died of Fentanyl overdose"  "I am tired of beating myself up"  "Have been taking drug and alcohol"  "I am not able to sleep since I stopped Fentanyl"               DISCHARGE CRITERIA:  Motivation to continue treatment in a less acute level of care Need for constant or close observation no longer present Verbal commitment to aftercare and medication compliance  PRELIMINARY DISCHARGE PLAN: Attend 12-step recovery group Outpatient therapy Return to previous living arrangement  PATIENT/FAMILY INVOLVEMENT: This treatment plan has been presented to and reviewed with the patient, Parker Wilson., and/or family member.  The patient and family have been given the opportunity to ask questions and make suggestions.  Margarita Rana, RN 07/31/2022, 11:20 PM

## 2022-07-31 NOTE — ED Notes (Signed)
Patient tired to draw patients blood x2 was unsuccessful. Lpn was able to draw blood.

## 2022-07-31 NOTE — ED Notes (Signed)
Provider states ivc papers were ok.

## 2022-07-31 NOTE — ED Notes (Signed)
Notified provider that patient was crying and states that he need medication for aniexty.Patient states that he has panic attacks when he is gets anxious.

## 2022-07-31 NOTE — ED Provider Notes (Signed)
[facility]gent Care Continuous Assessment Admission H&P  Date: 07/31/22 Patient Name: Parker Wilson. MRN: [id number] Chief Complaint: "I can't do this every day"  Diagnoses:  Final diagnoses:  Homicidal ideations  Suicidal ideation  Severe episode of recurrent major depressive disorder, with psychotic features (HCC)  Substance abuse (HCC)    HPI: Parker Wilson. is a 47 y.o. male patient with a past psychiatric history of MDD, anxiety, insomnia, alcohol use, suicidal ideation, substance abuse, and grief who presented to [facility] voluntarily and accompanied by his father Kailyn Bergemann Sr.) for a walk-in assessment with complaints of suicidal ideation, homicidal ideation, and substance use. Patient requested for his father to not be present during the assessment.  Patient assessed face-to-face by this provider, consulted with Dr. Viviano Simas, and chart reviewed on 07/31/22. Counselor Beryle Flock present during assessment. On evaluation, Parker Wilson. is seated in assessment area in no acute distress.  Patient is alert and oriented x4, cooperative. Speech is clear and coherent, normal rate and volume. Eye contact is minimal. Patient smells of alcohol. Mood is depressed, irritable, and labile with tearful and congruent affect. Patient appears tense and easily agitated, clenching his fists during assessment and providing short or yes/no responses to assessment questions asked. Thought process is coherent with thought content that consists of suicidal ideation, homicidal ideation, and auditory and visual hallucinations. Patient endorses active suicidal ideations with a plan to stab himself in the throat with a knife. Patient states he has access to "knives and hatchets." Patient endorses passive homicidal ideation towards "anyone that wants to get hurt can get hurt." Patient unable to contract for safety at this time and states "I can't do this every day." Patient reports a history of 2 past suicide attempts by  overdosing on fentanyl but does not state when the last attempt occurred. When asked if patient engages in NSSIB such as cutting, hitting, or burning himself patient states "all of the above. I break things, start fights, punch holes in stuff." Patient reports past psychiatric hospitalizations with the last being at [facility] 2.5 years ago. Per chart review, patient has presented to [facility] ED 4 times since August 2023 for alcohol use, suicidal ideation, depression, and insomnia. Of note, patient presented to [facility] on 10/04/21 with a rash "which may be DRESS following olanzapine administration." Patient was discharged with a 7 day prescription for Topamax 50mg  BID and Kenalog 0.5% cream and a follow up appointment with Dr. Vanetta Shawl on 10/07/21. Patient endorses auditory and visual hallucinations of "just her," stating he sees and hears his deceased fiance. Patient states he experiences auditory and visual hallucinations "in broad daylight" and reports they have worsened since he stopped using fentanyl 2.5 months ago. When asked if patient experiences symptoms of paranoia such as feeling like someone is watching or following him, patient states "I wish they would." Patient is able to converse coherently with goal-directed thoughts and no distractibility or preoccupation. Objectively, there is no evidence of psychosis/mania, delusional thinking, or indication that patient is responding to internal or external stimuli.  Patient reports poor sleep (2 hours/night) and decreased appetite. Patient lives alone in [place] and denies access to firearms stating he is a felon. Patient is currently unemployed stating he cannot work because he is unable to sleep. Patient states he is not currently prescribed any psychotropic medication and does not have outpatient psychiatric services in place for therapy/medication management. Patient reports daily alcohol use of "as much as I can get," last use  today. Patient states he obtains Xanax  "off the street" and uses "as many as I can" daily because "I just want to forget," last use today. Patient states he was prescribed "4 bars a day for 10 years" of Xanax sometime between his early teens-20s. Patient states he was recently given Trazodone at the ED and "I eat about 3-4 a night but it doesn't help." Patient reports he last used fentanyl 2.5 months ago and currently has urges to use again. Patient denies use of other illicit substances. Patient shares that his fiance died from a fentanyl overdose 2.5 years ago and he feels guilty in some way for her death stating "I punish myself."   Patient gave verbal consent for provider to speak with his father in lobby. Father states patient "has been through a lot" and contacted him recently to get help "because he said he was going to hurt someone if he didn't." Father shares that patient has sought help before "but they always say they can't do anything for him." Father states patient becomes angry very easily. Father states when patient has been recommended for inpatient treatment in the past "he just gets mad and leaves, good luck."   Patient offered support and encouragement. Discussed recommendation for inpatient psychiatric treatment. Discussed admission to the continuous observation unit while awaiting inpatient bed placement. Patient is in agreement with plan of care.   Total Time spent with patient: 45 minutes  Musculoskeletal  Strength & Muscle Tone: within normal limits Gait & Station: normal Patient leans: N/A  Psychiatric Specialty Exam  Presentation General Appearance:  Appropriate for Environment; Casual  Eye Contact: Minimal  Speech: Clear and Coherent; Normal Rate  Speech Volume: Normal  Handedness: Right   Mood and Affect  Mood: Irritable; Depressed; Labile  Affect: Tearful; Congruent; Labile   Thought Process  Thought Processes: Coherent; Goal Directed  Descriptions of  Associations:Intact  Orientation:Full (Time, Place and Person)  Thought Content:Illogical  Diagnosis of Schizophrenia or Schizoaffective disorder in past: No  Duration of Psychotic Symptoms: Less than six months  Hallucinations:Hallucinations: Auditory; Visual Description of Auditory Hallucinations: Patient states he sees "just her" referring to his deceased fiance Description of Visual Hallucinations: Patient states he hears his deceased fiance's voice  Ideas of Reference:None  Suicidal Thoughts:Suicidal Thoughts: Yes, Active SI Active Intent and/or Plan: With Intent; With Plan; With Means to Carry Out; With Access to Means  Homicidal Thoughts:Homicidal Thoughts: Yes, Passive HI Passive Intent and/or Plan: Without Plan; Without Intent   Sensorium  Memory: Immediate Good; Recent Good; Remote Good  Judgment: Impaired  Insight: Poor   Executive Functions  Concentration: Good  Attention Span: Good  Recall: Good  Fund of Knowledge: Good  Language: Good   Psychomotor Activity  Psychomotor Activity: Psychomotor Activity: Normal   Assets  Assets: Desire for Improvement; Housing; Physical Health; Resilience; Social Support   Sleep  Sleep: Sleep: Poor Number of Hours of Sleep: 2   Nutritional Assessment (For OBS and FBC admissions only) Has the patient had a weight loss or gain of 10 pounds or more in the last 3 months?: No Has the patient had a decrease in food intake/or appetite?: No Does the patient have dental problems?: No Does the patient have eating habits or behaviors that may be indicators of an eating disorder including binging or inducing vomiting?: No Has the patient recently lost weight without trying?: 0 Has the patient been eating poorly because of a decreased appetite?: 0 Malnutrition Screening Tool Score: 0  Physical Exam Vitals and nursing note reviewed.  Constitutional:      General: He is not in acute distress.     Appearance: Normal appearance. He is not ill-appearing.  HENT:     Head: Normocephalic and atraumatic.     Nose: Nose normal.  Eyes:     General:        Right eye: No discharge.        Left eye: No discharge.     Conjunctiva/sclera: Conjunctivae normal.  Cardiovascular:     Rate and Rhythm: Normal rate.  Pulmonary:     Effort: Pulmonary effort is normal. No respiratory distress.  Musculoskeletal:        General: Normal range of motion.     Cervical back: Normal range of motion.  Skin:    General: Skin is warm and dry.  Neurological:     General: No focal deficit present.     Mental Status: He is alert and oriented to person, place, and time. Mental status is at baseline.  Psychiatric:        Attention and Perception: He perceives auditory and visual hallucinations.        Mood and Affect: Mood is depressed. Affect is labile and tearful.        Speech: Speech normal.        Behavior: Behavior is agitated. Behavior is cooperative.        Thought Content: Thought content is not paranoid or delusional. Thought content includes homicidal and suicidal ideation. Thought content includes suicidal plan. Thought content does not include homicidal plan.        Cognition and Memory: Cognition and memory normal.        Judgment: Judgment normal.    Review of Systems  Constitutional: Negative.   HENT: Negative.    Eyes: Negative.   Respiratory: Negative.    Cardiovascular: Negative.   Gastrointestinal: Negative.   Genitourinary: Negative.   Musculoskeletal: Negative.   Skin: Negative.   Neurological: Negative.   Endo/Heme/Allergies: Negative.   Psychiatric/Behavioral:  Positive for depression, hallucinations, substance abuse and suicidal ideas. Negative for memory loss. The patient has insomnia. The patient is not nervous/anxious.     Blood pressure 122/88, pulse 96, temperature 98.6 F (37 C), temperature source Oral, resp. rate 18, SpO2 98 %. There is no height or weight on file to  calculate BMI.  Past Psychiatric History: MDD, anxiety, insomnia, alcohol use, suicidal ideation, substance abuse, grief  Is the patient at risk to self? Yes  Has the patient been a risk to self in the past 6 months? Yes .    Has the patient been a risk to self within the distant past? Yes   Is the patient a risk to others? Yes   Has the patient been a risk to others in the past 6 months? Yes Has the patient been a risk to others within the distant past? Yes  Past Medical History:  Past Medical History:  Diagnosis Date   Anxiety    Chronic back pain    Chronic headaches    COVID-19    2021   Depression    Insomnia    Scoliosis    Substance abuse (HCC)    uds +cocaine, meth, thc and benzos and +etoh c/o uds 03/30/20   Vitamin D deficiency    Family History:  Family History  Problem Relation Age of Onset   Heart disease Father        cabg x 3  Hypertension Father    Arthritis Father    Depression Father    Hyperlipidemia Father    Crohn's disease Father    Hypertension Brother    Alcohol abuse Brother    Depression Brother    Hyperlipidemia Brother    Cancer Paternal Aunt        breast cancer   Cancer Paternal Grandfather        Lung cancer   Alcohol abuse Paternal Grandfather    Arthritis Paternal Grandfather    COPD Paternal Grandfather    Early death Paternal Grandfather    Heart disease Paternal Grandfather        cabg x 4    Hyperlipidemia Paternal Grandfather    Hypertension Paternal Grandfather    Other Paternal Grandfather        died covid 06-01-2019   Arthritis Mother        RA   Arthritis Paternal Grandmother    Depression Paternal Grandmother    Seizures Paternal Grandmother    Social History:  Social History   Tobacco Use   Smoking status: Every Day    Packs/day: 1.00    Years: 15.00    Additional pack years: 0.00    Total pack years: 15.00    Types: Cigarettes   Smokeless tobacco: Never   Tobacco comments:    x 12-13 years as of 10/08/17    Vaping Use   Vaping Use: Every day   Substances: Nicotine, THC, CBD  Substance Use Topics   Alcohol use: Yes    Alcohol/week: 6.0 standard drinks of alcohol    Types: 6 Cans of beer per week    Comment: 6   Drug use: Yes    Types: Marijuana    Comment: Occasional use approximately every 2-3 weeks   Last Labs:  Admission on 07/31/2022  Component Date Value Ref Range Status   WBC 07/31/2022 9.6  4.0 - 10.5 K/uL Final   RBC 07/31/2022 4.77  4.22 - 5.81 MIL/uL Final   Hemoglobin 07/31/2022 15.3  13.0 - 17.0 g/dL Final   HCT 19/14/7829 46.1  39.0 - 52.0 % Final   MCV 07/31/2022 96.6  80.0 - 100.0 fL Final   MCH 07/31/2022 32.1  26.0 - 34.0 pg Final   MCHC 07/31/2022 33.2  30.0 - 36.0 g/dL Final   RDW 56/21/3086 14.1  11.5 - 15.5 % Final   Platelets 07/31/2022 332  150 - 400 K/uL Final   nRBC 07/31/2022 0.0  0.0 - 0.2 % Final   Neutrophils Relative % 07/31/2022 56  % Final   Neutro Abs 07/31/2022 5.3  1.7 - 7.7 K/uL Final   Lymphocytes Relative 07/31/2022 31  % Final   Lymphs Abs 07/31/2022 2.9  0.7 - 4.0 K/uL Final   Monocytes Relative 07/31/2022 6  % Final   Monocytes Absolute 07/31/2022 0.6  0.1 - 1.0 K/uL Final   Eosinophils Relative 07/31/2022 6  % Final   Eosinophils Absolute 07/31/2022 0.6 (H)  0.0 - 0.5 K/uL Final   Basophils Relative 07/31/2022 1  % Final   Basophils Absolute 07/31/2022 0.1  0.0 - 0.1 K/uL Final   Immature Granulocytes 07/31/2022 0  % Final   Abs Immature Granulocytes 07/31/2022 0.03  0.00 - 0.07 K/uL Final   Performed at [facility], [address] [place], [place] [address]   Sodium 07/31/2022 136  135 - 145 mmol/L Final   Potassium 07/31/2022 4.0  3.5 - 5.1 mmol/L Final  Chloride 07/31/2022 97 (L)  98 - 111 mmol/L Final   CO2 07/31/2022 26  22 - 32 mmol/L Final   Glucose, Bld 07/31/2022 72  70 - 99 mg/dL Final   Glucose reference range applies only to samples taken after fasting for at least 8 hours.   BUN 07/31/2022 7  6 - 20 mg/dL Final    Creatinine, Ser 07/31/2022 0.86  0.61 - 1.24 mg/dL Final   Calcium 16/12/9602 9.0  8.9 - 10.3 mg/dL Final   Total Protein 54/11/8117 7.6  6.5 - 8.1 g/dL Final   Albumin 14/78/2956 4.5  3.5 - 5.0 g/dL Final   AST 21/30/8657 27  15 - 41 U/L Final   ALT 07/31/2022 19  0 - 44 U/L Final   Alkaline Phosphatase 07/31/2022 60  38 - 126 U/L Final   Total Bilirubin 07/31/2022 0.4  0.3 - 1.2 mg/dL Final   GFR, Estimated 07/31/2022 >60  >60 mL/min Final   Comment: (NOTE) Calculated using the CKD-EPI Creatinine Equation (2021)    Anion gap 07/31/2022 13  5 - 15 Final   Performed at [facility], [address], [place], [place] [address]   Magnesium 07/31/2022 2.4  1.7 - 2.4 mg/dL Final   Performed at [facility], [address] [place], [place], [place] [address]   Alcohol, Ethyl (B) 07/31/2022 95 (H)  <10 mg/dL Final   Comment: (NOTE) Lowest detectable limit for serum alcohol is 10 mg/dL.  For medical purposes only. Performed at [facility], [address], [place], [place] [address]    POC Amphetamine UR 07/31/2022 None Detected  NONE DETECTED (Cut Off Level 1000 ng/mL) Final   POC Secobarbital (BAR) 07/31/2022 None Detected  NONE DETECTED (Cut Off Level 300 ng/mL) Final   POC Buprenorphine (BUP) 07/31/2022 None Detected  NONE DETECTED (Cut Off Level 10 ng/mL) Final   POC Oxazepam (BZO) 07/31/2022 Positive (A)  NONE DETECTED (Cut Off Level 300 ng/mL) Final   POC Cocaine UR 07/31/2022 None Detected  NONE DETECTED (Cut Off Level 300 ng/mL) Final   POC Methamphetamine UR 07/31/2022 None Detected  NONE DETECTED (Cut Off Level 1000 ng/mL) Final   POC Morphine 07/31/2022 None Detected  NONE DETECTED (Cut Off Level 300 ng/mL) Final   POC Methadone UR 07/31/2022 None Detected  NONE DETECTED (Cut Off Level 300 ng/mL) Final   POC Oxycodone UR 07/31/2022 None Detected  NONE DETECTED (Cut Off Level 100 ng/mL) Final   POC Marijuana UR 07/31/2022 Positive (A)  NONE DETECTED (Cut Off Level 50  ng/mL) Final   Cholesterol 07/31/2022 203 (H)  0 - 200 mg/dL Final   Triglycerides 40/12/2723 136  <150 mg/dL Final   HDL 36/64/4034 79  >40 mg/dL Final   Total CHOL/HDL Ratio 07/31/2022 2.6  RATIO Final   VLDL 07/31/2022 27  0 - 40 mg/dL Final   LDL Cholesterol 07/31/2022 97  0 - 99 mg/dL Final   Comment:        Total Cholesterol/HDL:CHD Risk Coronary Heart Disease Risk Table                     Men   Women  1/2 Average Risk   3.4   3.3  Average Risk       5.0   4.4  2 X Average Risk   9.6   7.1  3 X Average Risk  23.4   11.0        Use the  calculated Patient Ratio above and the CHD Risk Table to determine the patient's CHD Risk.        ATP III CLASSIFICATION (LDL):  <100     mg/dL   Optimal  161-096  mg/dL   Near or Above                    Optimal  130-159  mg/dL   Borderline  045-409  mg/dL   High  >811     mg/dL   Very High Performed at [facility], [address], [place], [place] [address]    TSH 07/31/2022 2.631  0.350 - 4.500 uIU/mL Final   Comment: Performed by a 3rd Generation assay with a functional sensitivity of <=0.01 uIU/mL. Performed at [facility], [address], [place], [place] [address]   Admission on 07/23/2022, Discharged on 07/23/2022  Component Date Value Ref Range Status   Sodium 07/23/2022 136  135 - 145 mmol/L Final   Potassium 07/23/2022 3.7  3.5 - 5.1 mmol/L Final   Chloride 07/23/2022 102  98 - 111 mmol/L Final   CO2 07/23/2022 22  22 - 32 mmol/L Final   Glucose, Bld 07/23/2022 100 (H)  70 - 99 mg/dL Final   Glucose reference range applies only to samples taken after fasting for at least 8 hours.   BUN 07/23/2022 9  6 - 20 mg/dL Final   Creatinine, Ser 07/23/2022 0.81  0.61 - 1.24 mg/dL Final   Calcium 13/10/6576 8.7 (L)  8.9 - 10.3 mg/dL Final   Total Protein 46/96/2952 7.4  6.5 - 8.1 g/dL Final   Albumin 84/13/2440 4.6  3.5 - 5.0 g/dL Final   AST 12/30/2534 27  15 - 41 U/L Final   ALT 07/23/2022 18  0 - 44 U/L Final    Alkaline Phosphatase 07/23/2022 70  38 - 126 U/L Final   Total Bilirubin 07/23/2022 0.7  0.3 - 1.2 mg/dL Final   GFR, Estimated 07/23/2022 >60  >60 mL/min Final   Comment: (NOTE) Calculated using the CKD-EPI Creatinine Equation (2021)    Anion gap 07/23/2022 12  5 - 15 Final   Performed at [facility], [address] [place], [place] [address]   Alcohol, Ethyl (B) 07/23/2022 262 (H)  <10 mg/dL Final   Comment: (NOTE) Lowest detectable limit for serum alcohol is 10 mg/dL.  For medical purposes only. Performed at [facility], [address] [place], [place] [address]    Salicylate Lvl 07/23/2022 <7.0 (L)  7.0 - 30.0 mg/dL Final   Performed at [facility], [address] [place], [place] [address]   Acetaminophen (Tylenol), Serum 07/23/2022 <10 (L)  10 - 30 ug/mL Final   Comment: (NOTE) Therapeutic concentrations vary significantly. A range of 10-30 ug/mL  may be an effective concentration for many patients. However, some  are best treated at concentrations outside of this range. Acetaminophen concentrations >150 ug/mL at 4 hours after ingestion  and >50 ug/mL at 12 hours after ingestion are often associated with  toxic reactions.  Performed at [facility], [address] [place], [place] [address]    WBC 07/23/2022 10.1  4.0 - 10.5 K/uL Final   RBC 07/23/2022 4.43  4.22 - 5.81 MIL/uL Final   Hemoglobin 07/23/2022 14.4  13.0 - 17.0 g/dL Final   HCT 32/95/1884 42.7  39.0 - 52.0 % Final   MCV 07/23/2022 96.4  80.0 - 100.0 fL Final   MCH  07/23/2022 32.5  26.0 - 34.0 pg Final   MCHC 07/23/2022 33.7  30.0 - 36.0 g/dL Final   RDW 40/98/1191 13.8  11.5 - 15.5 % Final   Platelets 07/23/2022 350  150 - 400 K/uL Final   nRBC 07/23/2022 0.0  0.0 - 0.2 % Final   Performed at [facility], [address] [place], [place] [address]   Tricyclic, Ur Screen 07/23/2022 NONE DETECTED  NONE DETECTED Final   Amphetamines, Ur Screen  07/23/2022 NONE DETECTED  NONE DETECTED Final   MDMA (Ecstasy)Ur Screen 07/23/2022 NONE DETECTED  NONE DETECTED Final   Cocaine Metabolite,Ur [place] 07/23/2022 NONE DETECTED  NONE DETECTED Final   Opiate, Ur Screen 07/23/2022 NONE DETECTED  NONE DETECTED Final   Phencyclidine (PCP) Ur S 07/23/2022 NONE DETECTED  NONE DETECTED Final   Cannabinoid 50 Ng, Ur [place] 07/23/2022 POSITIVE (A)  NONE DETECTED Final   Barbiturates, Ur Screen 07/23/2022 NONE DETECTED  NONE DETECTED Final   Benzodiazepine, Ur Scrn 07/23/2022 POSITIVE (A)  NONE DETECTED Final   Methadone Scn, Ur 07/23/2022 NONE DETECTED  NONE DETECTED Final   Comment: (NOTE) Tricyclics + metabolites, urine    Cutoff 1000 ng/mL Amphetamines + metabolites, urine  Cutoff 1000 ng/mL MDMA (Ecstasy), urine              Cutoff 500 ng/mL Cocaine Metabolite, urine          Cutoff 300 ng/mL Opiate + metabolites, urine        Cutoff 300 ng/mL Phencyclidine (PCP), urine         Cutoff 25 ng/mL Cannabinoid, urine                 Cutoff 50 ng/mL Barbiturates + metabolites, urine  Cutoff 200 ng/mL Benzodiazepine, urine              Cutoff 200 ng/mL Methadone, urine                   Cutoff 300 ng/mL  The urine drug screen provides only a preliminary, unconfirmed analytical test result and should not be used for non-medical purposes. Clinical consideration and professional judgment should be applied to any positive drug screen result due to possible interfering substances. A more specific alternate chemical method must be used in order to obtain a confirmed analytical result. Gas chromatography / mass spectrometry (GC/MS) is the preferred confirm                          atory method. Performed at [facility], [address] [place], [place] [address]   Admission on 07/21/2022, Discharged on 07/22/2022  Component Date Value Ref Range Status   Sodium 07/21/2022 133 (L)  135 - 145 mmol/L Final   Potassium 07/21/2022 3.6  3.5 - 5.1 mmol/L  Final   Chloride 07/21/2022 97 (L)  98 - 111 mmol/L Final   CO2 07/21/2022 25  22 - 32 mmol/L Final   Glucose, Bld 07/21/2022 110 (H)  70 - 99 mg/dL Final   Glucose reference range applies only to samples taken after fasting for at least 8 hours.   BUN 07/21/2022 8  6 - 20 mg/dL Final   Creatinine, Ser 07/21/2022 0.93  0.61 - 1.24 mg/dL Final   Calcium 08/65/7846 8.6 (L)  8.9 - 10.3 mg/dL Final   Total Protein 96/29/5284 7.4  6.5 - 8.1 g/dL Final   Albumin 13/24/4010 4.6  3.5 - 5.0 g/dL Final  AST 07/21/2022 26  15 - 41 U/L Final   ALT 07/21/2022 17  0 - 44 U/L Final   Alkaline Phosphatase 07/21/2022 70  38 - 126 U/L Final   Total Bilirubin 07/21/2022 0.4  0.3 - 1.2 mg/dL Final   GFR, Estimated 07/21/2022 >60  >60 mL/min Final   Comment: (NOTE) Calculated using the CKD-EPI Creatinine Equation (2021)    Anion gap 07/21/2022 11  5 - 15 Final   Performed at [facility], [address] [place], [place] [address]   Alcohol, Ethyl (B) 07/21/2022 201 (H)  <10 mg/dL Final   Comment: (NOTE) Lowest detectable limit for serum alcohol is 10 mg/dL.  For medical purposes only. Performed at [facility], [address] [place], [place] [address]    Salicylate Lvl 07/21/2022 <7.0 (L)  7.0 - 30.0 mg/dL Final   Performed at [facility], [address] [place], [place] [address]   Acetaminophen (Tylenol), Serum 07/21/2022 <10 (L)  10 - 30 ug/mL Final   Comment: (NOTE) Therapeutic concentrations vary significantly. A range of 10-30 ug/mL  may be an effective concentration for many patients. However, some  are best treated at concentrations outside of this range. Acetaminophen concentrations >150 ug/mL at 4 hours after ingestion  and >50 ug/mL at 12 hours after ingestion are often associated with  toxic reactions.  Performed at [facility], [address] [place], [place] [address]    WBC 07/21/2022 9.9  4.0 - 10.5 K/uL Final   RBC 07/21/2022 4.40   4.22 - 5.81 MIL/uL Final   Hemoglobin 07/21/2022 14.1  13.0 - 17.0 g/dL Final   HCT 29/56/2130 41.4  39.0 - 52.0 % Final   MCV 07/21/2022 94.1  80.0 - 100.0 fL Final   MCH 07/21/2022 32.0  26.0 - 34.0 pg Final   MCHC 07/21/2022 34.1  30.0 - 36.0 g/dL Final   RDW 86/57/8469 13.8  11.5 - 15.5 % Final   Platelets 07/21/2022 373  150 - 400 K/uL Final   nRBC 07/21/2022 0.0  0.0 - 0.2 % Final   Performed at [facility], [address] [place], [place] [address]   Tricyclic, Ur Screen 07/21/2022 NONE DETECTED  NONE DETECTED Final   Amphetamines, Ur Screen 07/21/2022 NONE DETECTED  NONE DETECTED Final   MDMA (Ecstasy)Ur Screen 07/21/2022 NONE DETECTED  NONE DETECTED Final   Cocaine Metabolite,Ur [place] 07/21/2022 POSITIVE (A)  NONE DETECTED Final   Opiate, Ur Screen 07/21/2022 NONE DETECTED  NONE DETECTED Final   Phencyclidine (PCP) Ur S 07/21/2022 NONE DETECTED  NONE DETECTED Final   Cannabinoid 50 Ng, Ur [place] 07/21/2022 POSITIVE (A)  NONE DETECTED Final   Barbiturates, Ur Screen 07/21/2022 NONE DETECTED  NONE DETECTED Final   Benzodiazepine, Ur Scrn 07/21/2022 POSITIVE (A)  NONE DETECTED Final   Methadone Scn, Ur 07/21/2022 NONE DETECTED  NONE DETECTED Final   Comment: (NOTE) Tricyclics + metabolites, urine    Cutoff 1000 ng/mL Amphetamines + metabolites, urine  Cutoff 1000 ng/mL MDMA (Ecstasy), urine              Cutoff 500 ng/mL Cocaine Metabolite, urine          Cutoff 300 ng/mL Opiate + metabolites, urine        Cutoff 300 ng/mL Phencyclidine (PCP), urine         Cutoff 25 ng/mL Cannabinoid, urine                 Cutoff 50  ng/mL Barbiturates + metabolites, urine  Cutoff 200 ng/mL Benzodiazepine, urine              Cutoff 200 ng/mL Methadone, urine                   Cutoff 300 ng/mL  The urine drug screen provides only a preliminary, unconfirmed analytical test result and should not be used for non-medical purposes. Clinical consideration and professional judgment should be  applied to any positive drug screen result due to possible interfering substances. A more specific alternate chemical method must be used in order to obtain a confirmed analytical result. Gas chromatography / mass spectrometry (GC/MS) is the preferred confirm                          atory method. Performed at [facility], [address] [place], [place] [address]   Admission on 06/20/2022, Discharged on 06/21/2022  Component Date Value Ref Range Status   Acetaminophen (Tylenol), Serum 06/20/2022 <10 (L)  10 - 30 ug/mL Final   Comment: (NOTE) Therapeutic concentrations vary significantly. A range of 10-30 ug/mL  may be an effective concentration for many patients. However, some  are best treated at concentrations outside of this range. Acetaminophen concentrations >150 ug/mL at 4 hours after ingestion  and >50 ug/mL at 12 hours after ingestion are often associated with  toxic reactions.  Performed at [facility], [address] [place], [place] [address]    Sodium 06/20/2022 139  135 - 145 mmol/L Final   Potassium 06/20/2022 3.4 (L)  3.5 - 5.1 mmol/L Final   Chloride 06/20/2022 105  98 - 111 mmol/L Final   CO2 06/20/2022 22  22 - 32 mmol/L Final   Glucose, Bld 06/20/2022 93  70 - 99 mg/dL Final   Glucose reference range applies only to samples taken after fasting for at least 8 hours.   BUN 06/20/2022 15  6 - 20 mg/dL Final   Creatinine, Ser 06/20/2022 0.82  0.61 - 1.24 mg/dL Final   Calcium 09/81/1914 8.8 (L)  8.9 - 10.3 mg/dL Final   Total Protein 78/29/5621 7.2  6.5 - 8.1 g/dL Final   Albumin 30/86/5784 4.3  3.5 - 5.0 g/dL Final   AST 69/62/9528 21  15 - 41 U/L Final   ALT 06/20/2022 16  0 - 44 U/L Final   Alkaline Phosphatase 06/20/2022 62  38 - 126 U/L Final   Total Bilirubin 06/20/2022 0.6  0.3 - 1.2 mg/dL Final   GFR, Estimated 06/20/2022 >60  >60 mL/min Final   Comment: (NOTE) Calculated using the CKD-EPI Creatinine Equation (2021)    Anion  gap 06/20/2022 12  5 - 15 Final   Performed at [facility], [address] [place], [place] [address]   Alcohol, Ethyl (B) 06/20/2022 180 (H)  <10 mg/dL Final   Comment: (NOTE) Lowest detectable limit for serum alcohol is 10 mg/dL.  For medical purposes only. Performed at [facility], [address] [place], [place] [address]    Salicylate Lvl 06/20/2022 <7.0 (L)  7.0 - 30.0 mg/dL Final   Performed at [facility], [address] [place], [place] [address]   WBC 06/20/2022 9.9  4.0 - 10.5 K/uL Final   RBC 06/20/2022 4.72  4.22 - 5.81 MIL/uL Final   Hemoglobin 06/20/2022 14.8  13.0 - 17.0 g/dL Final   HCT 64/40/3474 44.4  39.0 - 52.0 % Final  MCV 06/20/2022 94.1  80.0 - 100.0 fL Final   MCH 06/20/2022 31.4  26.0 - 34.0 pg Final   MCHC 06/20/2022 33.3  30.0 - 36.0 g/dL Final   RDW 40/98/1191 13.2  11.5 - 15.5 % Final   Platelets 06/20/2022 318  150 - 400 K/uL Final   nRBC 06/20/2022 0.0  0.0 - 0.2 % Final   Neutrophils Relative % 06/20/2022 51  % Final   Neutro Abs 06/20/2022 5.0  1.7 - 7.7 K/uL Final   Lymphocytes Relative 06/20/2022 39  % Final   Lymphs Abs 06/20/2022 3.8  0.7 - 4.0 K/uL Final   Monocytes Relative 06/20/2022 5  % Final   Monocytes Absolute 06/20/2022 0.5  0.1 - 1.0 K/uL Final   Eosinophils Relative 06/20/2022 4  % Final   Eosinophils Absolute 06/20/2022 0.4  0.0 - 0.5 K/uL Final   Basophils Relative 06/20/2022 1  % Final   Basophils Absolute 06/20/2022 0.1  0.0 - 0.1 K/uL Final   Immature Granulocytes 06/20/2022 0  % Final   Abs Immature Granulocytes 06/20/2022 0.02  0.00 - 0.07 K/uL Final   Performed at [facility], [address] [place], [place] [address]   Tricyclic, Ur Screen 06/21/2022 NONE DETECTED  NONE DETECTED Final   Amphetamines, Ur Screen 06/21/2022 NONE DETECTED  NONE DETECTED Final   MDMA (Ecstasy)Ur Screen 06/21/2022 NONE DETECTED  NONE DETECTED Final   Cocaine Metabolite,Ur [place] 06/21/2022 NONE  DETECTED  NONE DETECTED Final   Opiate, Ur Screen 06/21/2022 NONE DETECTED  NONE DETECTED Final   Phencyclidine (PCP) Ur S 06/21/2022 NONE DETECTED  NONE DETECTED Final   Cannabinoid 50 Ng, Ur [place] 06/21/2022 POSITIVE (A)  NONE DETECTED Final   Barbiturates, Ur Screen 06/21/2022 NONE DETECTED  NONE DETECTED Final   Benzodiazepine, Ur Scrn 06/21/2022 POSITIVE (A)  NONE DETECTED Final   Methadone Scn, Ur 06/21/2022 NONE DETECTED  NONE DETECTED Final   Comment: (NOTE) Tricyclics + metabolites, urine    Cutoff 1000 ng/mL Amphetamines + metabolites, urine  Cutoff 1000 ng/mL MDMA (Ecstasy), urine              Cutoff 500 ng/mL Cocaine Metabolite, urine          Cutoff 300 ng/mL Opiate + metabolites, urine        Cutoff 300 ng/mL Phencyclidine (PCP), urine         Cutoff 25 ng/mL Cannabinoid, urine                 Cutoff 50 ng/mL Barbiturates + metabolites, urine  Cutoff 200 ng/mL Benzodiazepine, urine              Cutoff 200 ng/mL Methadone, urine                   Cutoff 300 ng/mL  The urine drug screen provides only a preliminary, unconfirmed analytical test result and should not be used for non-medical purposes. Clinical consideration and professional judgment should be applied to any positive drug screen result due to possible interfering substances. A more specific alternate chemical method must be used in order to obtain a confirmed analytical result. Gas chromatography / mass spectrometry (GC/MS) is the preferred confirm                          atory method. Performed at [facility], [address] [place], [place] [address]    SARS Coronavirus 2 by RT PCR 06/21/2022  NEGATIVE  NEGATIVE Final   Comment: (NOTE) SARS-CoV-2 target nucleic acids are NOT DETECTED.  The SARS-CoV-2 RNA is generally detectable in upper respiratory specimens during the acute phase of infection. The lowest concentration of SARS-CoV-2 viral copies this assay can detect is 138 copies/mL. A  negative result does not preclude SARS-Cov-2 infection and should not be used as the sole basis for treatment or other patient management decisions. A negative result may occur with  improper specimen collection/handling, submission of specimen other than nasopharyngeal swab, presence of viral mutation(s) within the areas targeted by this assay, and inadequate number of viral copies(<138 copies/mL). A negative result must be combined with clinical observations, patient history, and epidemiological information. The expected result is Negative.  Fact Sheet for Patients:  BloggerCourse.com  Fact Sheet for Healthcare Providers:  SeriousBroker.it  This test is no                          t yet approved or cleared by the [place] FDA and  has been authorized for detection and/or diagnosis of SARS-CoV-2 by FDA under an Emergency Use Authorization (EUA). This EUA will remain  in effect (meaning this test can be used) for the duration of the COVID-19 declaration under Section 564(b)(1) of the Act, 21 U.S.C.section 360bbb-3(b)(1), unless the authorization is terminated  or revoked sooner.       Influenza A by PCR 06/21/2022 NEGATIVE  NEGATIVE Final   Influenza B by PCR 06/21/2022 NEGATIVE  NEGATIVE Final   Comment: (NOTE) The Xpert Xpress SARS-CoV-2/FLU/RSV plus assay is intended as an aid in the diagnosis of influenza from Nasopharyngeal swab specimens and should not be used as a sole basis for treatment. Nasal washings and aspirates are unacceptable for Xpert Xpress SARS-CoV-2/FLU/RSV testing.  Fact Sheet for Patients: BloggerCourse.com  Fact Sheet for Healthcare Providers: SeriousBroker.it  This test is not yet approved or cleared by the [place] FDA and has been authorized for detection and/or diagnosis of SARS-CoV-2 by FDA under an Emergency Use Authorization (EUA). This  EUA will remain in effect (meaning this test can be used) for the duration of the COVID-19 declaration under Section 564(b)(1) of the Act, 21 U.S.C. section 360bbb-3(b)(1), unless the authorization is terminated or revoked.     Resp Syncytial Virus by PCR 06/21/2022 NEGATIVE  NEGATIVE Final   Comment: (NOTE) Fact Sheet for Patients: BloggerCourse.com  Fact Sheet for Healthcare Providers: SeriousBroker.it  This test is not yet approved or cleared by the [place] FDA and has been authorized for detection and/or diagnosis of SARS-CoV-2 by FDA under an Emergency Use Authorization (EUA). This EUA will remain in effect (meaning this test can be used) for the duration of the COVID-19 declaration under Section 564(b)(1) of the Act, 21 U.S.C. section 360bbb-3(b)(1), unless the authorization is terminated or revoked.  Performed at [facility], [address] [place], [place] [address]     Allergies: Gabapentin  Medications:  Facility Ordered Medications  Medication   acetaminophen (TYLENOL) tablet 650 mg   alum & mag hydroxide-simeth (MAALOX/MYLANTA) 200-200-20 MG/5ML suspension 30 mL   magnesium hydroxide (MILK OF MAGNESIA) suspension 30 mL   traZODone (DESYREL) tablet 50 mg   thiamine (VITAMIN B1) injection 100 mg   [START ON 08/01/2022] thiamine (VITAMIN B1) tablet 100 mg   multivitamin with minerals tablet 1 tablet   LORazepam (ATIVAN) tablet 1 mg   hydrOXYzine (ATARAX) tablet 25 mg   loperamide (IMODIUM) capsule 2-4 mg  ondansetron (ZOFRAN-ODT) disintegrating tablet 4 mg   LORazepam (ATIVAN) tablet 1 mg   Followed by   Melene Muller ON 08/02/2022] LORazepam (ATIVAN) tablet 1 mg   Followed by   Melene Muller ON 08/03/2022] LORazepam (ATIVAN) tablet 1 mg   Followed by   Melene Muller ON 08/04/2022] LORazepam (ATIVAN) tablet 1 mg   PTA Medications  Medication Sig   traZODone (DESYREL) 100 MG tablet Take 1 tablet (100 mg total) by  mouth at bedtime.    Screenings    Flowsheet Row Most Recent Value  CIWA-Ar Total 4       Medical Decision Making  Pharoah Slavin. was admitted to [facility] continuous assessment unit for MDD (major depressive disorder), recurrent episode, severe (HCC), suicidal and homicidal ideation, substance abuse, crisis management, and stabilization. Routine labs ordered, which include Lab Orders         CBC with Differential/Platelet         Comprehensive metabolic panel         Hemoglobin A1c         Magnesium         Ethanol         Prolactin         Lipid panel         TSH         POCT Urine Drug Screen - (I-Screen)    Medication Management: Medications started Meds ordered this encounter  Medications   acetaminophen (TYLENOL) tablet 650 mg   alum & mag hydroxide-simeth (MAALOX/MYLANTA) 200-200-20 MG/5ML suspension 30 mL   magnesium hydroxide (MILK OF MAGNESIA) suspension 30 mL   traZODone (DESYREL) tablet 50 mg   thiamine (VITAMIN B1) injection 100 mg   thiamine (VITAMIN B1) tablet 100 mg   multivitamin with minerals tablet 1 tablet   DISCONTD: hydrOXYzine (ATARAX) tablet 25 mg   LORazepam (ATIVAN) tablet 1 mg   hydrOXYzine (ATARAX) tablet 25 mg   loperamide (IMODIUM) capsule 2-4 mg   ondansetron (ZOFRAN-ODT) disintegrating tablet 4 mg   FOLLOWED BY Linked Order Group    LORazepam (ATIVAN) tablet 1 mg    LORazepam (ATIVAN) tablet 1 mg    LORazepam (ATIVAN) tablet 1 mg    LORazepam (ATIVAN) tablet 1 mg    Will maintain observation checks every 15 minutes for safety Call to [facility], spoke with Dr. Rush Landmark to discuss EKG results. Dr. Rush Landmark confirms results unchanged from patient's previous EKGs in April 2024    Recommendations  Based on my evaluation the patient appears to have an emergency medical condition for which I recommend the patient be transferred to the emergency department for further evaluation.  -Affidavit and Petition for IVC/1st  exam completed -Recommend inpatient psychiatric treatment; patient has been accepted to [phone] pending labs, IVC, EKG per Rona Ravens, [facility] and can arrive tonight, attending provider is Dr. Sherron Flemings.  Sunday Corn, NP 07/31/22  7:09 PM

## 2022-07-31 NOTE — ED Notes (Signed)
Pt admitted to obs . Denies SI/HI  endorse /AH denies vh. Calm, cooperative throughout interview process. Skin assessment completed. Oriented to unit. Meal and drink offered. At currrent, pt continue to deny SI/HI/ endorse VH denies av. Pt verbally contract for safety. Will monitor for safety.

## 2022-07-31 NOTE — ED Notes (Signed)
[facility] Transport to [facility] requested.  Pt is IVC.

## 2022-07-31 NOTE — ED Notes (Signed)
Patient in milieu. Environment is secured. Will continue to monitor for safety. 

## 2022-07-31 NOTE — BH Assessment (Signed)
Comprehensive Clinical Assessment (CCA) Note  07/31/2022 Parker Wilson [id number]  DISPOSITION: Per [name] NP pt is recommended for Inpatient psychiatric treatment.   The patient demonstrates the following risk factors for suicide: Chronic risk factors for suicide include: psychiatric disorder of MDD, Recurrent, Severe, substance use disorder, and previous self-harm via various methods . Acute risk factors for suicide include: unemployment, social withdrawal/isolation, and loss (financial, interpersonal, professional). Protective factors for this patient include: positive social support and hope for the future. Considering these factors, the overall suicide risk at this point appears to be moderate. Patient is appropriate for outpatient follow up.    Pt is a 47 yo male who presented voluntarily and accompanied by his father. His father was not present during the assessment at pt's choice, Pt stated he was having suicidal thoughts "this afternoon" and "was having thoughts of putting a knife through my throat." Pt stated that he had hatchets and knives available. Pt stated he was not allowed guns because he is a felon. Pt stated "I can't do this anymore" although it was not entirely clear what he meant. Pt denied HI. Pt reported he takes a number of actions to harm himself physically such as breaking things, starting fights, punching walls and other stuff." Pt stated that he feels very angry and "would hurt anybody that looks to get hurt." Pt reported he see and hears his deceased fianc "in broad daylight." Pt stated that she died about 2 1/2 years ago from a Fentanyl overdose. pt stated "I punish myself" and feels guilty in some way for her death. Pt stated that he has twice tried to kill himself via Fentanyl overdoses but could not report when the last attempt occurred.   Pt stated that he has had IP psychiatric admissions with the last one about 2 1/2 years ago at [facility]. Pt reported current  substance use of Xanax bought off the street and alcohol daily. Pt stated that he drinks as much alcohol as he can get and does not know how much Xanax he is using regularly. Pt stated that he was prescribed Trazadone at the ED which he still takes but does not help him sleep. Pt stated that he has not used Fentanyl in about 2 1/2 month.   Chief Complaint: No chief complaint on file.  Visit Diagnosis:  MDD, Recurrent, Severe Alcohol Use d/o    CCA Screening, Triage and Referral (STR)  Patient Reported Information How did you hear about [place]? Self  What Is the Reason for Your Visit/Call Today? Pt is a 47 yo male who presented voluntarily and accompanied by his father. His father was not present during the assessment at pt's choice, Pt stated he was having suicidal thoughts "this afternoon" and "was having thoughts of putting a knife through my throat." Pt stated "I can't do this anymore" although it was not entirely clear what he meant. Pt denied HI. Pt reported he takes a number of actions to harm himself physically such as breaking things, starting fights, punching walls and other stuff." Pt stated that he feels very angry and "would hurt anybody that looks to get hurt." Pt reported he see and hears his deceased fiance "in broad daylight." Pt stated that she died about 2 1/2 years ago from a Fentanyl  overdose. pt stated "I punish myself" and feels guilty in some way fro her death. Pt stated that he has tweice tried to kill himself via Fentanyl overdoses but could not report when the  last attempt occurred. Pt stated that he has had IP psychiatric admissions with the last one about 2 1/2 years ago at [facility]. Pt reported current supstance use of Xanax bought off the street and alcohol daily. Pt stated that he drinks as much alcohol as he can get and does not know how much Xanax he is using regularly. Pt stated that he was prescribed Trazadone at the ED which he still takes but does not help him sleep. Pt  stated that he has not used Fentanyl in about 2 1/2 month.  How Long Has This Been Causing You Problems? > than 6 months  What Do You Feel Would Help You the Most Today? Treatment for Depression or other mood problem   Have You Recently Had Any Thoughts About Hurting Yourself? Yes  Are You Planning to Commit Suicide/Harm Yourself At This time? Yes (knife to the throat)   Flowsheet Row ED from 07/31/2022 in [facility] ED from 07/23/2022 in [facility] Emergency Department at [facility] ED from 07/21/2022 in [facility] Emergency Department at [facility]  C-SSRS RISK CATEGORY Moderate Risk Error: Q3, 4, or 5 should not be populated when Q2 is No High Risk       Have you Recently Had Thoughts About Hurting Someone Karolee Ohs? No  Are You Planning to Harm Someone at This Time? No  Explanation: na  Have You Used Any Alcohol or Drugs in the Past 24 Hours? Yes  What Did You Use and How Much? alcohol and Xanax in unknown amounts   Do You Currently Have a Therapist/Psychiatrist? No  Name of Therapist/Psychiatrist: Name of Therapist/Psychiatrist: na   Have You Been Recently Discharged From Any Office Practice or Programs? Yes  Explanation of Discharge From Practice/Program: was seen on 07/23/22 and discharged     CCA Screening Triage Referral Assessment Type of Contact: Face-to-Face  Telemedicine Service Delivery:   Is this Initial or Reassessment?   Date Telepsych consult ordered in [facility]:    Time Telepsych consult ordered in [facility]:    Location of Assessment: [facility] Assessment Services  Provider Location: [facility] Assessment Services   Collateral Involvement: none allowed   Does Patient Have a Automotive engineer Guardian? No  Legal Guardian Contact Information: na  Copy of Legal Guardianship Form: No - copy requested  Legal Guardian Notified of Arrival: -- (na)  Legal Guardian Notified of Pending Discharge: -- (na)  If Minor and  Not Living with Parent(s), Who has Custody? adult  Is CPS involved or ever been involved? Never (none reported)  Is APS involved or ever been involved? Never (none reported)   Patient Determined To Be At Risk for Harm To Self or Others Based on Review of Patient Reported Information or Presenting Complaint? Yes, for Self-Harm  Method: Plan with intent and identified person  Availability of Means: Has close by  Intent: Clearly intends on inflicting harm that could cause death  Notification Required: Identifiable person is aware  Additional Information for Danger to Others Potential: Previous attempts  Additional Comments for Danger to Others Potential: na  Are There Guns or Other Weapons in Your Home? No (Pt stated that he had hatchets and knives available. Pt stated he was not allowed guns because he is a felon.)  Types of Guns/Weapons: na  Are These Weapons Safely Secured?                            -- (  unknown regarding knives and hatchets)  Who Could Verify You Are Able To Have These Secured: unknown  Do You Have any Outstanding Charges, Pending Court Dates, Parole/Probation? denied- none reported  Contacted To Inform of Risk of Harm To Self or Others: -- (na)    Does Patient Present under Involuntary Commitment? No    [place] of Residence: [place]   Patient Currently Receiving the Following Services: Not Receiving Services   Determination of Need: Emergent (2 hours) (Per [name] NP pt is recommended for Inpatient psychiatric treatment.)   Options For Referral: Inpatient Hospitalization     CCA Biopsychosocial Patient Reported Schizophrenia/Schizoaffective Diagnosis in Past: No   Strengths: father accompannied-seems supportive   Mental Health Symptoms Depression:   Fatigue; Hopelessness; Increase/decrease in appetite; Irritability; Sleep (too much or little); Tearfulness; Worthlessness   Duration of Depressive symptoms:  Duration of  Depressive Symptoms: Greater than two weeks   Mania:   None   Anxiety:    Fatigue; Irritability; Sleep   Psychosis:   Hallucinations   Duration of Psychotic symptoms:  Duration of Psychotic Symptoms: Less than six months   Trauma:   Avoids reminders of event; Re-experience of traumatic event (Pt's fiance died 1 1/2 years ago of an overdose. Pt stated he punishes himself.)   Obsessions:   None   Compulsions:   None   Inattention:   N/A   Hyperactivity/Impulsivity:   N/A   Oppositional/Defiant Behaviors:   N/A   Emotional Irregularity:   Mood lability   Other Mood/Personality Symptoms:   none    Mental Status Exam Appearance and self-care  Stature:   Average   Weight:   Thin   Clothing:   Casual   Grooming:   Normal   Cosmetic use:   None   Posture/gait:   Normal   Motor activity:   Not Remarkable   Sensorium  Attention:   Normal   Concentration:   Normal   Orientation:   X5   Recall/memory:   Normal   Affect and Mood  Affect:   Depressed; Flat; Tearful; Blunted   Mood:   Depressed; Dysphoric; Pessimistic; Worthless   Relating  Eye contact:   Fleeting   Facial expression:   Depressed; Constricted; Sad   Attitude toward examiner:   Cooperative; Defensive; Guarded; Irritable   Thought and Language  Speech flow:  Clear and Coherent; Paucity   Thought content:   Appropriate to Mood and Circumstances   Preoccupation:   Ruminations (fiance's death/loss)   Hallucinations:   Auditory; Visual   Organization:   Patent examiner of Knowledge:   Average   Intelligence:   Average   Abstraction:   Functional   Judgement:   Fair   Dance movement psychotherapist:   Adequate   Insight:   Fair   Decision Making:   Normal   Social Functioning  Social Maturity:   Responsible   Social Judgement:   Normal   Stress  Stressors:   Grief/losses; Other (Comment) (Lack of sleep)   Coping Ability:    Exhausted; Overwhelmed   Skill Deficits:   Self-care   Supports:   Family     Religion: Religion/Spirituality Are You A Religious Person?: No How Might This Affect Treatment?: na  Leisure/Recreation: Leisure / Recreation Do You Have Hobbies?: No  Exercise/Diet: Exercise/Diet Do You Exercise?: No Have You Gained or Lost A Significant Amount of Weight in the Past Six Months?: No Do You Follow a Special Diet?: No  Do You Have Any Trouble Sleeping?: Yes Explanation of Sleeping Difficulties: Patient reports poor sleep   CCA Employment/Education Employment/Work Situation: Employment / Work Situation Employment Situation: Unemployed Work Stressors: None reported Patient's Job has Been Impacted by Current Illness: Yes Describe how Patient's Job has Been Impacted: Pt reports that he has shown up to work under the influence and been threatened to lose his job. Pt also stated that he could not work if he cannot sleep. Has Patient ever Been in the Military?: No  Education: Education Is Patient Currently Attending School?: No Last Grade Completed: 14 Did You Attend College?: Yes What Type of College Degree Do you Have?: 2 Associate Degrees Did You Have An Individualized Education Program (IIEP): No Did You Have Any Difficulty At School?: No Patient's Education Has Been Impacted by Current Illness: No   CCA Family/Childhood History Family and Relationship History: Family history Marital status: Divorced Divorced, when?: unknown What types of issues is patient dealing with in the relationship?: unknown Additional relationship information: na Does patient have children?: No  Childhood History:  Childhood History By whom was/is the patient raised?: Both parents Did patient suffer any verbal/emotional/physical/sexual abuse as a child?: No Has patient ever been sexually abused/assaulted/raped as an adolescent or adult?: No Witnessed domestic violence?: No Has patient been  affected by domestic violence as an adult?: No       CCA Substance Use Alcohol/Drug Use: Alcohol / Drug Use Pain Medications: see mar Prescriptions: see mar Over the Counter: see mar History of alcohol / drug use?: Yes Longest period of sobriety (when/how long): 2 1/2 months sober form Fentanyl Negative Consequences of Use: Personal relationships Withdrawal Symptoms: None Substance #1 Name of Substance 1: alcohol 1 - Age of First Use: teen 1 - Amount (size/oz): varies - "as much as I can get" 1 - Frequency: daily 1 - Duration: ongoing 1 - Last Use / Amount: earlier today 1 - Method of Aquiring: purchasing 1- Route of Use: oral Substance #2 Name of Substance 2: Xanax (No prescrition- "off the street") 2 - Age of First Use: teens - was prescribed for him in the past 2 - Amount (size/oz): varies 2 - Frequency: daily 2 - Duration: ongoing 2 - Last Use / Amount: earlier today 2 - Method of Aquiring: unknown 2 - Route of Substance Use: oral                     ASAM's:  Six Dimensions of Multidimensional Assessment  Dimension 1:  Acute Intoxication and/or Withdrawal Potential:      Dimension 2:  Biomedical Conditions and Complications:      Dimension 3:  Emotional, Behavioral, or Cognitive Conditions and Complications:     Dimension 4:  Readiness to Change:     Dimension 5:  Relapse, Continued use, or Continued Problem Potential:     Dimension 6:  Recovery/Living Environment:     ASAM Severity Score:    ASAM Recommended Level of Treatment: ASAM Recommended Level of Treatment: Level II Intensive Outpatient Treatment   Substance use Disorder (SUD) Substance Use Disorder (SUD)  Checklist Symptoms of Substance Use: Continued use despite having a persistent/recurrent physical/psychological problem caused/exacerbated by use, Evidence of tolerance, Presence of craving or strong urge to use, Social, occupational, recreational activities given up or reduced due to use,  Recurrent use that results in a failure to fulfill major role obligations (work, school, home), Large amounts of time spent to obtain, use or recover from the substance(s),  Substance(s) often taken in larger amounts or over longer times than was intended, Persistent desire or unsuccessful efforts to cut down or control use, Continued use despite persistent or recurrent social, interpersonal problems, caused or exacerbated by use  Recommendations for Services/Supports/Treatments: Recommendations for Services/Supports/Treatments Recommendations For Services/Supports/Treatments: CD-IOP Intensive Chemical Dependency Program, IOP (Intensive Outpatient Program), SAIOP (Substance Abuse Intensive Outpatient Program)  Discharge Disposition:    DSM5 Diagnoses: Patient Active Problem List   Diagnosis Date Noted   Suicidal ideation 07/31/2022   Homicidal ideations 07/31/2022   Substance induced mood disorder (HCC) 07/31/2022   Benzodiazepine abuse (HCC) 07/31/2022   Alcohol use 06/21/2022   Alcohol abuse with intoxication (HCC) 06/21/2022   History of substance abuse (HCC) 03/24/2021   History of suicidal ideation 04/12/2020   MDD (major depressive disorder), recurrent episode, severe (HCC) 03/31/2020   Annual physical exam 10/01/2018   Abnormal MRI, lumbar spine 01/28/2018   Lumbar herniated disc 01/28/2018   Cervicalgia 01/08/2018   Depression, recurrent (HCC) 10/08/2017   Chronic insomnia 10/08/2017   Chronic low back pain 10/08/2017   Tobacco abuse 02/26/2012   Headache, chronic daily 02/26/2012   Fatigue 02/26/2012   Anxiety 02/26/2012     Referrals to Alternative Service(s): Referred to Alternative Service(s):   Place:   Date:   Time:    Referred to Alternative Service(s):   Place:   Date:   Time:    Referred to Alternative Service(s):   Place:   Date:   Time:    Referred to Alternative Service(s):   Place:   Date:   Time:     Nikia Levels T, Counselor

## 2022-07-31 NOTE — Progress Notes (Signed)
   07/31/22 1501  [place] Suicide Severity Rating Scale  1. Wish to be Dead Yes  2. Suicidal Thoughts Yes  3. Suicidal Thoughts with Method Without Specific Plan or Intent to Act Yes  4. Suicidal Intent Without Specific Plan No  5. Suicide Intent with Specific Plan No  6. Suicide Behavior Question No  C-SSRS RISK CATEGORY Moderate Risk  Patient location: [facility] Urgent Care/Facility Based Crisis Center  [facility] Urgent Care/Facility Based Crisis Center Suicide Precautions Interventions  [facility] Suicide Precautions Interventions Moderate Risk Interventions

## 2022-07-31 NOTE — ED Notes (Signed)
Patient states that he would like medication for aniexty.

## 2022-07-31 NOTE — ED Notes (Signed)
MHT states that patient  states that he does not know why he was place out in an open meliu. States that he gets aggressive sometime. Rn place patient in flex.

## 2022-07-31 NOTE — ED Notes (Signed)
Rn notified provider that patient did not take his thiamine shot.

## 2022-07-31 NOTE — ED Notes (Signed)
Rn called police to come serve ivc papers

## 2022-07-31 NOTE — Progress Notes (Signed)
   07/31/22 1357  [facility] Triage Screening (Walk-ins at [facility] only)  What Is the Reason for Your Visit/Call Today? Pt is a 47 yo male who presented voluntarily and accompanied by his father. His father was not present during the assessment at pt's choice, Pt stated he was having suicidal thoughts "this afternoon" and "was having thoughts of putting a knife through my throat." Pt stated "I can't do this anymore" although it was not entirely clear what he meant. Pt denied HI. Pt reported he takes a number of actions to harm himself physically such as breaking things, starting fights, punching walls and other stuff." Pt stated that he feels very angry and "would hurt anybody that looks to get hurt." Pt reported he see and hears his deceased fiance "in broad daylight." Pt stated that she died about 2 1/2 years ago from a Fentanyl  overdose. pt stated "I punish myself" and feels guilty in some way fro her death. Pt stated that he has tweice tried to kill himself via Fentanyl overdoses but could not report when the last attempt occurred. Pt stated that he has had IP psychiatric admissions with the last one about 2 1/2 years ago at [facility]. Pt reported current supstance use of Xanax bought off the street and alcohol daily. Pt stated that he drinks as much alcohol as he can get and does not know how much Xanax he is using regularly. Pt stated that he was prescribed Trazadone at the ED which he still takes but does not help him sleep. Pt stated that he has not used Fentanyl in about 2 1/2 month.  How Long Has This Been Causing You Problems? > than 6 months  Have You Recently Had Any Thoughts About Hurting Yourself? Yes  How long ago did you have thoughts about hurting yourself? this afternoon  Are You Planning to Commit Suicide/Harm Yourself At This time? Yes (knife to the throat)  Have you Recently Had Thoughts About Hurting Someone Karolee Ohs? No  Are You Planning To Harm Someone At This Time? No  Are you currently  experiencing any auditory, visual or other hallucinations? Yes  Please explain the hallucinations you are currently experiencing: stated he sees and hears his deceased fiance regularly "in broad daylight"  Have You Used Any Alcohol or Drugs in the Past 24 Hours? Yes  How long ago did you use Drugs or Alcohol? alcohol and Xanax in unknown amounts  What Did You Use and How Much? alcohol and Xanax in unknown amounts  Do you have any current medical co-morbidities that require immediate attention? No  Clinician description of patient physical appearance/behavior: angry (clinching fists repeatedly). irritable, tense, stiff, flat affect, tearful, speaking in short answers or yes.no only, depressed mood  What Do You Feel Would Help You the Most Today? Treatment for Depression or other mood problem  If access to [facility] Urgent Care was not available, would you have sought care in the Emergency Department? Yes  Determination of Need Emergent (2 hours) (Per [name] NP pt is recommended for Inpatient psychiatric treatment.)  Options For Referral Inpatient Hospitalization

## 2022-07-31 NOTE — Progress Notes (Signed)
Pt was accepted to [facility] TODAY 07/31/2022; Bed Assignment 303-2 PENDING Labs, IVC paperwork faxed to [facility] [phone], and EKG  Pt meets inpatient criteria per Erskine Emery, NP  Attending Physician will be Dr. Phineas Inches, MD  Report can be called to: -Adult unit: [phone]  Pt can arrive after: PENDING items and NIGHT [facility] will coordinate with the care team.   Care Team notified: Day [facility] Rona Ravens, RN, Durwin Reges, RN, Erskine Emery, NP    Kelton Pillar, LCSWA 07/31/2022 @ 6:00 PM

## 2022-08-01 ENCOUNTER — Encounter (HOSPITAL_COMMUNITY): Payer: Self-pay

## 2022-08-01 DIAGNOSIS — F1320 Sedative, hypnotic or anxiolytic dependence, uncomplicated: Secondary | ICD-10-CM | POA: Insufficient documentation

## 2022-08-01 DIAGNOSIS — F1290 Cannabis use, unspecified, uncomplicated: Secondary | ICD-10-CM | POA: Insufficient documentation

## 2022-08-01 LAB — PROLACTIN: Prolactin: 18.4 ng/mL (ref 3.9–22.7)

## 2022-08-01 LAB — HEMOGLOBIN A1C
Hgb A1c MFr Bld: 5.5 % (ref 4.8–5.6)
Mean Plasma Glucose: 111 mg/dL

## 2022-08-01 MED ORDER — QUETIAPINE FUMARATE 100 MG PO TABS
100.0000 mg | ORAL_TABLET | Freq: Every day | ORAL | Status: DC
  Administered 2022-08-01 – 2022-08-03 (×3): 100 mg via ORAL
  Filled 2022-08-01 (×4): qty 1
  Filled 2022-08-01: qty 7

## 2022-08-01 MED ORDER — MIRTAZAPINE 15 MG PO TABS
15.0000 mg | ORAL_TABLET | Freq: Every day | ORAL | Status: DC
  Administered 2022-08-01 – 2022-08-03 (×3): 15 mg via ORAL
  Filled 2022-08-01: qty 7
  Filled 2022-08-01 (×4): qty 1

## 2022-08-01 MED ORDER — NICOTINE 14 MG/24HR TD PT24
14.0000 mg | MEDICATED_PATCH | Freq: Every day | TRANSDERMAL | Status: DC
  Administered 2022-08-01 – 2022-08-04 (×4): 14 mg via TRANSDERMAL
  Filled 2022-08-01 (×6): qty 1

## 2022-08-01 NOTE — Group Note (Signed)
Recreation Therapy Group Note   Group Topic:Communication  Group Date: 08/01/2022 Start Time: 0930 End Time: 1000 Facilitators: Crawford Tamura-McCall, LRT,CTRS Location: [address] Dayroom   Goal Area(s) Addresses:  Patient will effectively listen to complete activity.  Patient will identify communication skills used to make activity successful.  Patient will identify how skills used during activity can be used to reach post d/c goals.    Group Description:  Geometric Drawings.  Three volunteers from the peer group will be shown an abstract picture with a particular arrangement of geometrical shapes.  Each round, one 'speaker' will describe the pattern, as accurately as possible without revealing the image to the group.  The remaining group members will listen and draw the picture to reflect how it is described to them. Patients with the role of 'listener' cannot ask clarifying questions but, may request that the speaker repeat a direction. Once the drawings are complete, the presenter will show the rest of the group the picture and compare how close each person came to drawing the picture. LRT will facilitate a post-activity discussion regarding effective communication and the importance of planning, listening, and asking for clarification in daily interactions with others.   Affect/Mood: N/A   Participation Level: Did not attend    Clinical Observations/Individualized Feedback:     Plan: Continue to engage patient in RT group sessions 2-3x/week.   Kyrianna Barletta-McCall, LRT,CTRS 08/01/2022 11:54 AM

## 2022-08-01 NOTE — BH IP Treatment Plan (Signed)
Interdisciplinary Treatment and Diagnostic Plan Update  08/01/2022 Time of Session: 10:45am [name] MRN: [id number]  Principal Diagnosis: MDD (major depressive disorder), recurrent, severe, with psychosis (HCC)  Secondary Diagnoses: Principal Problem:   MDD (major depressive disorder), recurrent, severe, with psychosis (HCC) Active Problems:   Severe benzodiazepine use disorder (HCC)   Cannabis use disorder   Current Medications:  Current Facility-Administered Medications  Medication Dose Route Frequency Provider Last Rate Last Admin   acetaminophen (TYLENOL) tablet 650 mg  650 mg Oral Q6H PRN Sunday Corn, NP       alum & mag hydroxide-simeth (MAALOX/MYLANTA) 200-200-20 MG/5ML suspension 30 mL  30 mL Oral Q4H PRN Sunday Corn, NP       diphenhydrAMINE (BENADRYL) capsule 50 mg  50 mg Oral TID PRN Sunday Corn, NP       Or   diphenhydrAMINE (BENADRYL) injection 50 mg  50 mg Intramuscular TID PRN Sunday Corn, NP       haloperidol (HALDOL) tablet 5 mg  5 mg Oral TID PRN Sunday Corn, NP       Or   haloperidol lactate (HALDOL) injection 5 mg  5 mg Intramuscular TID PRN Sunday Corn, NP       hydrOXYzine (ATARAX) tablet 25 mg  25 mg Oral Q6H PRN Sunday Corn, NP   25 mg at 08/01/22 0749   loperamide (IMODIUM) capsule 2-4 mg  2-4 mg Oral PRN Sunday Corn, NP       LORazepam (ATIVAN) tablet 2 mg  2 mg Oral TID PRN Sunday Corn, NP       Or   LORazepam (ATIVAN) injection 2 mg  2 mg Intramuscular TID PRN Sunday Corn, NP       LORazepam (ATIVAN) tablet 1 mg  1 mg Oral Q6H PRN Sunday Corn, NP       LORazepam (ATIVAN) tablet 1 mg  1 mg Oral QID Sunday Corn, NP   1 mg at 08/01/22 1243   Followed by   Melene Muller ON 08/02/2022] LORazepam (ATIVAN) tablet 1 mg  1 mg Oral TID Sunday Corn, NP       Followed by   Melene Muller ON 08/03/2022] LORazepam (ATIVAN) tablet 1 mg  1 mg Oral BID Sunday Corn, NP       Followed by   Melene Muller  ON 08/05/2022] LORazepam (ATIVAN) tablet 1 mg  1 mg Oral Daily Sunday Corn, NP       magnesium hydroxide (MILK OF MAGNESIA) suspension 30 mL  30 mL Oral Daily PRN Sunday Corn, NP       mirtazapine (REMERON) tablet 15 mg  15 mg Oral QHS Nwoko, Agnes I, NP       multivitamin with minerals tablet 1 tablet  1 tablet Oral Daily Sunday Corn, NP   1 tablet at 08/01/22 0749   nicotine (NICODERM CQ - dosed in mg/24 hours) patch 14 mg  14 mg Transdermal Daily Onuoha, Chinwendu V, NP   14 mg at 08/01/22 0749   ondansetron (ZOFRAN-ODT) disintegrating tablet 4 mg  4 mg Oral Q6H PRN Sunday Corn, NP       QUEtiapine (SEROQUEL) tablet 100 mg  100 mg Oral QHS Nwoko, Agnes I, NP       thiamine (Vitamin B-1) tablet 100 mg  100 mg Oral Daily Sunday Corn, NP   100 mg at 08/01/22 4098   PTA Medications: Medications Prior  to Admission  Medication Sig Dispense Refill Last Dose   ibuprofen (ADVIL) 200 MG tablet Take 800 mg by mouth every 6 (six) hours as needed (For pain).      Multiple Vitamin (MULTIVITAMIN WITH MINERALS) TABS tablet Take 1 tablet by mouth daily.      traZODone (DESYREL) 100 MG tablet Take 1 tablet (100 mg total) by mouth at bedtime. 30 tablet 0     Patient Stressors: Medication change or noncompliance   Substance abuse   Traumatic event    Patient Strengths: Ability for insight  Financial means  General fund of knowledge  Supportive family/friends   Treatment Modalities: Medication Management, Group therapy, Case management,  1 to 1 session with clinician, Psychoeducation, Recreational therapy.   Physician Treatment Plan for Primary Diagnosis: MDD (major depressive disorder), recurrent, severe, with psychosis (HCC) Long Term Goal(s): Improvement in symptoms so as ready for discharge   Short Term Goals: Ability to identify and develop effective coping behaviors will improve Ability to maintain clinical measurements within normal limits will improve Compliance  with prescribed medications will improve Ability to identify triggers associated with substance abuse/mental health issues will improve Ability to identify changes in lifestyle to reduce recurrence of condition will improve Ability to verbalize feelings will improve Ability to disclose and discuss suicidal ideas Ability to demonstrate self-control will improve  Medication Management: Evaluate patient's response, side effects, and tolerance of medication regimen.  Therapeutic Interventions: 1 to 1 sessions, Unit Group sessions and Medication administration.  Evaluation of Outcomes: Progressing  Physician Treatment Plan for Secondary Diagnosis: Principal Problem:   MDD (major depressive disorder), recurrent, severe, with psychosis (HCC) Active Problems:   Severe benzodiazepine use disorder (HCC)   Cannabis use disorder  Long Term Goal(s): Improvement in symptoms so as ready for discharge   Short Term Goals: Ability to identify and develop effective coping behaviors will improve Ability to maintain clinical measurements within normal limits will improve Compliance with prescribed medications will improve Ability to identify triggers associated with substance abuse/mental health issues will improve Ability to identify changes in lifestyle to reduce recurrence of condition will improve Ability to verbalize feelings will improve Ability to disclose and discuss suicidal ideas Ability to demonstrate self-control will improve     Medication Management: Evaluate patient's response, side effects, and tolerance of medication regimen.  Therapeutic Interventions: 1 to 1 sessions, Unit Group sessions and Medication administration.  Evaluation of Outcomes: Progressing   RN Treatment Plan for Primary Diagnosis: MDD (major depressive disorder), recurrent, severe, with psychosis (HCC) Long Term Goal(s): Knowledge of disease and therapeutic regimen to maintain health will improve  Short Term  Goals: Ability to remain free from injury will improve, Ability to verbalize frustration and anger appropriately will improve, Ability to demonstrate self-control, Ability to participate in decision making will improve, Ability to verbalize feelings will improve, Ability to disclose and discuss suicidal ideas, Ability to identify and develop effective coping behaviors will improve, and Compliance with prescribed medications will improve  Medication Management: RN will administer medications as ordered by provider, will assess and evaluate patient's response and provide education to patient for prescribed medication. RN will report any adverse and/or side effects to prescribing provider.  Therapeutic Interventions: 1 on 1 counseling sessions, Psychoeducation, Medication administration, Evaluate responses to treatment, Monitor vital signs and CBGs as ordered, Perform/monitor CIWA, COWS, AIMS and Fall Risk screenings as ordered, Perform wound care treatments as ordered.  Evaluation of Outcomes: Progressing   LCSW Treatment Plan for Primary Diagnosis: MDD (  major depressive disorder), recurrent, severe, with psychosis (HCC) Long Term Goal(s): Safe transition to appropriate next level of care at discharge, Engage patient in therapeutic group addressing interpersonal concerns.  Short Term Goals: Engage patient in aftercare planning with referrals and resources, Increase social support, Increase ability to appropriately verbalize feelings, Increase emotional regulation, Facilitate acceptance of mental health diagnosis and concerns, Facilitate patient progression through stages of change regarding substance use diagnoses and concerns, Identify triggers associated with mental health/substance abuse issues, and Increase skills for wellness and recovery  Therapeutic Interventions: Assess for all discharge needs, 1 to 1 time with Social worker, Explore available resources and support systems, Assess for adequacy in  community support network, Educate family and significant other(s) on suicide prevention, Complete Psychosocial Assessment, Interpersonal group therapy.  Evaluation of Outcomes: Progressing   Progress in Treatment: Attending groups: Yes. Participating in groups: Yes. Taking medication as prescribed: Yes. Toleration medication: Yes. Family/Significant other contact made: No, will contact:  declined consents  Patient understands diagnosis: Yes. Discussing patient identified problems/goals with staff: Yes. Medical problems stabilized or resolved: Yes. Denies suicidal/homicidal ideation: Yes. Issues/concerns per patient self-inventory: Yes. Other: Patient states that he needs benzos for sleep  New problem(s) identified: No, Describe:  none reported  New Short Term/Long Term Goal(s): detox, medication management for mood stabilization; elimination of SI thoughts; development of comprehensive mental wellness/sobriety plan     Patient Goals:   Pt states, " I need to fix my sleep"  Discharge Plan or Barriers: Long history of benzo use.  Patient does not have buy in with other meds other than benzos.  Patient recently admitted. CSW will continue to follow and assess for appropriate referrals and possible discharge planning.    Reason for Continuation of Hospitalization: Anxiety Depression Medication stabilization Other; describe sleep issues  Estimated Length of Stay: 1-3 days  Last 3 [place] Suicide Severity Risk Score: Flowsheet Row Admission (Current) [facility] Most recent reading at 07/31/2022 10:38 PM ED from 07/31/2022 in [facility] Most recent reading at 07/31/2022  3:51 PM ED from 07/23/2022 in [facility] Most recent reading at 07/23/2022  2:05 PM  C-SSRS RISK CATEGORY Moderate Risk Moderate Risk Error: Q3, 4, or 5 should not be populated when Q2 is No        Last PHQ 2/9 Scores:    03/24/2021    2:24 PM 03/15/2020    8:00 AM 04/02/2018    9:18 AM  Depression screen PHQ 2/9  Decreased Interest 1 0 0  Down, Depressed, Hopeless 1 0 0  PHQ - 2 Score 2 0 0  Altered sleeping 2    Tired, decreased energy 2    Change in appetite 0    Feeling bad or failure about yourself  0    Trouble concentrating 1    Moving slowly or fidgety/restless 2    Suicidal thoughts 0    PHQ-9 Score 9    Difficult doing work/chores Somewhat difficult      Scribe for Treatment Team: Beatris Si, LCSW 08/01/2022 2:49 PM
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
## 2022-08-01 NOTE — [facility] Suicide Risk Assessment (Signed)
Suicide Risk Assessment  Admission Assessment    [facility] Admission Suicide Risk Assessment   Nursing information obtained from:  Patient  Demographic factors:  Male, Caucasian, Living alone  Current Mental Status:  NA  Loss Factors:  Loss of significant relationship  Historical Factors:  Prior suicide attempts, Anniversary of important loss, Impulsivity  Risk Reduction Factors:  Positive social support, Sense of responsibility to family  Total Time spent with patient: 1 hour  Principal Problem: MDD (major depressive disorder), recurrent, severe, with psychosis (HCC)  Diagnosis:  Principal Problem:   MDD (major depressive disorder), recurrent, severe, with psychosis (HCC)  Subjective Data:  See H&P.  Continued Clinical Symptoms:  Alcohol Use Disorder Identification Test Final Score (AUDIT): 12 The "Alcohol Use Disorders Identification Test", Guidelines for Use in Primary Care, Second Edition.  World Science writer [facility]). Score between 0-7:  no or low risk or alcohol related problems. Score between 8-15:  moderate risk of alcohol related problems. Score between 16-19:  high risk of alcohol related problems. Score 20 or above:  warrants further diagnostic evaluation for alcohol dependence and treatment.  CLINICAL FACTORS:   Severe Anxiety and/or Agitation Depression:   Comorbid alcohol abuse/dependence Hopelessness Insomnia Alcohol/Substance Abuse/Dependencies More than one psychiatric diagnosis Previous Psychiatric Diagnoses and Treatments  Musculoskeletal: Strength & Muscle Tone: within normal limits Gait & Station: normal Patient leans: N/A  Psychiatric Specialty Exam:  Presentation  General Appearance:  Appropriate for Environment; Casual  Eye Contact: Minimal  Speech: Clear and Coherent; Normal Rate  Speech Volume: Normal  Handedness: Right  Mood and Affect  Mood: Irritable; Depressed; Labile  Affect: Tearful; Congruent; Labile  Thought  Process  Thought Processes: Coherent; Goal Directed  Descriptions of Associations:Intact  Orientation:Full (Time, Place and Person)  Thought Content:Illogical  History of Schizophrenia/Schizoaffective disorder:No  Duration of Psychotic Symptoms:Less than six months  Hallucinations:Hallucinations: Auditory; Visual Description of Auditory Hallucinations: Patient states he sees "just her" referring to his deceased fiance Description of Visual Hallucinations: Patient states he hears his deceased fiance's voice  Ideas of Reference:None  Suicidal Thoughts:Suicidal Thoughts: Yes, Active SI Active Intent and/or Plan: With Intent; With Plan; With Means to Carry Out; With Access to Means  Homicidal Thoughts:Homicidal Thoughts: Yes, Passive HI Passive Intent and/or Plan: Without Plan; Without Intent  Sensorium  Memory: Immediate Good; Recent Good; Remote Good  Judgment: Impaired  Insight: Poor  Executive Functions  Concentration: Good  Attention Span: Good  Recall: Good  Fund of Knowledge: Good  Language: Good  Psychomotor Activity  Psychomotor Activity: Psychomotor Activity: Normal  Assets  Assets: Desire for Improvement; Housing; Physical Health; Resilience; Social Support  Sleep  Sleep: Sleep: Poor Number of Hours of Sleep: 2  Physical Exam: Blood pressure (!) 127/90, pulse 97, temperature 98.5 F (36.9 C), temperature source Oral, resp. rate 18, height 5\' 9"  (1.753 m), weight 58.9 kg, SpO2 97 %. Body mass index is 19.18 kg/m.  COGNITIVE FEATURES THAT CONTRIBUTE TO RISK:  Closed-mindedness, Loss of executive function, Polarized thinking, and Thought constriction (tunnel vision)    SUICIDE RISK:   Mild:  Suicidal ideation of limited frequency, intensity, duration, and specificity.  There are no identifiable plans, no associated intent, mild dysphoria and related symptoms, good self-control (both objective and subjective assessment), few other risk  factors, and identifiable protective factors, including available and accessible social support.  PLAN OF CARE: See H&P.  I certify that inpatient services furnished can reasonably be expected to improve the patient's condition.   Armandina Stammer, NP,  pmhnp, fnp-bc. 08/01/2022, 9:48 AM

## 2022-08-01 NOTE — [facility] Group Notes (Signed)
Pt did not attend physical wellness group  

## 2022-08-01 NOTE — Progress Notes (Signed)
Pt denied SI/HI/AVH this morning. Pt rated his depression a 0/10, anxiety a 9/10. PRN hydroxyzine provided to patient for anxiety per request. Patient wants to know when he will be able to discharge. RN provided support and encouragement to patient. Pt given scheduled medications as prescribed. Q15 min checks verified for safety. Patient verbally contracts for safety. Patient compliant with medications and treatment plan. Patient is interacting well on the unit. Pt is safe on the unit.   08/01/22 1000  Psych Admission Type (Psych Patients Only)  Admission Status Involuntary  Psychosocial Assessment  Patient Complaints Anxiety;Substance abuse  Eye Contact Brief  Facial Expression Anxious;Flat  Affect Anxious  Speech Logical/coherent  Interaction Assertive  Motor Activity Restless  Appearance/Hygiene Unremarkable  Behavior Characteristics Anxious;Irritable  Mood Anxious;Irritable  Thought Process  Coherency WDL  Content Preoccupation  Delusions None reported or observed  Perception WDL  Hallucination None reported or observed  Judgment Impaired  Confusion None  Danger to Self  Current suicidal ideation? Denies  Description of Suicide Plan No plan  Agreement Not to Harm Self Yes  Description of Agreement Pt verbally contracts for safety  Danger to Others  Danger to Others None reported or observed

## 2022-08-01 NOTE — Group Note (Signed)
Date:  08/01/2022 Time:  10:47 AM  Group Topic/Focus:  Goals Group:   The focus of this group is to help patients establish daily goals to achieve during treatment and discuss how the patient can incorporate goal setting into their daily lives to aide in recovery. Orientation:   The focus of this group is to educate the patient on the purpose and policies of crisis stabilization and provide a format to answer questions about their admission.  The group details unit policies and expectations of patients while admitted.    Participation Level:  Did Not Attend  Additional Comments:  Patient was encouraged to attend group multiple times.   Allyce Bochicchio T Lorraine Lax 08/01/2022, 10:47 AM

## 2022-08-01 NOTE — [facility] Counselor (Signed)
Adult Comprehensive Assessment  Patient ID: Parker Wilson., male   DOB: 09/12/1975, 47 y.o.   MRN: [id number]  Information Source: Information source: Patient  Current Stressors:  Patient states their primary concerns and needs for treatment are:: 47 y/o pt presents under IVC and states that he believes that he feels overwhelmed and reports increased symptoms of anxiety and states that he has been using Benzodiaziphines and Opioids to fall asleep. pt reports substance both past and present and attributes his current use to the accidental overdose of fentanyl by his deceased fiance. pt reports guilt and anger when expressing this and continiously expressed a desire for more Benzos. Patient states their goals for this hospitilization and ongoing recovery are:: Administrator, arts / Learning stressors: none reported Employment / Job issues: pt reports recently resigning from his job Family Relationships: none reported Surveyor, quantity / Lack of resources (include bankruptcy): pt denied Housing / Lack of housing: May be facing eviction due to unemployment Physical health (include injuries & life threatening diseases): I am not able to sleep Social relationships: pt denied Substance abuse: Daily Opiod, ETOH and Benzodiazipine dependence Bereavement / Loss: Fiance 2 years ago Fentanyl overdose  Living/Environment/Situation:  What is atmosphere in current home: Comfortable  Family History:  Marital status: Divorced Divorced, when?: unknown What types of issues is patient dealing with in the relationship?: unknown Are you sexually active?: No What is your sexual orientation?: Heterosexual Has your sexual activity been affected by drugs, alcohol, medication, or emotional stress?: None noted Does patient have children?: No  Childhood History:  By whom was/is the patient raised?: Both parents Additional childhood history information: Pt reported having a good childhood Description of  patient's relationship with caregiver when they were a child: "great" Patient's description of current relationship with people who raised him/her: I am close to my dad but I don't deal with "[name]" How were you disciplined when you got in trouble as a child/adolescent?: "my dad would whoop" Does patient have siblings?: Yes Number of Siblings: 1 Description of patient's current relationship with siblings: "I have a brother, we font talk much anymore" Did patient suffer any verbal/emotional/physical/sexual abuse as a child?: No Did patient suffer from severe childhood neglect?: No Has patient ever been sexually abused/assaulted/raped as an adolescent or adult?: No Was the patient ever a victim of a crime or a disaster?: No Has patient been affected by domestic violence as an adult?: No  Education:  Highest grade of school patient has completed: I have two Associates Degrees Currently a student?: No Learning disability?: No  Employment/Work Situation:   Employment Situation: Unemployed Work Stressors: None reported Patient's Job has Been Impacted by Current Illness: Yes Describe how Patient's Job has Been Impacted: Pt reports that he has shown up to work under the influence and been threatened to lose his job. Pt also stated that he could not work if he cannot sleep. What is the Longest Time Patient has Held a Job?: Pt reports current position is the longest. Where was the Patient Employed at that Time?: None noted Has Patient ever Been in the [employer]?: No  Financial Resources:   Financial resources: No income, Medicaid Does patient have a Lawyer or guardian?: No  Alcohol/Substance Abuse:   What has been your use of drugs/alcohol within the last 12 months?: Just about everyday since my fiance Overdosed Alcohol/Substance Abuse Treatment Hx: Past Tx, Inpatient, Past Tx, Outpatient, Substance abuse evaluation, Past detox If yes, describe treatment: Pt reports being  hospital[facility]  [facility] and was in patient at [facility] Has alcohol/substance abuse ever caused legal problems?: Yes  Social Support System:   Patient's Community Support System: Poor Describe Community Support System: I was seeing a doctor at [employer] [facility] and now she won't prescribe my Benzos Type of faith/religion: Agnostic How does patient's faith help to cope with current illness?: DNA  Leisure/Recreation:   Do You Have Hobbies?: Yes Leisure and Hobbies: "camp, hike, draw and play guitar"  Strengths/Needs:   What is the patient's perception of their strengths?: Resilience Patient states they can use these personal strengths during their treatment to contribute to their recovery: DNA Patient states these barriers may affect/interfere with their treatment: Sleep Patient states these barriers may affect their return to the community: none reported  Discharge Plan:   Currently receiving community mental health services: No Patient states concerns and preferences for aftercare planning are: [place] no longer sees provider Patient states they will know when they are safe and ready for discharge when: I don't feel unsafe, I just want to go to sleep Does patient have access to transportation?: Yes Does patient have financial barriers related to discharge medications?: No Patient description of barriers related to discharge medications: None reported Will patient be returning to same living situation after discharge?: Yes  Summary/Recommendations:   Summary and Recommendations (to be completed by the evaluator): 47 y/o male pt presents under IVC and states that he has relied on Opioids an Benzodiazipines to sleep . Pt asserts that he has experienced increased symptoms of depression and attributes this to the death of his fiance , whom pt states died as a result of a Fentanyl overdose.Pt asserts that he had recently resigned from his job and is currently unemployed . pt express liitle  interest in therapy and states that he only wants medication to control his anxiety. pt currently denies SI/HI or SH. While here, Parker Wilson can benefit from crisis stabilization, medication management, therapeutic milieu, and referrals for services.  Parker Catarino S Mallissa Lorenzen. 08/01/2022

## 2022-08-01 NOTE — Progress Notes (Signed)
Psychoeducational Group Note  Date:  08/01/2022 Time:  2138  Group Topic/Focus:  Relapse Prevention Planning:   The focus of this group is to define relapse and discuss the need for planning to combat relapse.  Participation Level: Did Not Attend  Participation Quality:  Not Applicable  Affect:  Not Applicable  Cognitive:  Not Applicable  Insight:  Not Applicable  Engagement in Group: Not Applicable  Additional Comments:  The patient did not attend the evening N.A. speaker's meeting.   Westly Pam 08/01/2022, 9:39 PM

## 2022-08-02 NOTE — Progress Notes (Signed)
[facility] MD Progress Note  08/02/2022 5:09 PM Parker Wilson.  MRN:  [id number]  Subjective:  47 year old Caucasian male with hx of alcohol use disorder, Benzodiazepine use disorder, anxiety disorder, panic disorder & major depressive disorder. He is admitted to the [facility] from the [facility] with complaint of suicidal/homicidal ideations, worsening depressive & anxiety symptoms with psychosis. Chart review indicated patient was admitted/treated at the [facility] in January of 2022 with similar complaints. He was discharged on Melatonin & Trazodone. On this present admission, patient's toxicology/UDS results showed his BAL was 95 & UDS was positive for Benzodiazepine & cannabis.   Daily notes: Parker Wilson is seen is seen in his room. Chart reviewed. The chart findings discussed with the treatment team. He presents alert, oriented & aware of situation. He is making a good eye contact & verbally responsive. He presents with an improving affect. He reports, "I feel so much better this morning. I slept 6.5 hours of uninterrupted/undisturbed sleep last night. I do not have any symptoms of depression. I still have some anxiety, however, with a good night sleep, it is easy for me to  handle/deal with the anxiety symptoms. I have been to group sessions. My father visited me last night & the visit went well". Parker Wilson any SIHI, AVH, delusional thoughts or paranoia. He does not appear to be responding to any internal stimuli. There are currently no changes made on the current plan of care. He is tolerating his medications without any side effects reported. Will continue as already in progress. Reviewed vital signs, stable.   Collateral information obtained, provided by patient's father Parker Wilson via the telephone [phone]. Parker Wilson reports, "I have already talked to a SW worker today. So, here is what I can tell you. I think Parker Wilson is improving. I came to visit Wilson last night, he looked well, relaxed & presents a lot better  than I have seen in the last few months. We did talk & he sounded a lot better. I have also talked to Wilson today & he sounded good. What ever medicine you all gave Wilson that helped Wilson sleep, please discharge Wilson on it. Parker Wilson was started on Xanax when he was too young, then when they stopped it, there were no alternative medicine recommended. He has gone to several places seeking help, but no one was able to help Wilson. It is good to see Parker Wilson & he can be very funny when he is feeling well. Ps, call me to let me know the possible discharge date because he will probably return home with me for few weeks to assure that he is doing well on his medicines. I thank you all for working with Wilson.  Principal Problem: MDD (major depressive disorder), recurrent, severe, with psychosis (HCC)  Diagnosis: Principal Problem:   MDD (major depressive disorder), recurrent, severe, with psychosis (HCC) Active Problems:   Severe benzodiazepine use disorder (HCC)   Cannabis use disorder  Total Time spent with patient:  35 minutes  Past Psychiatric History: See H&P.  Past Medical History:  Past Medical History:  Diagnosis Date   Anxiety    Chronic back pain    Chronic headaches    COVID-19    2021   Depression    Insomnia    Scoliosis    Substance abuse (HCC)    uds +cocaine, meth, thc and benzos and +etoh c/o uds 03/30/20   Vitamin D deficiency  Past Surgical History:  Procedure Laterality Date   tubes in ears     age 32 or 47 y.o    Family History:  Family History  Problem Relation Age of Onset   Heart disease Father        cabg x 3    Hypertension Father    Arthritis Father    Depression Father    Hyperlipidemia Father    Crohn's disease Father    Hypertension Brother    Alcohol abuse Brother    Depression Brother    Hyperlipidemia Brother    Cancer Paternal Aunt        breast cancer   Cancer Paternal Grandfather        Lung cancer   Alcohol abuse Paternal Grandfather     Arthritis Paternal Grandfather    COPD Paternal Grandfather    Early death Paternal Grandfather    Heart disease Paternal Grandfather        cabg x 4    Hyperlipidemia Paternal Grandfather    Hypertension Paternal Grandfather    Other Paternal Grandfather        died covid 13-May-2019   Arthritis Mother        RA   Arthritis Paternal Grandmother    Depression Paternal Grandmother    Seizures Paternal Grandmother    Family Psychiatric  History: See H&P.  Social History:  Social History   Substance and Sexual Activity  Alcohol Use Yes   Alcohol/week: 6.0 standard drinks of alcohol   Types: 6 Cans of beer per week   Comment: 6     Social History   Substance and Sexual Activity  Drug Use Yes   Types: Marijuana, Benzodiazepines, Cocaine   Comment: Occasional use approximately every 2-3 weeks    Social History   Socioeconomic History   Marital status: Divorced    Spouse name: Not on file   Number of children: Not on file   Years of education: Not on file   Highest education level: Not on file  Occupational History   Not on file  Tobacco Use   Smoking status: Every Day    Packs/day: 1.00    Years: 15.00    Additional pack years: 0.00    Total pack years: 15.00    Types: Cigarettes   Smokeless tobacco: Never   Tobacco comments:    x 12-13 years as of 10/08/17   Vaping Use   Vaping Use: Every day   Substances: Nicotine, THC, CBD  Substance and Sexual Activity   Alcohol use: Yes    Alcohol/week: 6.0 standard drinks of alcohol    Types: 6 Cans of beer per week    Comment: 6   Drug use: Yes    Types: Marijuana, Benzodiazepines, Cocaine    Comment: Occasional use approximately every 2-3 weeks   Sexual activity: Yes  Other Topics Concern   Not on file  Social History Narrative   No guns    Wears seat belt    Single   As of 03/15/20 drinking 2-4 beers daily and 1 ppd cigs   Likes golf   Social Determinants of Health   Financial Resource Strain: Not on file   Food Insecurity: No Food Insecurity (07/31/2022)   Hunger Vital Sign    Worried About Running Out of Food in the Last Year: Never true    Ran Out of Food in the Last Year: Never true  Transportation Needs: No Transportation Needs (07/31/2022)   PRAPARE -  Administrator, Civil Service (Medical): No    Lack of Transportation (Non-Medical): No  Physical Activity: Not on file  Stress: Not on file  Social Connections: Not on file   Additional Social History:   Sleep: Good  Appetite:  Good  Current Medications: Current Facility-Administered Medications  Medication Dose Route Frequency Provider Last Rate Last Admin   acetaminophen (TYLENOL) tablet 650 mg  650 mg Oral Q6H PRN Sunday Corn, NP       alum & mag hydroxide-simeth (MAALOX/MYLANTA) 200-200-20 MG/5ML suspension 30 mL  30 mL Oral Q4H PRN Sunday Corn, NP       diphenhydrAMINE (BENADRYL) capsule 50 mg  50 mg Oral TID PRN Sunday Corn, NP       Or   diphenhydrAMINE (BENADRYL) injection 50 mg  50 mg Intramuscular TID PRN Sunday Corn, NP       haloperidol (HALDOL) tablet 5 mg  5 mg Oral TID PRN Sunday Corn, NP       Or   haloperidol lactate (HALDOL) injection 5 mg  5 mg Intramuscular TID PRN Sunday Corn, NP       hydrOXYzine (ATARAX) tablet 25 mg  25 mg Oral Q6H PRN Sunday Corn, NP   25 mg at 08/01/22 2057   loperamide (IMODIUM) capsule 2-4 mg  2-4 mg Oral PRN Sunday Corn, NP       LORazepam (ATIVAN) tablet 2 mg  2 mg Oral TID PRN Sunday Corn, NP       Or   LORazepam (ATIVAN) injection 2 mg  2 mg Intramuscular TID PRN Sunday Corn, NP       LORazepam (ATIVAN) tablet 1 mg  1 mg Oral Q6H PRN Sunday Corn, NP       LORazepam (ATIVAN) tablet 1 mg  1 mg Oral TID Sunday Corn, NP   1 mg at 08/02/22 1617   Followed by   Melene Muller ON 08/03/2022] LORazepam (ATIVAN) tablet 1 mg  1 mg Oral BID Sunday Corn, NP       Followed by   Melene Muller ON 08/05/2022] LORazepam  (ATIVAN) tablet 1 mg  1 mg Oral Daily Sunday Corn, NP       magnesium hydroxide (MILK OF MAGNESIA) suspension 30 mL  30 mL Oral Daily PRN Sunday Corn, NP       mirtazapine (REMERON) tablet 15 mg  15 mg Oral QHS Hampton Cost, Nicole Kindred I, NP   15 mg at 08/01/22 2057   multivitamin with minerals tablet 1 tablet  1 tablet Oral Daily Sunday Corn, NP   1 tablet at 08/02/22 1610   nicotine (NICODERM CQ - dosed in mg/24 hours) patch 14 mg  14 mg Transdermal Daily Onuoha, Chinwendu V, NP   14 mg at 08/02/22 0829   ondansetron (ZOFRAN-ODT) disintegrating tablet 4 mg  4 mg Oral Q6H PRN Sunday Corn, NP       QUEtiapine (SEROQUEL) tablet 100 mg  100 mg Oral QHS Joel Cowin I, NP   100 mg at 08/01/22 2057   thiamine (Vitamin B-1) tablet 100 mg  100 mg Oral Daily Sunday Corn, NP   100 mg at 08/02/22 9604   Lab Results:  No results found for this or any previous visit (from the past 48 hour(s)).  Blood Alcohol level:  Lab Results  Component Value Date   ETH 95 (H) 07/31/2022   ETH 262 (H) 07/23/2022  Metabolic Disorder Labs: Lab Results  Component Value Date   HGBA1C 5.5 07/31/2022   MPG 111 07/31/2022   Lab Results  Component Value Date   PROLACTIN 18.4 07/31/2022   Lab Results  Component Value Date   CHOL 203 (H) 07/31/2022   TRIG 136 07/31/2022   HDL 79 07/31/2022   CHOLHDL 2.6 07/31/2022   VLDL 27 07/31/2022   LDLCALC 97 07/31/2022   LDLCALC 71 03/15/2020   Physical Findings: AIMS:  , ,  ,  ,    CIWA:  CIWA-Ar Total: 1 COWS:     Musculoskeletal: Strength & Muscle Tone: within normal limits Gait & Station: normal Patient leans: N/A  Psychiatric Specialty Exam:  Presentation  General Appearance:  Appropriate for Environment; Casual; Fairly Groomed  Eye Contact: Good  Speech: Clear and Coherent; Normal Rate  Speech Volume: Normal  Handedness: Right  Mood and Affect  Mood: -- (Some improvement noted)  Affect: Appropriate;  Congruent  Thought Process  Thought Processes: Coherent  Descriptions of Associations:Intact  Orientation:Full (Time, Place and Person)  Thought Content:Logical  History of Schizophrenia/Schizoaffective disorder:No  Duration of Psychotic Symptoms:N/A  Hallucinations:Hallucinations: None Description of Auditory Hallucinations: NA Description of Visual Hallucinations: NA  Ideas of Reference:None  Suicidal Thoughts:Suicidal Thoughts: No SI Active Intent and/or Plan: Without Intent; Without Plan; Without Means to Carry Out; Without Access to Means  Homicidal Thoughts:Homicidal Thoughts: No HI Passive Intent and/or Plan: Without Intent; Without Plan; Without Means to Carry Out; Without Access to Means  Sensorium  Memory: Immediate Good; Recent Good; Remote Good  Judgment: Fair  Insight: Fair  Executive Functions  Concentration: Good  Attention Span: Good  Recall: Dudley Major of Knowledge: Fair  Language: Good  Psychomotor Activity  Psychomotor Activity:Psychomotor Activity: Normal  Assets  Assets: Communication Skills; Desire for Improvement; Housing; Social Support; Resilience; Physical Health  Sleep  Sleep:Sleep: Good Number of Hours of Sleep: 7.5  Physical Exam: Physical Exam Vitals and nursing note reviewed.  HENT:     Nose: Nose normal.     Mouth/Throat:     Pharynx: Oropharynx is clear.  Cardiovascular:     Rate and Rhythm: Normal rate.     Pulses: Normal pulses.  Pulmonary:     Effort: Pulmonary effort is normal.  Genitourinary:    Comments: Deferred Musculoskeletal:        General: Normal range of motion.     Cervical back: Normal range of motion.  Skin:    General: Skin is warm and dry.  Neurological:     General: No focal deficit present.     Mental Status: He is alert and oriented to person, place, and time.    Review of Systems  Constitutional:  Negative for chills, diaphoresis and fever.  HENT:  Negative for congestion  and sore throat.   Respiratory:  Negative for cough, shortness of breath and wheezing.   Cardiovascular:  Negative for chest pain and palpitations.  Gastrointestinal:  Negative for abdominal pain, constipation, diarrhea, heartburn, nausea and vomiting.  Musculoskeletal:  Negative for joint pain and myalgias.  Neurological:  Negative for dizziness, tingling, tremors, sensory change, speech change, focal weakness, seizures, loss of consciousness, weakness and headaches.  Endo/Heme/Allergies:        Gabapentin  Psychiatric/Behavioral:  Positive for depression and substance abuse (Hx ETOH/Benzo use). Negative for hallucinations and memory loss. The patient has insomnia (Improving). The patient is not nervous/anxious.    Blood pressure 115/88, pulse 80, temperature 97.6 F (36.4 C), temperature  source Oral, resp. rate 16, height 5\' 9"  (1.753 m), weight 58.9 kg, SpO2 98 %. Body mass index is 19.18 kg/m.  Treatment Plan Summary: Daily contact with patient to assess and evaluate symptoms and progress in treatment and Medication management.   Continue inpatient hospitalization.  Will continue today 08/02/2022 plan as below except where it is noted.   Principal/active diagnoses.  MDD (major depressive disorder), recurrent, severe, with psychosis (HCC) Alcohol use disorder, chronic.  Benzodiazepine use disorder, chronic.  Cannabis use disorder.   Associated symptoms.  Suicidal ideations.  Homicidal ideations.  Plan:  -Continue the CIWA  detox protocols for alcohol/benzodiazepine withdrawal management. -Continue Mirtazapine 15 mg po Q hs for depression/insomnia.  -Continue Seroquel 100 mg po Q hs for mood stability.  -Continue Nicoderm patch 14 mg trans-dermally Q 24 hours for nicotine withdrawals.    Agitation protocols: Cont as recommended;  -Benadryl 50 mg po or IM tid prn. -Haldol 5 mg po or IM tid prn.  -Lorazepam 2 mg po or IM tid prn.  Other PRNS -Continue Tylenol 650 mg every 6  hours PRN for mild pain -Continue Maalox 30 ml Q 4 hrs PRN for indigestion -Continue MOM 30 ml po Q 6 hrs for constipation   Safety and Monitoring: Voluntary admission to inpatient psychiatric unit for safety, stabilization and treatment Daily contact with patient to assess and evaluate symptoms and progress in treatment Patient's case to be discussed in multi-disciplinary team meeting Observation Level : q15 minute checks Vital signs: q12 hours Precautions: Safety   Discharge Planning: Social work and case management to assist with discharge planning and identification of hospital follow-up needs prior to discharge Estimated LOS: 5-7 days Discharge Concerns: Need to establish a safety plan; Medication compliance and effectiveness Discharge Goals: Return home with outpatient referrals for mental health follow-up including medication management/psychotherapy  Armandina Stammer, NP, pmhnp, fnp-bc. 08/02/2022, 5:09 PM

## 2022-08-02 NOTE — Group Note (Signed)
Date:  08/02/2022 Time:  10:14 AM  Group Topic/Focus:  Goals Group:   The focus of this group is to help patients establish daily goals to achieve during treatment and discuss how the patient can incorporate goal setting into their daily lives to aide in recovery. Orientation:   The focus of this group is to educate the patient on the purpose and policies of crisis stabilization and provide a format to answer questions about their admission.  The group details unit policies and expectations of patients while admitted.    Participation Level:  Did Not Attend  Additional Comments:  Patient was encourage to attend group multiple times.   Taiwana Willison T Shandreka Dante 08/02/2022, 10:14 AM

## 2022-08-02 NOTE — Progress Notes (Signed)
   08/01/22 2100  Psych Admission Type (Psych Patients Only)  Admission Status Involuntary  Psychosocial Assessment  Patient Complaints Anxiety;Insomnia  Eye Contact Brief  Facial Expression Anxious;Flat  Affect Anxious  Speech Logical/coherent  Interaction Assertive  Motor Activity Restless  Appearance/Hygiene Unremarkable  Behavior Characteristics Anxious  Mood Anxious;Depressed  Thought Process  Coherency WDL  Content Preoccupation  Delusions None reported or observed  Perception WDL  Hallucination None reported or observed  Judgment Impaired  Confusion None  Danger to Self  Current suicidal ideation? Denies  Agreement Not to Harm Self Yes  Description of Agreement Verbal  Danger to Others  Danger to Others None reported or observed

## 2022-08-02 NOTE — [facility] Suicide Risk Assessment (Signed)
[facility] INPATIENT:  Family/Significant Other Suicide Prevention Education  Suicide Prevention Education:  Education Completed; 08-02-2022-(Father) [phone] Parker Laroche Sr.  has been identified by the patient as the family member/significant other with whom the patient will be residing, and identified as the person(s) who will aid the patient in the event of a mental health crisis (suicidal ideations/suicide attempt).  With written consent from the patient, the family member/significant other has been provided the following suicide prevention education, prior to the and/or following the discharge of the patient.  The suicide prevention education provided includes the following: Suicide risk factors Suicide prevention and interventions National Suicide Hotline telephone number [facility] assessment telephone number [facility] Emergency Assistance 911 [facility] and/or Residential Mobile Crisis Unit telephone number  Request made of family/significant other to: Remove weapons (e.g., guns, rifles, knives), all items previously/currently identified as safety concern.   Remove drugs/medications (over-the-counter, prescriptions, illicit drugs), all items previously/currently identified as a safety concern.  The family member/significant other verbalizes understanding of the suicide prevention education information provided.  The family member/significant other agrees to remove the items of safety concern listed above.  Bernhard Koskinen S Zackerie Sara 08/02/2022, 9:49 AM

## 2022-08-02 NOTE — Progress Notes (Signed)
   08/02/22 1016  Psych Admission Type (Psych Patients Only)  Admission Status Involuntary  Psychosocial Assessment  Patient Complaints Anxiety  Eye Contact Fair  Facial Expression Anxious  Affect Anxious  Speech Logical/coherent  Interaction Assertive  Motor Activity Restless  Appearance/Hygiene Unremarkable  Behavior Characteristics Cooperative;Appropriate to situation;Anxious  Mood Anxious  Thought Process  Coherency WDL  Content WDL  Delusions None reported or observed  Perception WDL  Hallucination None reported or observed  Judgment Impaired  Confusion None  Danger to Self  Current suicidal ideation? Denies  Agreement Not to Harm Self Yes  Description of Agreement verbal  Danger to Others  Danger to Others None reported or observed

## 2022-08-02 NOTE — [facility] Group Notes (Signed)
[facility] Group Notes:  (Nursing/MHT/Case Management/Adjunct)  Date:  08/02/2022  Time:  2000  Type of Therapy:   wrap up group  Participation Level:  Active  Participation Quality:  Appropriate, Attentive, Sharing, and Supportive  Affect:  Anxious  Cognitive:  Alert  Insight:  Improving  Engagement in Group:  Engaged  Modes of Intervention:  Clarification, Education, and Support  Summary of Progress/Problems: Positive thinking and positive change were discussed.   Marcille Buffy 08/02/2022, 9:27 PM

## 2022-08-02 NOTE — Plan of Care (Signed)
  Problem: Education: Goal: Knowledge of [facility] Education information/materials will improve Outcome: Progressing   Problem: Coping: Goal: Ability to identify and develop effective coping behavior will improve Outcome: Progressing   Problem: Self-Concept: Goal: Level of anxiety will decrease Outcome: Progressing

## 2022-08-02 NOTE — Group Note (Signed)
Date:  08/02/2022 Time:  12:33 PM  Group Topic/Focus:  Identifying Needs:   The focus of this group is to help patients identify their personal needs that have been historically problematic and identify healthy behaviors to address their needs. Managing Feelings:   The focus of this group is to identify what feelings patients have difficulty handling and develop a plan to handle them in a healthier way upon discharge. Overcoming Stress:   The focus of this group is to define stress and help patients assess their triggers.    Participation Level:  Minimal  Participation Quality:  Appropriate  Affect:  Appropriate  Cognitive:  Appropriate  Insight: Appropriate  Engagement in Group:  Limited  Modes of Intervention:  Clarification, Discussion, Exploration, and Support  Additional Comments:    Memory Dance Parker Wilson 08/02/2022, 12:33 PM

## 2022-08-02 NOTE — Progress Notes (Signed)
   08/02/22 0659  15 Minute Checks  Location Cafeteria  Visual Appearance Calm  Behavior Composed  Sleep (Behavioral Health Patients Only)  Calculate sleep? (Click Yes once per 24 hr at 0600 safety check) Yes  Documented sleep last 24 hours 5.75

## 2022-08-03 MED ORDER — HYDROXYZINE HCL 50 MG PO TABS
50.0000 mg | ORAL_TABLET | Freq: Four times a day (QID) | ORAL | Status: DC | PRN
Start: 2022-08-03 — End: 2022-08-03

## 2022-08-03 MED ORDER — HYDROXYZINE HCL 50 MG PO TABS
50.0000 mg | ORAL_TABLET | Freq: Four times a day (QID) | ORAL | Status: DC | PRN
  Administered 2022-08-03 – 2022-08-04 (×2): 50 mg via ORAL
  Filled 2022-08-03: qty 10
  Filled 2022-08-03 (×2): qty 1

## 2022-08-03 NOTE — Progress Notes (Signed)
   08/03/22 0000  Psych Admission Type (Psych Patients Only)  Admission Status Involuntary  Psychosocial Assessment  Patient Complaints None  Eye Contact Fair  Facial Expression Animated  Affect Appropriate to circumstance  Speech Logical/coherent  Interaction Assertive  Motor Activity Restless  Appearance/Hygiene Unremarkable  Behavior Characteristics Cooperative;Appropriate to situation  Mood Anxious  Thought Process  Coherency WDL  Content WDL  Delusions None reported or observed  Perception WDL  Hallucination None reported or observed  Judgment Impaired  Confusion None  Danger to Self  Current suicidal ideation? Denies  Agreement Not to Harm Self Yes  Description of Agreement verbal  Danger to Others  Danger to Others None reported or observed

## 2022-08-03 NOTE — Progress Notes (Incomplete)
D:  Patient's self inventory sheet, patient sleeps good, sleep medication helpful.  Good appetite, normal energy level, good concentration.  Denied depression and hopeless and anxiety 7.  Denied withdrawals.  Denied SI.  Denied physical problems.  Denied physical pain.  Goal is cope with anxiety.  Plans to work on Pharmacologist.  Does have discharge plans. A:  Medications administered per MD orders.  Emotional support and encouragement given patient. R:  Denied SI and HI, contracts for safety.  Denied A/V hallucinations.  Safety maintained with 15 minute checks.

## 2022-08-03 NOTE — [facility] Group Notes (Signed)
Spiritual care group on grief and loss facilitated by chaplain Katy Ezrael Sam, [facility]   Group Goal:   Support / Education around grief and loss   Members engage in facilitated group support and psycho-social education.   Group Description:   Following introductions and group rules, group members engaged in facilitated group dialog and support around topic of loss, with particular support around experiences of loss in their lives. Group Identified types of loss (relationships / self / things) and identified patterns, circumstances, and changes that precipitate losses. Reflected on thoughts / feelings around loss, normalized grief responses, and recognized variety in grief experience. Group noted Worden's four tasks of grief in discussion.   Group drew on Adlerian / Rogerian, narrative, MI,   Patient Progress: Did not attend.  

## 2022-08-03 NOTE — Progress Notes (Signed)
   08/03/22 2330  Psych Admission Type (Psych Patients Only)  Admission Status Involuntary  Psychosocial Assessment  Patient Complaints None  Eye Contact Fair  Facial Expression Animated  Affect Appropriate to circumstance  Speech Logical/coherent  Interaction Assertive  Motor Activity Fidgety  Appearance/Hygiene Unremarkable  Behavior Characteristics Appropriate to situation  Mood Anxious;Pleasant  Thought Process  Coherency WDL  Content WDL  Delusions None reported or observed  Perception WDL  Hallucination None reported or observed  Judgment Impaired  Confusion None  Danger to Self  Current suicidal ideation? Denies  Agreement Not to Harm Self Yes  Description of Agreement verbal  Danger to Others  Danger to Others None reported or observed

## 2022-08-03 NOTE — Plan of Care (Signed)
Nurse discussed anxiety with patient.  

## 2022-08-03 NOTE — Group Note (Signed)
Recreation Therapy Group Note   Group Topic:Other  Group Date: 08/03/2022 Start Time: 1305 End Time: 1347 Facilitators: Kaedan Richert-McCall, LRT,CTRS Location: [address] Dayroom   Activity Description/Intervention: Therapeutic Drumming. Patients with peers and staff were given the opportunity to engage in a leader facilitated [facility] Group Empowerment Drumming Circle with staff from the [employer], in partnership with [employer]. Teaching laboratory technician and trained [employer], Theodoro Doing leading with LRT observing and documenting intervention and pt response. This evidenced-based practice targets 7 areas of health and wellbeing in the human experience including: stress-reduction, exercise, self-expression, camaraderie/support, nurturing, spirituality, and music-making (leisure).   Goal Area(s) Addresses:  Patient will engage in pro-social way in music group.  Patient will follow directions of drum leader on the first prompt. Patient will demonstrate no behavioral issues during group.  Patient will identify if a reduction in stress level occurs as a result of participation in therapeutic drum circle.    Affect/Mood: N/A   Participation Level: Did not attend    Clinical Observations/Individualized Feedback:    Plan: Continue to engage patient in RT group sessions 2-3x/week.   Syrena Burges-McCall, LRT,CTRS 08/03/2022 2:22 PM

## 2022-08-03 NOTE — Progress Notes (Signed)
[facility] MD Progress Note  08/03/2022 2:43 PM Parker Wilson.  MRN:  [id number]  Subjective:  47 year old Caucasian male with hx of alcohol use disorder, Benzodiazepine use disorder, anxiety disorder, panic disorder & Wilson depressive disorder. He is admitted to the [facility] from the [facility] with complaint of suicidal/homicidal ideations, worsening depressive & anxiety symptoms with psychosis. Chart review indicated patient was admitted/treated at the [facility] in January of 2022 with similar complaints. He was discharged on Melatonin & Trazodone. On this present admission, patient's toxicology/UDS results showed his BAL was 95 & UDS was positive for Benzodiazepine & cannabis.   Daily notes: Parker Wilson is seen is seen this morning. Chart reviewed. The chart findings discussed with the treatment team. He presents alert, oriented & aware of situation. He is making a good eye contact & verbally responsive. He presents with a good affect. He reports, "I'm doing well, just having a little anxiety this morning. I slept for 6 hours last night. I mean uninterrupted sleep. However, I have a roommate who snores coupled with the staff opening the door from time to time during their 15 minutes checks. I'm feeling like myself again. I feel like I'm ready for discharge. I'm wondering if I can be discharged tomorrow morning because I feel I can take it from there?" Parker Wilson currently denies any SIHI, AVH, delusional thoughts or paranoia. He does not appear to be responding to any internal stimuli. Discussed this case with the attending psychiatrist. Continue with the current plan of care as already in progress.  Collateral information obtained, provided by patient's father Parker Wilson via the telephone [phone]. Parker Wilson reports, "I have already talked to a SW worker today. So, here is what I can tell you. I think Parker Wilson is improving. I came to visit him last night, he looked well, relaxed & presents a lot better than I have seen in the last few  months. We did talk & he sounded a lot better. I have also talked to him today & he sounded good. What ever medicine you all gave him that helped him sleep, please discharge him on it. Parker Wilson was started on Xanax when he was too young, then when they stopped it, there were no alternative medicine recommended. He has gone to several places seeking help, but no one was able to help him. It is good to see Parker Wilson the way I know him & he can be very funny when he is feeling well. Ps, call me to let me know the possible discharge date because he will probably return home with me for few weeks to assure that he is doing well on his medicines. I thank you all for working with him.  Principal Problem: MDD (Wilson depressive disorder), recurrent, severe, with psychosis (HCC)  Diagnosis: Principal Problem:   MDD (Wilson depressive disorder), recurrent, severe, with psychosis (HCC) Active Problems:   Severe benzodiazepine use disorder (HCC)   Cannabis use disorder  Total Time spent with patient:  35 minutes  Past Psychiatric History: See H&P.  Past Medical History:  Past Medical History:  Diagnosis Date   Anxiety    Chronic back pain    Chronic headaches    COVID-19    2021   Depression    Insomnia    Scoliosis    Substance abuse (HCC)    uds +cocaine, meth, thc and benzos and +etoh c/o uds 03/30/20   Vitamin D deficiency     Past Surgical History:  Procedure Laterality  Date   tubes in ears     age 5 or 47 y.o    Family History:  Family History  Problem Relation Age of Onset   Heart disease Father        cabg x 3    Hypertension Father    Arthritis Father    Depression Father    Hyperlipidemia Father    Crohn's disease Father    Hypertension Brother    Alcohol abuse Brother    Depression Brother    Hyperlipidemia Brother    Cancer Paternal Aunt        breast cancer   Cancer Paternal Grandfather        Lung cancer   Alcohol abuse Paternal Grandfather    Arthritis Paternal  Grandfather    COPD Paternal Grandfather    Early death Paternal Grandfather    Heart disease Paternal Grandfather        cabg x 4    Hyperlipidemia Paternal Grandfather    Hypertension Paternal Grandfather    Other Paternal Grandfather        died covid June 11, 2019   Arthritis Mother        RA   Arthritis Paternal Grandmother    Depression Paternal Grandmother    Seizures Paternal Grandmother    Family Psychiatric  History: See H&P.  Social History:  Social History   Substance and Sexual Activity  Alcohol Use Yes   Alcohol/week: 6.0 standard drinks of alcohol   Types: 6 Cans of beer per week   Comment: 6     Social History   Substance and Sexual Activity  Drug Use Yes   Types: Marijuana, Benzodiazepines, Cocaine   Comment: Occasional use approximately every 2-3 weeks    Social History   Socioeconomic History   Marital status: Divorced    Spouse name: Not on file   Number of children: Not on file   Years of education: Not on file   Highest education level: Not on file  Occupational History   Not on file  Tobacco Use   Smoking status: Every Day    Packs/day: 1.00    Years: 15.00    Additional pack years: 0.00    Total pack years: 15.00    Types: Cigarettes   Smokeless tobacco: Never   Tobacco comments:    x 12-13 years as of 10/08/17   Vaping Use   Vaping Use: Every day   Substances: Nicotine, THC, CBD  Substance and Sexual Activity   Alcohol use: Yes    Alcohol/week: 6.0 standard drinks of alcohol    Types: 6 Cans of beer per week    Comment: 6   Drug use: Yes    Types: Marijuana, Benzodiazepines, Cocaine    Comment: Occasional use approximately every 2-3 weeks   Sexual activity: Yes  Other Topics Concern   Not on file  Social History Narrative   No guns    Wears seat belt    Single   As of 03/15/20 drinking 2-4 beers daily and 1 ppd cigs   Likes golf   Social Determinants of Health   Financial Resource Strain: Not on file  Food Insecurity: No Food  Insecurity (07/31/2022)   Hunger Vital Sign    Worried About Running Out of Food in the Last Year: Never true    Ran Out of Food in the Last Year: Never true  Transportation Needs: No Transportation Needs (07/31/2022)   PRAPARE - Transportation    Lack of  Transportation (Medical): No    Lack of Transportation (Non-Medical): No  Physical Activity: Not on file  Stress: Not on file  Social Connections: Not on file   Additional Social History:   Sleep: Good  Appetite:  Good  Current Medications: Current Facility-Administered Medications  Medication Dose Route Frequency Provider Last Rate Last Admin   acetaminophen (TYLENOL) tablet 650 mg  650 mg Oral Q6H PRN Sunday Corn, NP       alum & mag hydroxide-simeth (MAALOX/MYLANTA) 200-200-20 MG/5ML suspension 30 mL  30 mL Oral Q4H PRN Sunday Corn, NP       diphenhydrAMINE (BENADRYL) capsule 50 mg  50 mg Oral TID PRN Sunday Corn, NP       Or   diphenhydrAMINE (BENADRYL) injection 50 mg  50 mg Intramuscular TID PRN Sunday Corn, NP       haloperidol (HALDOL) tablet 5 mg  5 mg Oral TID PRN Sunday Corn, NP       Or   haloperidol lactate (HALDOL) injection 5 mg  5 mg Intramuscular TID PRN Sunday Corn, NP       hydrOXYzine (ATARAX) tablet 25 mg  25 mg Oral Q6H PRN Sunday Corn, NP   25 mg at 08/03/22 1312   loperamide (IMODIUM) capsule 2-4 mg  2-4 mg Oral PRN Sunday Corn, NP       LORazepam (ATIVAN) tablet 2 mg  2 mg Oral TID PRN Sunday Corn, NP       Or   LORazepam (ATIVAN) injection 2 mg  2 mg Intramuscular TID PRN Sunday Corn, NP       LORazepam (ATIVAN) tablet 1 mg  1 mg Oral Q6H PRN Sunday Corn, NP       LORazepam (ATIVAN) tablet 1 mg  1 mg Oral BID Sunday Corn, NP       Followed by   Melene Muller ON 08/05/2022] LORazepam (ATIVAN) tablet 1 mg  1 mg Oral Daily Sunday Corn, NP       magnesium hydroxide (MILK OF MAGNESIA) suspension 30 mL  30 mL Oral Daily PRN Sunday Corn,  NP       mirtazapine (REMERON) tablet 15 mg  15 mg Oral QHS Dorthie Santini, Nicole Kindred I, NP   15 mg at 08/02/22 2105   multivitamin with minerals tablet 1 tablet  1 tablet Oral Daily Sunday Corn, NP   1 tablet at 08/03/22 1610   nicotine (NICODERM CQ - dosed in mg/24 hours) patch 14 mg  14 mg Transdermal Daily Onuoha, Chinwendu V, NP   14 mg at 08/03/22 0825   ondansetron (ZOFRAN-ODT) disintegrating tablet 4 mg  4 mg Oral Q6H PRN Sunday Corn, NP       QUEtiapine (SEROQUEL) tablet 100 mg  100 mg Oral QHS Nathalya Wolanski I, NP   100 mg at 08/02/22 2105   thiamine (Vitamin B-1) tablet 100 mg  100 mg Oral Daily Sunday Corn, NP   100 mg at 08/03/22 0830   Lab Results:  No results found for this or any previous visit (from the past 48 hour(s)).  Blood Alcohol level:  Lab Results  Component Value Date   ETH 95 (H) 07/31/2022   ETH 262 (H) 07/23/2022   Metabolic Disorder Labs: Lab Results  Component Value Date   HGBA1C 5.5 07/31/2022   MPG 111 07/31/2022   Lab Results  Component Value Date   PROLACTIN 18.4 07/31/2022  Lab Results  Component Value Date   CHOL 203 (H) 07/31/2022   TRIG 136 07/31/2022   HDL 79 07/31/2022   CHOLHDL 2.6 07/31/2022   VLDL 27 07/31/2022   LDLCALC 97 07/31/2022   LDLCALC 71 03/15/2020   Physical Findings: AIMS:  , ,  ,  ,    CIWA:  CIWA-Ar Total: 1 COWS:     Musculoskeletal: Strength & Muscle Tone: within normal limits Gait & Station: normal Patient leans: N/A  Psychiatric Specialty Exam:  Presentation  General Appearance:  Appropriate for Environment; Casual; Fairly Groomed  Eye Contact: Good  Speech: Clear and Coherent; Normal Rate  Speech Volume: Normal  Handedness: Right  Mood and Affect  Mood: -- (Some improvement noted)  Affect: Appropriate; Congruent  Thought Process  Thought Processes: Coherent  Descriptions of Associations:Intact  Orientation:Full (Time, Place and Person)  Thought Content:Logical  History  of Schizophrenia/Schizoaffective disorder:No  Duration of Psychotic Symptoms:N/A  Hallucinations:Hallucinations: None Description of Auditory Hallucinations: NA Description of Visual Hallucinations: NA  Ideas of Reference:None  Suicidal Thoughts:Suicidal Thoughts: No SI Active Intent and/or Plan: Without Intent; Without Plan; Without Means to Carry Out; Without Access to Means  Homicidal Thoughts:Homicidal Thoughts: No HI Passive Intent and/or Plan: Without Intent; Without Plan; Without Means to Carry Out; Without Access to Means  Sensorium  Memory: Immediate Good; Recent Good; Remote Good  Judgment: Fair  Insight: Fair  Executive Functions  Concentration: Good  Attention Span: Good  Recall: Parker Wilson of Knowledge: Fair  Language: Good  Psychomotor Activity  Psychomotor Activity:Psychomotor Activity: Normal  Assets  Assets: Communication Skills; Desire for Improvement; Housing; Social Support; Resilience; Physical Health  Sleep  Sleep:Sleep: Good Number of Hours of Sleep: 7.5  Physical Exam: Physical Exam Vitals and nursing note reviewed.  HENT:     Nose: Nose normal.     Mouth/Throat:     Pharynx: Oropharynx is clear.  Cardiovascular:     Rate and Rhythm: Normal rate.     Pulses: Normal pulses.  Pulmonary:     Effort: Pulmonary effort is normal.  Genitourinary:    Comments: Deferred Musculoskeletal:        General: Normal range of motion.     Cervical back: Normal range of motion.  Skin:    General: Skin is warm and dry.  Neurological:     General: No focal deficit present.     Mental Status: He is alert and oriented to person, place, and time.    Review of Systems  Constitutional:  Negative for chills, diaphoresis and fever.  HENT:  Negative for congestion and sore throat.   Respiratory:  Negative for cough, shortness of breath and wheezing.   Cardiovascular:  Negative for chest pain and palpitations.  Gastrointestinal:  Negative  for abdominal pain, constipation, diarrhea, heartburn, nausea and vomiting.  Musculoskeletal:  Negative for joint pain and myalgias.  Neurological:  Negative for dizziness, tingling, tremors, sensory change, speech change, focal weakness, seizures, loss of consciousness, weakness and headaches.  Endo/Heme/Allergies:        Gabapentin  Psychiatric/Behavioral:  Positive for depression (Improving) and substance abuse (Hx ETOH/Benzo use). Negative for hallucinations and memory loss. The patient has insomnia (Improving). The patient is not nervous/anxious.    Blood pressure (!) 138/90, pulse 79, temperature 98.4 F (36.9 C), temperature source Oral, resp. rate 18, height 5\' 9"  (1.753 m), weight 58.9 kg, SpO2 99 %. Body mass index is 19.18 kg/m.  Treatment Plan Summary: Daily contact with patient to assess  and evaluate symptoms and progress in treatment and Medication management.   Continue inpatient hospitalization.  Will continue today 08/03/2022 plan as below except where it is noted.   Principal/active diagnoses.  MDD (Wilson depressive disorder), recurrent, severe, with psychosis (HCC) Alcohol use disorder, chronic.  Benzodiazepine use disorder, chronic.  Cannabis use disorder.   Associated symptoms.  Suicidal ideations.  Homicidal ideations.  Plan:  -Continue the CIWA  detox protocols for alcohol/benzodiazepine withdrawal management. -Continue Mirtazapine 15 mg po Q hs for depression/insomnia.  -Continue Seroquel 100 mg po Q hs for mood stability.  -Continue Nicoderm patch 14 mg trans-dermally Q 24 hours for nicotine withdrawals.  -Continue Hydroxyzine 50 mg po qid prn for anxiety.   Agitation protocols: Cont as recommended;  -Benadryl 50 mg po or IM tid prn. -Haldol 5 mg po or IM tid prn.  -Lorazepam 2 mg po or IM tid prn.  Other PRNS -Continue Tylenol 650 mg every 6 hours PRN for mild pain -Continue Maalox 30 ml Q 4 hrs PRN for indigestion -Continue MOM 30 ml po Q 6 hrs for  constipation   Safety and Monitoring: Voluntary admission to inpatient psychiatric unit for safety, stabilization and treatment Daily contact with patient to assess and evaluate symptoms and progress in treatment Patient's case to be discussed in multi-disciplinary team meeting Observation Level : q15 minute checks Vital signs: q12 hours Precautions: Safety   Discharge Planning: Social work and case management to assist with discharge planning and identification of hospital follow-up needs prior to discharge Estimated LOS: 5-7 days Discharge Concerns: Need to establish a safety plan; Medication compliance and effectiveness Discharge Goals: Return home with outpatient referrals for mental health follow-up including medication management/psychotherapy  Armandina Stammer, NP, pmhnp, fnp-bc. 08/03/2022, 2:43 PMPatient ID: Parker Wilson., male   DOB: March 01, 1976, 47 y.o.   MRN: [id number]

## 2022-08-04 DIAGNOSIS — F333 Major depressive disorder, recurrent, severe with psychotic symptoms: Principal | ICD-10-CM

## 2022-08-04 MED ORDER — MIRTAZAPINE 15 MG PO TABS: 15.0000 mg | ORAL_TABLET | Freq: Every day | ORAL | 0 refills | Status: AC

## 2022-08-04 MED ORDER — NICOTINE 14 MG/24HR TD PT24: 14.0000 mg | MEDICATED_PATCH | Freq: Every day | TRANSDERMAL | 0 refills | Status: AC

## 2022-08-04 MED ORDER — QUETIAPINE FUMARATE 100 MG PO TABS: 100.0000 mg | ORAL_TABLET | Freq: Every day | ORAL | 0 refills | Status: AC

## 2022-08-04 MED ORDER — HYDROXYZINE HCL 50 MG PO TABS: 50.0000 mg | ORAL_TABLET | Freq: Four times a day (QID) | ORAL | 0 refills | Status: AC | PRN

## 2022-08-04 MED ORDER — HYDROXYZINE HCL 50 MG PO TABS: 50.0000 mg | ORAL_TABLET | Freq: Four times a day (QID) | ORAL | 0 refills | Status: DC | PRN

## 2022-08-04 MED ORDER — MIRTAZAPINE 15 MG PO TABS: 15.0000 mg | ORAL_TABLET | Freq: Every day | ORAL | 0 refills | Status: DC

## 2022-08-04 MED ORDER — QUETIAPINE FUMARATE 100 MG PO TABS: 100.0000 mg | ORAL_TABLET | Freq: Every day | ORAL | 0 refills | Status: DC

## 2022-08-04 NOTE — Progress Notes (Signed)
  [facility] Adult Case Management Discharge Plan :  Will you be returning to the same living situation after discharge:  Yes,  with father At discharge, do you have transportation home?: Yes,  father Do you have the ability to pay for your medications: Yes,  has insurance  Release of information consent forms completed and in the chart;  Patient's signature needed at discharge.  Patient to Follow up at:  Follow-up Information     [facility] Call in 2 day(s).   Why: Please call the office at [phone] to confirm your appointment of 08/07/2022 at 2pm.  Please have your identification, insurance card and list of medications with you at the appointment. Contact information: [address] [place] [address] [phone]                 Next level of care provider has access to [facility] Link:no  Safety Planning and Suicide Prevention discussed: Yes,  with father     Has patient been referred to the Quitline?: Patient refused referral for treatment  Patient has been referred for addiction treatment: Patient refused referral for treatment.  Marinda Elk, LCSW 08/04/2022, 10:24 AM

## 2022-08-04 NOTE — Discharge Summary (Signed)
Physician Discharge Summary Note  Patient:  Parker Wilson. is an 47 y.o., male  MRN:  [id number]  DOB:  Jul 06, 1975  Patient phone:  [phone] (home)   Patient address:   [address] [place] [address],   Total Time spent with patient:  Greater than 30 minutes  Date of Admission:  07/31/2022  Date of Discharge: 08-04-22  Reason for Admission: Worsening suicidal/homicidal ideations, worsening depressive & anxiety symptoms with psychosis.  Principal Problem: MDD (major depressive disorder), recurrent, severe, with psychosis (HCC) Discharge Diagnoses: Principal Problem:   MDD (major depressive disorder), recurrent, severe, with psychosis (HCC) Active Problems:   Severe benzodiazepine use disorder (HCC)   Cannabis use disorder  Past Psychiatric History: See The lists above.  Past Medical History:  Past Medical History:  Diagnosis Date   Anxiety    Chronic back pain    Chronic headaches    COVID-19    2021   Depression    Insomnia    Scoliosis    Substance abuse (HCC)    uds +cocaine, meth, thc and benzos and +etoh c/o uds 03/30/20   Vitamin D deficiency     Past Surgical History:  Procedure Laterality Date   tubes in ears     age 18 or 47 y.o    Family History:  Family History  Problem Relation Age of Onset   Heart disease Father        cabg x 3    Hypertension Father    Arthritis Father    Depression Father    Hyperlipidemia Father    Crohn's disease Father    Hypertension Brother    Alcohol abuse Brother    Depression Brother    Hyperlipidemia Brother    Cancer Paternal Aunt        breast cancer   Cancer Paternal Grandfather        Lung cancer   Alcohol abuse Paternal Grandfather    Arthritis Paternal Grandfather    COPD Paternal Grandfather    Early death Paternal Grandfather    Heart disease Paternal Grandfather        cabg x 4    Hyperlipidemia Paternal Grandfather    Hypertension Paternal Grandfather    Other Paternal Grandfather         died covid 06-01-2019   Arthritis Mother        RA   Arthritis Paternal Grandmother    Depression Paternal Grandmother    Seizures Paternal Grandmother    Family Psychiatric  History: See H&P.  Social History:  Social History   Substance and Sexual Activity  Alcohol Use Yes   Alcohol/week: 6.0 standard drinks of alcohol   Types: 6 Cans of beer per week   Comment: 6     Social History   Substance and Sexual Activity  Drug Use Yes   Types: Marijuana, Benzodiazepines, Cocaine   Comment: Occasional use approximately every 2-3 weeks    Social History   Socioeconomic History   Marital status: Divorced    Spouse name: Not on file   Number of children: Not on file   Years of education: Not on file   Highest education level: Not on file  Occupational History   Not on file  Tobacco Use   Smoking status: Every Day    Packs/day: 1.00    Years: 15.00    Additional pack years: 0.00    Total pack years: 15.00    Types: Cigarettes   Smokeless tobacco:  Never   Tobacco comments:    x 12-13 years as of 10/08/17   Vaping Use   Vaping Use: Every day   Substances: Nicotine, THC, CBD  Substance and Sexual Activity   Alcohol use: Yes    Alcohol/week: 6.0 standard drinks of alcohol    Types: 6 Cans of beer per week    Comment: 6   Drug use: Yes    Types: Marijuana, Benzodiazepines, Cocaine    Comment: Occasional use approximately every 2-3 weeks   Sexual activity: Yes  Other Topics Concern   Not on file  Social History Narrative   No guns    Wears seat belt    Single   As of 03/15/20 drinking 2-4 beers daily and 1 ppd cigs   Likes golf   Social Determinants of Health   Financial Resource Strain: Not on file  Food Insecurity: No Food Insecurity (07/31/2022)   Hunger Vital Sign    Worried About Running Out of Food in the Last Year: Never true    Ran Out of Food in the Last Year: Never true  Transportation Needs: No Transportation Needs (07/31/2022)   PRAPARE -  Administrator, Civil Service (Medical): No    Lack of Transportation (Non-Medical): No  Physical Activity: Not on file  Stress: Not on file  Social Connections: Not on file   Hospital Course: (Per admission evaluation notes): This is the first admission/evaluation in this [facility] for this 47 year old Caucasian male with hx of alcohol use disorder, Benzodiazepine use disorder, anxiety disorder, panic disorder & major depressive disorder. He is admitted to the [facility] from the [facility] with complaint of suicidal/homicidal ideations, worsening depressive & anxiety symptoms with psychosis. Chart review indicated patient was admitted/treated at the [facility] in January of 2022 with similar complaints. He was discharged on Melatonin & Trazodone. On this present admission, patient's toxicology/UDS results showed his BAL was 95 & UDS was positive for Benzodiazepine & cannabis.   Prior to this discharge, Parker Wilson was seen & evaluated for mental health stability. The current laboratory findings were reviewed (stable), nurses notes & vital signs were reviewed as well. There are no current mental health or medical issues that should prevent this discharge at this time. Patient is being discharged to continue mental health care/medication management as noted below.   After the above admission evaluation, it was recommended based on his presenting symptoms that Parker Wilson will benefit from alcohol/benzodiazepine detoxification & mood stabilization treatments. Although he was upset/angry that his hope/agreement for coming to this hospital was to be put back on Xanax as his issues were neither depression nor substance abuse, rather anxiety/panic symptoms & extreme insomnia. At the time, he was demanding to be discharged as he believed there was nothing that will be done here that will benefit him. It took a lot of convincing/patience for him to agree to try other medications that could potentially help him. As he reluctantly  consented to treatment. Parker Wilson was started on the medication regimen that purposefully targeted his symptoms (insomnia, severe anxiety/some depression). He was also started on the CIWA detoxification protocols for alcohol/benzodiazepine withdrawal management using ativan regimen on a tapering dose format. He was instructed & explained the benefit/adverse effects of the medication in use. He was given the time to ask questions and voice any concerns that he may have. He received, stabilized & was discharged on the medications as listed below on his discharge medication lists. He was also enrolled & participated in  the group counseling sessions being offered and held on this unit. Parker Wilson learned coping skills that should help him after discharge to cope better & maintain mood stability/sobriety.  To his widest surprise, Parker Wilson's symptoms responded well to his treatment regimen. This is evidenced by his daily reports of improving anxiety, sleep, mood & absence of suicidal ideations/homicidal ideations. He stated that he has been to so many psychiatrists/clinics, but no one was willing to help him. And to everyone else's amazement, Aeron was thanking the staff for encouraging him to stay to try out other medications for his symptoms. He states for about a month or more that he not been able to sleep, he has had the best sleep of his life in this last few days of being in this hospital. Even his father was impressed & voiced how good his son looked when he visited & how good he sounded during their phone conversations. Parker Wilson is currently mentally & medically stable to be discharged to continue mental health care, medication management & substance abuse treatment as noted below. He is provided with all the necessary information needed to make this appointment without problems.   During the course of his hospitalization, the 15-minute checks were adequate to ensure patient's safety. Patient did not display any dangerous,  violent or suicidal behavior on the unit.  He interacted with the other patients & staff appropriately and participated appropriately in the group therapy sessions.  His medications were addressed & adjusted to meet his needs.  At the time of discharge,  patient is not reporting any acute suicidal ideation & feels more confident about his self-care and managing his anxiety moving forward. He denies  any suicidal/homicidal ideations.  Education & supportive counseling provided throughout his hospital stay & upon this discharge. Parker Wilson was able to engage in safety planning including plan to return to [facility] or contact emergency services if he feels unable to maintain his own safety or the safety of others. Pt had no further questions, comments, or concerns. [facility] left [facility] with all personal belongings in no apparent distress. Transportation per his father.  Physical Findings: AIMS:  , ,  ,  ,    CIWA:  CIWA-Ar Total: 2 COWS:     Musculoskeletal: Strength & Muscle Tone: within normal limits Gait & Station: normal Patient leans: N/A  Psychiatric Specialty Exam:  Presentation  General Appearance:  Appropriate for Environment; Casual; Fairly Groomed  Eye Contact: Good  Speech: Clear and Coherent; Normal Rate  Speech Volume: Normal  Handedness: Right   Mood and Affect  Mood: Euthymic  Affect: Appropriate; Congruent   Thought Process  Thought Processes: Coherent; Goal Directed; Linear  Descriptions of Associations:Intact  Orientation:Full (Time, Place and Person)  Thought Content:Logical  History of Schizophrenia/Schizoaffective disorder:No  Duration of Psychotic Symptoms:N/A  Hallucinations:Hallucinations: None Description of Auditory Hallucinations: NA Description of Visual Hallucinations: NA  Ideas of Reference:None  Suicidal Thoughts:Suicidal Thoughts: No SI Active Intent and/or Plan: Without Intent; Without Plan; Without Means to Carry Out; Without Access to  Means  Homicidal Thoughts:Homicidal Thoughts: No HI Passive Intent and/or Plan: Without Intent; Without Plan; Without Means to Carry Out; Without Access to Means  Sensorium  Memory: Immediate Good; Recent Good; Remote Good  Judgment: Good  Insight: Good; Present  Executive Functions  Concentration: Good  Attention Span: Good  Recall: Good  Fund of Knowledge: Fair  Language: Good  Psychomotor Activity  Psychomotor Activity: Psychomotor Activity: Normal  Assets  Assets: Communication Skills; Desire for Improvement; Housing;  Resilience; Social Support; Physical Health; Transportation  Sleep  Sleep: Sleep: Good Number of Hours of Sleep: 7.5  Physical Exam: Physical Exam Vitals and nursing note reviewed.  HENT:     Head: Normocephalic.     Nose: Nose normal.     Mouth/Throat:     Pharynx: Oropharynx is clear.  Cardiovascular:     Rate and Rhythm: Normal rate.     Pulses: Normal pulses.  Pulmonary:     Effort: Pulmonary effort is normal.  Genitourinary:    Comments: Deferred Musculoskeletal:        General: Normal range of motion.     Cervical back: Normal range of motion.  Skin:    General: Skin is warm and dry.  Neurological:     General: No focal deficit present.     Mental Status: He is alert and oriented to person, place, and time. Mental status is at baseline.   Review of Systems  Constitutional:  Negative for chills, diaphoresis, fever and malaise/fatigue.  HENT:  Negative for congestion and sore throat.   Respiratory:  Negative for cough, shortness of breath and wheezing.   Cardiovascular:  Negative for chest pain and palpitations.  Gastrointestinal:  Negative for abdominal pain, constipation, diarrhea, heartburn, nausea and vomiting.  Genitourinary:  Negative for dysuria.  Musculoskeletal:  Negative for joint pain and myalgias.  Skin:  Negative for itching and rash.  Neurological:  Negative for dizziness, tingling, tremors, sensory  change, speech change, seizures, loss of consciousness, weakness and headaches.  Endo/Heme/Allergies:        Gabapentin  Psychiatric/Behavioral:  Positive for substance abuse (Hx benzodizepine/alcohol use disorders.). Negative for depression, hallucinations, memory loss and suicidal ideas. The patient has insomnia (Hx of (stable on medication).). The patient is not nervous/anxious (Hx of (stable upon discharge).).    Blood pressure 104/84, pulse 95, temperature 98.4 F (36.9 C), temperature source Oral, resp. rate 20, height 5\' 9"  (1.753 m), weight 58.9 kg, SpO2 100 %. Body mass index is 19.18 kg/m.   Social History   Tobacco Use  Smoking Status Every Day   Packs/day: 1.00   Years: 15.00   Additional pack years: 0.00   Total pack years: 15.00   Types: Cigarettes  Smokeless Tobacco Never  Tobacco Comments   x 12-13 years as of 10/08/17    Tobacco Cessation:  A prescription for an FDA-approved tobacco cessation medication provided at discharge  Blood Alcohol level:  Lab Results  Component Value Date   ETH 95 (H) 07/31/2022   ETH 262 (H) 07/23/2022    Metabolic Disorder Labs:  Lab Results  Component Value Date   HGBA1C 5.5 07/31/2022   MPG 111 07/31/2022   Lab Results  Component Value Date   PROLACTIN 18.4 07/31/2022   Lab Results  Component Value Date   CHOL 203 (H) 07/31/2022   TRIG 136 07/31/2022   HDL 79 07/31/2022   CHOLHDL 2.6 07/31/2022   VLDL 27 07/31/2022   LDLCALC 97 07/31/2022   LDLCALC 71 03/15/2020   See Psychiatric Specialty Exam and Suicide Risk Assessment completed by Attending Physician prior to discharge.  Discharge destination:  Home  Is patient on multiple antipsychotic therapies at discharge:  No   Has Patient had three or more failed trials of antipsychotic monotherapy by history:  No  Recommended Plan for Multiple Antipsychotic Therapies: NA  Allergies as of 08/04/2022       Reactions   Gabapentin Nausea Only        Medication  List      STOP taking these medications    multivitamin with minerals Tabs tablet   traZODone 100 MG tablet Commonly known as: DESYREL       TAKE these medications      Indication  hydrOXYzine 50 MG tablet Commonly known as: ATARAX Take 1 tablet (50 mg total) by mouth every 6 (six) hours as needed for anxiety.  Indication: Feeling Anxious   ibuprofen 200 MG tablet Commonly known as: ADVIL Take 800 mg by mouth every 6 (six) hours as needed (For pain).  Indication: Fever, Pain   mirtazapine 15 MG tablet Commonly known as: REMERON Take 1 tablet (15 mg total) by mouth at bedtime. For depression/sleep  Indication: Major Depressive Disorder, Insomnia.   nicotine 14 mg/24hr patch Commonly known as: NICODERM CQ - dosed in mg/24 hours Place 1 patch (14 mg total) onto the skin daily. (May buy from over the counter): For smoking cessation. Start taking on: August 05, 2022  Indication: Nicotine Addiction   QUEtiapine 100 MG tablet Commonly known as: SEROQUEL Take 1 tablet (100 mg total) by mouth at bedtime. For mood stabilization/sleep  Indication: Mood stabilization/insomnia        Follow-up Information     [facility] Call in 2 day(s).   Why: Please call the office at [phone] to confirm your appointment of 08/07/2022 at 2pm.  Please have your identification, insurance card and list of medications with you at the appointment. Contact information: [address] [place] [address] [phone]               Follow-up recommendations: Activity:  As tolerated Diet: As recommended by your primary care doctor. Keep all scheduled follow-up appointments as recommended.   Comments: Comments: Patient is recommended to follow-up care on an outpatient basis as noted above. Prescriptions sent to pt's pharmacy of choice at discharge.   Patient agreeable to plan.   Given opportunity to ask questions.   Appears to feel comfortable with discharge denies any current  suicidal or homicidal thought. Patient is also instructed prior to discharge to: Take all medications as prescribed by his/her mental healthcare provider. Report any adverse effects and or reactions from the medicines to his/her outpatient provider promptly. Patient has been instructed & cautioned: To not engage in alcohol and or illegal drug use while on prescription medicines. In the event of worsening symptoms, patient is instructed to call the crisis hotline, 911 and or go to the nearest ED for appropriate evaluation and treatment of symptoms. To follow-up with his/her primary care provider for your other medical issues, concerns and or health care needs.  Signed: Armandina Stammer, NP, pmhnp, fnp-bc 08/04/2022, 11:33 AM

## 2022-08-04 NOTE — Plan of Care (Signed)
Nurse discussed anxiety with patient.  

## 2022-08-04 NOTE — Progress Notes (Signed)
Discharge Note:  Patient discharged home with family member.  Suicide prevention information given and discussed with patient who stated he understood and had no questions.  Denied SI and HI.  Denied A/V hallucinations.  Denied pain.  Patient stated he received all his belongings, clothing, toiletries, misc items, etc.  Patient stated he appreciated all assistance received from [facility] staff.  All required discharge information given.

## 2022-08-04 NOTE — [facility] Suicide Risk Assessment (Signed)
Suicide Risk Assessment  Discharge Assessment    [facility] Discharge Suicide Risk Assessment   Principal Problem: MDD (major depressive disorder), recurrent, severe, with psychosis (HCC)  Discharge Diagnoses: Principal Problem:   MDD (major depressive disorder), recurrent, severe, with psychosis (HCC) Active Problems:   Severe benzodiazepine use disorder (HCC)   Cannabis use disorder  Total Time spent with patient:  Greater than 30 minutes  Musculoskeletal: Strength & Muscle Tone: within normal limits Gait & Station: normal Patient leans: N/A  Psychiatric Specialty Exam  Presentation  General Appearance:  Appropriate for Environment; Casual; Fairly Groomed  Eye Contact: Good  Speech: Clear and Coherent; Normal Rate  Speech Volume: Normal  Handedness: Right   Mood and Affect  Mood: Euthymic  Duration of Depression Symptoms: Greater than two weeks  Affect: Appropriate; Congruent   Thought Process  Thought Processes: Coherent; Goal Directed; Linear  Descriptions of Associations:Intact  Orientation:Full (Time, Place and Person)  Thought Content:Logical  History of Schizophrenia/Schizoaffective disorder:No  Duration of Psychotic Symptoms:N/A  Hallucinations:Hallucinations: None Description of Auditory Hallucinations: NA Description of Visual Hallucinations: NA  Ideas of Reference:None  Suicidal Thoughts:Suicidal Thoughts: No SI Active Intent and/or Plan: Without Intent; Without Plan; Without Means to Carry Out; Without Access to Means  Homicidal Thoughts:Homicidal Thoughts: No HI Passive Intent and/or Plan: Without Intent; Without Plan; Without Means to Carry Out; Without Access to Means   Sensorium  Memory: Immediate Good; Recent Good; Remote Good  Judgment: Good  Insight: Good; Present   Executive Functions  Concentration: Good  Attention Span: Good  Recall: Good  Fund of Knowledge: Fair  Language: Good   Psychomotor  Activity  Psychomotor Activity:Psychomotor Activity: Normal   Assets  Assets: Communication Skills; Desire for Improvement; Housing; Resilience; Social Support; Physical Health; Transportation   Sleep  Sleep:Sleep: Good Number of Hours of Sleep: 7.5   Physical Exam: Blood pressure 104/84, pulse 95, temperature 98.4 F (36.9 C), temperature source Oral, resp. rate 20, height 5\' 9"  (1.753 m), weight 58.9 kg, SpO2 100 %. Body mass index is 19.18 kg/m.  Mental Status Per Nursing Assessment::   On Admission:  NA  Demographic Factors:  Male, Adolescent or young adult, Caucasian, and Unemployed  Loss Factors: NA  Historical Factors: NA  Risk Reduction Factors:   Sense of responsibility to family, Living with another person, especially a relative, Positive social support, Positive therapeutic relationship, and Positive coping skills or problem solving skills  Continued Clinical Symptoms:  Severe Anxiety and/or Agitation Alcohol/Substance Abuse/Dependencies More than one psychiatric diagnosis Previous Psychiatric Diagnoses and Treatments  Cognitive Features That Contribute To Risk:  Closed-mindedness, Polarized thinking, and Thought constriction (tunnel vision)    Suicide Risk:  Minimal: No identifiable suicidal ideation.  Patients presenting with no risk factors but with morbid ruminations; may be classified as minimal risk based on the severity of the depressive symptoms   Follow-up Information     [facility] Call in 2 day(s).   Why: Please call the office at [phone] to confirm your appointment of 08/07/2022 at 2pm.  Please have your identification, insurance card and list of medications with you at the appointment. Contact information: [address] [place] [address] [phone]               Plan Of Care/Follow-up recommendations:  See discharge summary.  Armandina Stammer, NP, pmhnp, fnp-bc. 08/04/2022, 11:35 AM

## 2022-08-04 NOTE — Progress Notes (Signed)
D:  Patient denied SI and HI, contracts for safety.  Denied A/V hallucinations.  Denied pain. A:  Medications administered per MD orders.  Emotional support and encouragement given patient. R:  Safety maintained with 15 minute checks.  

## 2022-10-11 ENCOUNTER — Encounter: Payer: Self-pay | Admitting: Cardiology

## 2022-10-11 ENCOUNTER — Ambulatory Visit: Payer: 59 | Attending: Cardiology | Admitting: Cardiology

## 2022-10-11 VITALS — BP 112/78 | HR 70 | Ht 69.0 in | Wt 128.0 lb

## 2022-10-11 DIAGNOSIS — F17200 Nicotine dependence, unspecified, uncomplicated: Secondary | ICD-10-CM | POA: Diagnosis not present

## 2022-10-11 DIAGNOSIS — I4510 Unspecified right bundle-branch block: Secondary | ICD-10-CM | POA: Diagnosis not present

## 2022-10-11 NOTE — Patient Instructions (Signed)
Medication Instructions:   Your physician recommends that you continue on your current medications as directed. Please refer to the Current Medication list given to you today.   *If you need a refill on your cardiac medications before your next appointment, please call your pharmacy*   Lab Work:  None ordered  If you have labs (blood work) drawn today and your tests are completely normal, you will receive your results only by: [place] (if you have MyChart) OR A paper copy in the mail If you have any lab test that is abnormal or we need to change your treatment, we will call you to review the results.   Testing/Procedures:  Your physician has requested that you have an echocardiogram. Echocardiography is a painless test that uses sound waves to create images of your heart. It provides your doctor with information about the size and shape of your heart and how well your heart's chambers and valves are working. This procedure takes approximately one hour. There are no restrictions for this procedure. Please do NOT wear cologne, perfume, aftershave, or lotions (deodorant is allowed). Please arrive 15 minutes prior to your appointment time.    Follow-Up: At [facility], you and your health needs are our priority.  As part of our continuing mission to provide you with exceptional heart care, we have created designated Provider Care Teams.  These Care Teams include your primary Cardiologist (physician) and Advanced Practice Providers (APPs -  Physician Assistants and Nurse Practitioners) who all work together to provide you with the care you need, when you need it.  We recommend signing up for the patient portal called "MyChart".  Sign up information is provided on this After Visit Summary.  MyChart is used to connect with patients for Virtual Visits (Telemedicine).  Patients are able to view lab/test results, encounter notes, upcoming appointments, etc.  Non-urgent messages  can be sent to your provider as well.   To learn more about what you can do with MyChart, go to [email or web address].    Your next appointment:    As needed

## 2022-10-11 NOTE — Progress Notes (Signed)
Cardiology Office Note:    Date:  10/11/2022   ID:  Parker Spark., DOB [birth year], MRN [id number]  PCP:  Aviva Kluver    HeartCare Providers Cardiologist:  None     Referring MD: No ref. provider found   Chief Complaint  Patient presents with   New Patient (Initial Visit)    Recent abnormal EKG during ED visit.  Patient offers no cardiac symptoms.    History of Present Illness:    Parker Derienzo. is a 47 y.o. male with a hx of depression, anxiety, current smoker times 2+ years presenting due to abnormal EKG.  Patient was seen in the ED 2 months ago for psychiatric evaluation. EKG obtained showed incomplete right bundle branch block.  He denies chest pain or shortness of breath.  Endorses smoking, working on quitting.  Denies any personal history of heart disease.  Denies palpitations.  Was given propranolol which he took x 1 month with no refills due to abnormal EKG.  Denies dizziness, or syncope.  Past Medical History:  Diagnosis Date   Anxiety    Chronic back pain    Chronic headaches    COVID-19    2021   Depression    Insomnia    Scoliosis    Substance abuse (HCC)    uds +cocaine, meth, thc and benzos and +etoh c/o uds 03/30/20   Vitamin D deficiency     Past Surgical History:  Procedure Laterality Date   tubes in ears     age 89 or 47 y.o     Current Medications: Current Meds  Medication Sig   clonazePAM (KLONOPIN) 0.5 MG tablet Take 0.5 mg by mouth 2 (two) times daily as needed for anxiety.   ibuprofen (ADVIL) 200 MG tablet Take 800 mg by mouth every 6 (six) hours as needed (For pain).   mirtazapine (REMERON) 15 MG tablet Take 1 tablet (15 mg total) by mouth at bedtime. For depression/sleep (Patient taking differently: Take 30 mg by mouth at bedtime. For depression/sleep)   prazosin (MINIPRESS) 2 MG capsule Take 2 mg by mouth at bedtime.   propranolol ER (INDERAL LA) 60 MG 24 hr capsule Take 60 mg by mouth daily.   QUEtiapine (SEROQUEL) 100 MG tablet  Take 1 tablet (100 mg total) by mouth at bedtime. For mood stabilization/sleep (Patient taking differently: Take 200 mg by mouth at bedtime. For mood stabilization/sleep)     Allergies:   Gabapentin   Social History   Socioeconomic History   Marital status: Divorced    Spouse name: Not on file   Number of children: Not on file   Years of education: Not on file   Highest education level: Not on file  Occupational History   Not on file  Tobacco Use   Smoking status: Every Day    Current packs/day: 1.00    Average packs/day: 1 pack/day for 15.0 years (15.0 ttl pk-yrs)    Types: Cigarettes   Smokeless tobacco: Never   Tobacco comments:    x 12-13 years as of 10/08/17   Vaping Use   Vaping status: Every Day   Substances: Nicotine, THC, CBD  Substance and Sexual Activity   Alcohol use: Yes    Alcohol/week: 6.0 standard drinks of alcohol    Types: 6 Cans of beer per week    Comment: 6   Drug use: Yes    Types: Marijuana, Benzodiazepines, Cocaine    Comment: Occasional use approximately every 2-3 weeks  Sexual activity: Yes  Other Topics Concern   Not on file  Social History Narrative   No guns    Wears seat belt    Single   As of 03/15/20 drinking 2-4 beers daily and 1 ppd cigs   Likes golf   Social Determinants of Health   Financial Resource Strain: Not on file  Food Insecurity: No Food Insecurity (07/31/2022)   Hunger Vital Sign    Worried About Running Out of Food in the Last Year: Never true    Ran Out of Food in the Last Year: Never true  Transportation Needs: No Transportation Needs (07/31/2022)   PRAPARE - Administrator, Civil Service (Medical): No    Lack of Transportation (Non-Medical): No  Physical Activity: Not on file  Stress: Not on file  Social Connections: Not on file     Family History: The patient's family history includes Alcohol abuse in his brother and paternal grandfather; Arthritis in his father, mother, paternal grandfather, and  paternal grandmother; COPD in his paternal grandfather; Cancer in his paternal aunt and paternal grandfather; Crohn's disease in his father; Depression in his brother, father, and paternal grandmother; Early death in his paternal grandfather; Heart disease in his father and paternal grandfather; Hyperlipidemia in his brother, father, and paternal grandfather; Hypertension in his brother, father, and paternal grandfather; Other in his paternal grandfather; Seizures in his paternal grandmother.  ROS:   Please see the history of present illness.     All other systems reviewed and are negative.  EKGs/Labs/Other Studies Reviewed:    The following studies were reviewed today:  EKG Interpretation Date/Time:  Thursday October 11 2022 09:34:17 EDT Ventricular Rate:  70 PR Interval:  154 QRS Duration:  92 QT Interval:  362 QTC Calculation: 390 R Axis:   87  Text Interpretation: Normal sinus rhythm Incomplete right bundle branch block Confirmed by Debbe Odea ([id number]) on 10/11/2022 9:42:43 AM    Recent Labs: 07/31/2022: ALT 19; BUN 7; Creatinine, Ser 0.86; Hemoglobin 15.3; Magnesium 2.4; Platelets 332; Potassium 4.0; Sodium 136; TSH 2.631  Recent Lipid Panel    Component Value Date/Time   CHOL 203 (H) 07/31/2022 1509   TRIG 136 07/31/2022 1509   HDL 79 07/31/2022 1509   CHOLHDL 2.6 07/31/2022 1509   VLDL 27 07/31/2022 1509   LDLCALC 97 07/31/2022 1509     Risk Assessment/Calculations:             Physical Exam:    VS:  BP 112/78 (BP Location: Right Arm, Patient Position: Sitting, Cuff Size: Normal)   Pulse 70   Ht 5\' 9"  (1.753 m)   Wt 128 lb (58.1 kg)   SpO2 99%   BMI 18.90 kg/m     Wt Readings from Last 3 Encounters:  10/11/22 128 lb (58.1 kg)  07/31/22 129 lb 13.6 oz (58.9 kg)  07/23/22 129 lb 13.6 oz (58.9 kg)     GEN:  Well nourished, well developed in no acute distress HEENT: Normal NECK: No JVD; No carotid bruits CARDIAC: RRR, no murmurs, rubs,  gallops RESPIRATORY:  Clear to auscultation without rales, wheezing or rhonchi  ABDOMEN: Soft, non-tender, non-distended MUSCULOSKELETAL:  No edema; No deformity  SKIN: Warm and dry NEUROLOGIC:  Alert and oriented x 3 PSYCHIATRIC:  Normal affect   ASSESSMENT:    1. Incomplete RBBB   2. Smoking    PLAN:    In order of problems listed above:  Incomplete right bundle branch block noted on  EKG, no findings to indicate use of beta-blocker.  No indication to refill propranolol.  Patient denies palpitations.  No significant arrhythmias or ectopy noted on EKG in the ED and also today.  Obtain echo to rule out any structural abnormalities.  Patient is otherwise asymptomatic. Current smoker, smoking cessation advised.  Follow-up after echocardiogram or as needed if echo has no significant abnormalities.      Medication Adjustments/Labs and Tests Ordered: Current medicines are reviewed at length with the patient today.  Concerns regarding medicines are outlined above.  Orders Placed This Encounter  Procedures   EKG 12-Lead   ECHOCARDIOGRAM COMPLETE   No orders of the defined types were placed in this encounter.   Patient Instructions  Medication Instructions:   Your physician recommends that you continue on your current medications as directed. Please refer to the Current Medication list given to you today.  *If you need a refill on your cardiac medications before your next appointment, please call your pharmacy*   Lab Work:  None ordered  If you have labs (blood work) drawn today and your tests are completely normal, you will receive your results only by: MyChart Message (if you have MyChart) OR A paper copy in the mail If you have any lab test that is abnormal or we need to change your treatment, we will call you to review the results.   Testing/Procedures:  Your physician has requested that you have an echocardiogram. Echocardiography is a painless test that uses sound  waves to create images of your heart. It provides your doctor with information about the size and shape of your heart and how well your heart's chambers and valves are working. This procedure takes approximately one hour. There are no restrictions for this procedure. Please do NOT wear cologne, perfume, aftershave, or lotions (deodorant is allowed). Please arrive 15 minutes prior to your appointment time.    Follow-Up: At [facility], you and your health needs are our priority.  As part of our continuing mission to provide you with exceptional heart care, we have created designated Provider Care Teams.  These Care Teams include your primary Cardiologist (physician) and Advanced Practice Providers (APPs -  Physician Assistants and Nurse Practitioners) who all work together to provide you with the care you need, when you need it.  We recommend signing up for the patient portal called "MyChart".  Sign up information is provided on this After Visit Summary.  MyChart is used to connect with patients for Virtual Visits (Telemedicine).  Patients are able to view lab/test results, encounter notes, upcoming appointments, etc.  Non-urgent messages can be sent to your provider as well.   To learn more about what you can do with MyChart, go to [email or web address].    Your next appointment:    As needed    Signed, Debbe Odea, MD  10/11/2022 10:23 AM    [facility]

## 2022-10-30 ENCOUNTER — Other Ambulatory Visit: Payer: Self-pay | Admitting: Cardiology

## 2022-10-30 DIAGNOSIS — I4510 Unspecified right bundle-branch block: Secondary | ICD-10-CM

## 2022-10-30 DIAGNOSIS — F17200 Nicotine dependence, unspecified, uncomplicated: Secondary | ICD-10-CM

## 2022-10-31 DIAGNOSIS — F909 Attention-deficit hyperactivity disorder, unspecified type: Secondary | ICD-10-CM | POA: Diagnosis not present

## 2022-11-07 ENCOUNTER — Ambulatory Visit: Payer: 59

## 2022-11-09 ENCOUNTER — Ambulatory Visit: Payer: 59 | Attending: Cardiology

## 2022-11-09 ENCOUNTER — Other Ambulatory Visit: Payer: Self-pay | Admitting: Cardiology

## 2022-11-09 DIAGNOSIS — I4510 Unspecified right bundle-branch block: Secondary | ICD-10-CM | POA: Diagnosis not present

## 2022-11-09 DIAGNOSIS — F17200 Nicotine dependence, unspecified, uncomplicated: Secondary | ICD-10-CM

## 2022-11-09 LAB — ECHOCARDIOGRAM COMPLETE
Area-P 1/2: 4.39 cm2
S' Lateral: 3.4 cm

## 2023-01-30 DIAGNOSIS — F1920 Other psychoactive substance dependence, uncomplicated: Secondary | ICD-10-CM | POA: Diagnosis not present

## 2023-04-03 DIAGNOSIS — F1920 Other psychoactive substance dependence, uncomplicated: Secondary | ICD-10-CM | POA: Diagnosis not present
